# Patient Record
Sex: Male | Born: 1950
Health system: Southern US, Community
[De-identification: ages and names within clinical notes are randomized; demographics above are authoritative.]

## PROBLEM LIST (undated history)

## (undated) DIAGNOSIS — I255 Ischemic cardiomyopathy: Secondary | ICD-10-CM

## (undated) DIAGNOSIS — Z8673 Personal history of transient ischemic attack (TIA), and cerebral infarction without residual deficits: Secondary | ICD-10-CM

## (undated) DIAGNOSIS — I2109 ST elevation (STEMI) myocardial infarction involving other coronary artery of anterior wall: Secondary | ICD-10-CM

## (undated) DIAGNOSIS — I472 Ventricular tachycardia, unspecified: Secondary | ICD-10-CM

## (undated) DIAGNOSIS — I4891 Unspecified atrial fibrillation: Secondary | ICD-10-CM

## (undated) DIAGNOSIS — R570 Cardiogenic shock: Secondary | ICD-10-CM

## (undated) DIAGNOSIS — I509 Heart failure, unspecified: Secondary | ICD-10-CM

## (undated) DIAGNOSIS — E785 Hyperlipidemia, unspecified: Secondary | ICD-10-CM

## (undated) DIAGNOSIS — N200 Calculus of kidney: Secondary | ICD-10-CM

## (undated) DIAGNOSIS — I2510 Atherosclerotic heart disease of native coronary artery without angina pectoris: Secondary | ICD-10-CM

## (undated) HISTORY — DX: Ventricular tachycardia: I47.2

## (undated) HISTORY — DX: ST elevation (STEMI) myocardial infarction involving other coronary artery of anterior wall: I21.09

## (undated) HISTORY — DX: Hyperlipidemia, unspecified: E78.5

## (undated) HISTORY — DX: Calculus of kidney: N20.0

## (undated) HISTORY — DX: Ventricular tachycardia, unspecified: I47.20

## (undated) HISTORY — DX: Ischemic cardiomyopathy: I25.5

## (undated) HISTORY — DX: Heart failure, unspecified: I50.9

## (undated) HISTORY — DX: Cardiogenic shock: R57.0

## (undated) HISTORY — DX: Personal history of transient ischemic attack (TIA), and cerebral infarction without residual deficits: Z86.73

## (undated) HISTORY — DX: Unspecified atrial fibrillation: I48.91

---

## 2001-12-04 ENCOUNTER — Encounter: Admission: RE | Admit: 2001-12-04 | Discharge: 2001-12-04 | Payer: Self-pay | Admitting: Family Medicine

## 2001-12-04 ENCOUNTER — Encounter: Payer: Self-pay | Admitting: Family Medicine

## 2002-05-16 ENCOUNTER — Encounter: Payer: Self-pay | Admitting: Family Medicine

## 2002-05-16 ENCOUNTER — Encounter: Admission: RE | Admit: 2002-05-16 | Discharge: 2002-05-16 | Payer: Self-pay | Admitting: Family Medicine

## 2003-01-05 ENCOUNTER — Encounter: Admission: RE | Admit: 2003-01-05 | Discharge: 2003-01-05 | Payer: Self-pay | Admitting: Family Medicine

## 2003-01-05 ENCOUNTER — Encounter: Payer: Self-pay | Admitting: Family Medicine

## 2003-01-29 ENCOUNTER — Emergency Department (HOSPITAL_COMMUNITY): Admission: EM | Admit: 2003-01-29 | Discharge: 2003-01-29 | Payer: Self-pay | Admitting: Emergency Medicine

## 2008-08-19 ENCOUNTER — Ambulatory Visit: Payer: Self-pay | Admitting: Pulmonary Disease

## 2008-08-19 ENCOUNTER — Inpatient Hospital Stay (HOSPITAL_COMMUNITY): Admission: EM | Admit: 2008-08-19 | Discharge: 2008-08-28 | Payer: Self-pay | Admitting: Emergency Medicine

## 2008-08-19 ENCOUNTER — Ambulatory Visit: Payer: Self-pay | Admitting: Internal Medicine

## 2008-08-27 ENCOUNTER — Encounter (INDEPENDENT_AMBULATORY_CARE_PROVIDER_SITE_OTHER): Payer: Self-pay | Admitting: Internal Medicine

## 2008-09-25 ENCOUNTER — Ambulatory Visit: Payer: Self-pay | Admitting: Internal Medicine

## 2008-09-25 ENCOUNTER — Encounter (INDEPENDENT_AMBULATORY_CARE_PROVIDER_SITE_OTHER): Payer: Self-pay | Admitting: *Deleted

## 2008-09-25 ENCOUNTER — Encounter: Payer: Self-pay | Admitting: Internal Medicine

## 2008-09-25 DIAGNOSIS — R05 Cough: Secondary | ICD-10-CM

## 2008-09-25 DIAGNOSIS — E785 Hyperlipidemia, unspecified: Secondary | ICD-10-CM | POA: Insufficient documentation

## 2008-09-25 DIAGNOSIS — I2109 ST elevation (STEMI) myocardial infarction involving other coronary artery of anterior wall: Secondary | ICD-10-CM | POA: Insufficient documentation

## 2008-09-25 DIAGNOSIS — R059 Cough, unspecified: Secondary | ICD-10-CM | POA: Insufficient documentation

## 2008-09-25 DIAGNOSIS — I255 Ischemic cardiomyopathy: Secondary | ICD-10-CM | POA: Insufficient documentation

## 2008-10-16 ENCOUNTER — Telehealth (INDEPENDENT_AMBULATORY_CARE_PROVIDER_SITE_OTHER): Payer: Self-pay | Admitting: *Deleted

## 2008-10-20 ENCOUNTER — Ambulatory Visit: Payer: Self-pay | Admitting: Internal Medicine

## 2008-10-21 ENCOUNTER — Encounter: Payer: Self-pay | Admitting: Internal Medicine

## 2008-10-22 ENCOUNTER — Ambulatory Visit: Payer: Self-pay | Admitting: Internal Medicine

## 2008-10-22 DIAGNOSIS — R0602 Shortness of breath: Secondary | ICD-10-CM | POA: Insufficient documentation

## 2008-10-22 LAB — CONVERTED CEMR LAB
Basophils Relative: 0.8 % (ref 0.0–3.0)
CO2: 27 meq/L (ref 19–32)
Calcium: 9 mg/dL (ref 8.4–10.5)
Chloride: 103 meq/L (ref 96–112)
Eosinophils Absolute: 0.2 10*3/uL (ref 0.0–0.7)
Eosinophils Relative: 3.6 % (ref 0.0–5.0)
Hemoglobin: 13.2 g/dL (ref 13.0–17.0)
INR: 1 (ref 0.8–1.0)
Lymphocytes Relative: 43.6 % (ref 12.0–46.0)
MCHC: 34.2 g/dL (ref 30.0–36.0)
Neutro Abs: 2.3 10*3/uL (ref 1.4–7.7)
Neutrophils Relative %: 43.9 % (ref 43.0–77.0)
Prothrombin Time: 10.8 s — ABNORMAL LOW (ref 10.9–13.3)
RBC: 3.95 M/uL — ABNORMAL LOW (ref 4.22–5.81)
Sodium: 136 meq/L (ref 135–145)
WBC: 5.3 10*3/uL (ref 4.5–10.5)

## 2008-10-29 ENCOUNTER — Inpatient Hospital Stay (HOSPITAL_COMMUNITY): Admission: RE | Admit: 2008-10-29 | Discharge: 2008-10-30 | Payer: Self-pay | Admitting: Internal Medicine

## 2008-10-29 ENCOUNTER — Ambulatory Visit: Payer: Self-pay | Admitting: Internal Medicine

## 2008-10-30 ENCOUNTER — Encounter: Payer: Self-pay | Admitting: Internal Medicine

## 2008-10-30 HISTORY — PX: OTHER SURGICAL HISTORY: SHX169

## 2008-11-03 ENCOUNTER — Encounter: Payer: Self-pay | Admitting: Internal Medicine

## 2008-11-03 ENCOUNTER — Ambulatory Visit: Payer: Self-pay | Admitting: Internal Medicine

## 2008-11-03 ENCOUNTER — Telehealth: Payer: Self-pay | Admitting: Internal Medicine

## 2008-11-03 ENCOUNTER — Ambulatory Visit: Payer: Self-pay

## 2008-11-03 ENCOUNTER — Ambulatory Visit (HOSPITAL_COMMUNITY): Admission: RE | Admit: 2008-11-03 | Discharge: 2008-11-03 | Payer: Self-pay | Admitting: Internal Medicine

## 2008-11-05 ENCOUNTER — Telehealth: Payer: Self-pay | Admitting: Internal Medicine

## 2008-11-12 ENCOUNTER — Encounter: Payer: Self-pay | Admitting: Internal Medicine

## 2008-11-12 ENCOUNTER — Ambulatory Visit: Payer: Self-pay

## 2008-11-25 ENCOUNTER — Ambulatory Visit: Payer: Self-pay

## 2009-02-09 ENCOUNTER — Ambulatory Visit: Payer: Self-pay | Admitting: Internal Medicine

## 2009-04-21 ENCOUNTER — Encounter (INDEPENDENT_AMBULATORY_CARE_PROVIDER_SITE_OTHER): Payer: Self-pay | Admitting: *Deleted

## 2009-05-20 ENCOUNTER — Encounter: Payer: Self-pay | Admitting: Internal Medicine

## 2009-05-31 ENCOUNTER — Encounter: Payer: Self-pay | Admitting: Internal Medicine

## 2009-06-24 ENCOUNTER — Encounter: Payer: Self-pay | Admitting: Internal Medicine

## 2009-06-24 ENCOUNTER — Ambulatory Visit: Payer: Self-pay

## 2009-10-12 ENCOUNTER — Ambulatory Visit: Payer: Self-pay | Admitting: Internal Medicine

## 2009-12-30 ENCOUNTER — Telehealth (INDEPENDENT_AMBULATORY_CARE_PROVIDER_SITE_OTHER): Payer: Self-pay | Admitting: *Deleted

## 2010-01-13 ENCOUNTER — Ambulatory Visit: Payer: Self-pay | Admitting: Internal Medicine

## 2010-01-24 ENCOUNTER — Encounter: Payer: Self-pay | Admitting: Internal Medicine

## 2010-04-07 ENCOUNTER — Ambulatory Visit: Payer: Self-pay

## 2010-04-07 ENCOUNTER — Ambulatory Visit: Payer: Self-pay | Admitting: Internal Medicine

## 2010-07-07 ENCOUNTER — Encounter (INDEPENDENT_AMBULATORY_CARE_PROVIDER_SITE_OTHER): Payer: Self-pay

## 2010-07-07 ENCOUNTER — Ambulatory Visit: Admit: 2010-07-07 | Payer: Self-pay | Admitting: Internal Medicine

## 2010-07-07 ENCOUNTER — Encounter: Payer: Self-pay | Admitting: Internal Medicine

## 2010-07-07 DIAGNOSIS — I428 Other cardiomyopathies: Secondary | ICD-10-CM

## 2010-07-07 NOTE — Progress Notes (Signed)
  DDS request recieved sent to [facility]. Cala Bradford Mesiemore  December 30, 2009 12:23 PM

## 2010-07-07 NOTE — Procedures (Signed)
Summary: defib check.[email or web address]   Current Medications (verified): 1)  Zocor 80 Mg Tabs (Simvastatin) .[name].. 1 Tab Once Daily 2)  Carvedilol 12.5 Mg Tabs (Carvedilol) .... Two Times A Day 3)  Potassium Chloride Crys Cr 20 Meq Cr-Tabs (Potassium Chloride Crys Cr) .[name].. 1 Tab Once Daily 4)  Lasix 20 Mg Tabs (Furosemide) .[name].. 1 Tab Once Daily 5)  Aspirin 325 Mg Tabs (Aspirin) .... Once Daily 6)  Inspra 25 Mg Tabs (Eplerenone) .... One By Mouth Daily 7)  Benazepril Hcl 40 Mg Tabs (Benazepril Hcl) .... One By Mouth Daily  Allergies (verified): No Known Drug Allergies   ICD Specifications Following MD:  Sherryl Manges, MD     Referring MD:  [facility] ICD Vendor:  St Jude     ICD Model Number:  [id number]     ICD Serial Number:  [id number] ICD DOI:  10/29/2008     ICD Implanting MD:  Sherryl Manges, MD Research Study Name SJ4  Lead 1:    Location: RA     DOI: 10/29/2008     Model #: [id number]     Serial #: [id number]     Status: active Lead 2:    Location: RV     DOI: 10/29/2008     Model #: [id number]     Serial #: [id number]     Status: active  Indications::  VT/VF arrest   ICD Follow Up Remote Check?  No Battery Voltage:  3.20 V     Charge Time:  11.3 seconds     Battery Est. Longevity:  6.9 years Underlying rhythm:  SR ICD Dependent:  No       ICD Device Measurements Atrium:  Amplitude: 1.0 mV, Impedance: 430 ohms, Threshold: 0.5 V at 0.5 msec Right Ventricle:  Amplitude: 11.3 mV, Impedance: 460 ohms, Threshold: 0.75 V at 0.5 msec Shock Impedance: 75 ohms   Episodes MS Episodes:  0     Percent Mode Switch:  0     Coumadin:  No Shock:  0     ATP:  0     Nonsustained:  0     Atrial Pacing:  38%     Ventricular Pacing:  12%  Brady Parameters Mode DDI     Lower Rate Limit:  50     Upper Rate Limit 120 PAV 250      Tachy Zones VF:  240     VT:  200     Tech Comments:  No parameter changes.  Device function normal.  Checked by [employer] for research.  Merlin transmission in 3 months with ROV 6 months with  Dr. Graciela Husbands. Altha Harm, LPN  April 07, 2010 3:48 PM

## 2010-07-07 NOTE — Cardiovascular Report (Signed)
Summary: Office Visit   Office Visit   Imported By: Roderic Ovens 07/07/2009 13:57:52  _____________________________________________________________________  External Attachment:    Type:   Image     Comment:   External Document

## 2010-07-07 NOTE — Procedures (Signed)
Summary: rov/jml   Current Medications (verified): 1)  Zocor 80 Mg Tabs (Simvastatin) .[name].. 1 Tab Once Daily 2)  Carvedilol 6.25 Mg Tabs (Carvedilol) .... Two Times A Day 3)  Potassium Chloride Crys Cr 20 Meq Cr-Tabs (Potassium Chloride Crys Cr) .[name].. 1 Tab Once Daily 4)  Lasix 20 Mg Tabs (Furosemide) .[name].. 1 Tab Once Daily 5)  Diovan 40 Mg Tabs (Valsartan) .... Take 1/2  Tablet By Mouth Two Times A Day 6)  Aspirin 325 Mg Tabs (Aspirin) .... Once Daily  Allergies (verified): No Known Drug Allergies    ICD Specifications Following MD:  Sherryl Manges, MD     ICD Vendor:  St Jude     ICD Model Number:  [phone]     ICD Serial Number:  [id number] ICD DOI:  10/29/2008     ICD Implanting MD:  Sherryl Manges, MD  Lead 1:    Location: RA     DOI: 10/29/2008     Model #: [id number]     Serial #: [id number]     Status: active Lead 2:    Location: RV     DOI: 10/29/2008     Model #: [id number]     Serial #: [id number]     Status: active  ICD Follow Up Remote Check?  No Battery Voltage:  3.20 V     Charge Time:  11.0 seconds     Battery Est. Longevity:  7.0 years Underlying rhythm:  SR ICD Dependent:  No       ICD Device Measurements Atrium:  Amplitude: 1.8 mV, Impedance: 490 ohms, Threshold: 0.75 V at 0.4 msec Right Ventricle:  Amplitude: 11.3 mV, Impedance: 480 ohms, Threshold: 0.75 V at 0.4 msec  Episodes MS Episodes:  2     Percent Mode Switch:  <1%     Coumadin:  No Shock:  0     ATP:  0     Nonsustained:  0     Atrial Pacing:  25%     Ventricular Pacing:  2.8%  Brady Parameters Mode DDI     Lower Rate Limit:  50     Upper Rate Limit 120 PAV 250      Tachy Zones VF:  240     VT:  200     Next Remote Date:  09/20/2009     Next Cardiology Appt Due:  02/03/2010 Tech Comments:  No parameter changes.  Device function normal.  Merlin transmissions every 3 months. 2 AT/AF episodes both < 1 minute.  The patient is on Plavix.    ROV 9/11 with Dr. Graciela Husbands. Altha Harm, LPN  June 24, 2009 12:22 PM

## 2010-07-07 NOTE — Letter (Signed)
Summary: Remote Device Check  [employer], Main Office  [address]   [place], [place] [address]   Phone: [phone]  Fax: [phone]     January 24, 2010 MRN: [id number]   SAMIER JACO [address] [place], [place]  [address]   Dear Mr. Hamrick,   Your remote transmission was recieved and reviewed by your physician.  All diagnostics were within normal limits for you.   __X____Your next office visit is scheduled for: November 2011 for device check. Please call our office to schedule an appointment.    Sincerely,  Vella Kohler

## 2010-07-07 NOTE — Assessment & Plan Note (Signed)
Summary: defib check.sjm.amber   Visit Type:  Pacemaker check Referring Provider:  Eldridge Dace Primary Provider:  Dr. Arvilla Market  CC:  sob at times..no other complaints today.  History of Present Illness: [name] is seen in followup for ventricular tachycardia/fibrillation presented as an out of hospital cardiac arrest. He simply underwent intervention of an occluded LAD but had persistent left ventricular dysfunction prompting ICD implantation.  Post procedural dyspnea was relieved by reprogramming to DDI mode  The patient denieschest pain, edema or palpitations; he has had some problems with dyspneaDr. Eldridge Dace is addressing this     Current Medications (verified): 1)  Zocor 80 Mg Tabs (Simvastatin) .Mike Cole.. 1 Tab Once Daily 2)  Carvedilol 6.25 Mg Tabs (Carvedilol) .... Two Times A Day 3)  Potassium Chloride Crys Cr 20 Meq Cr-Tabs (Potassium Chloride Crys Cr) .Mike Cole.. 1 Tab Once Daily 4)  Lasix 20 Mg Tabs (Furosemide) .Mike Cole.. 1 Tab Once Daily 5)  Diovan 40 Mg Tabs (Valsartan) .... Take 1/2  Tablet By Mouth Two Times A Day 6)  Aspirin 325 Mg Tabs (Aspirin) .... Once Daily  Allergies (verified): No Known Drug Allergies  Past History:  Past Medical History: Last updated: 10/08/2009 Ventricular tachycardia arrest, resuscitated.  St. Jude CURRENT + DR Acute anterior myocardial infarction with bare-metal stent to the       left anterior descending.  Cardiogenic shock, resolved.  Ischemic cardiomyopathy, ejection fraction of 20-25%.  Dyslipidemia History of transient ischemic attack  Vital Signs:  Patient profile:   60 year old male Height:      73 inches Weight:      216 pounds BMI:     28.60 Pulse rate:   61 / minute Pulse rhythm:   regular BP sitting:   131 / 82  (left arm) Cuff size:   large  Vitals Entered By: Danielle Rankin, CMA (Oct 12, 2009 9:55 AM)  Physical Exam  General:  The patient was alert and oriented in no acute distress. HEENT Normal.  Neck veins were flat,  carotids were brisk.  Lungs were clear.  Heart sounds were regular without murmurs or gallops.  Abdomen was soft with active bowel sounds. There is no clubbing cyanosis or edema. Skin Warm and dry     ICD Specifications Following MD:  Sherryl Manges, MD     Referring MD:  [facility] ICD Vendor:  St Jude     ICD Model Number:  [id number]     ICD Serial Number:  [id number] ICD DOI:  10/29/2008     ICD Implanting MD:  Sherryl Manges, MD Research Study Name SJ4  Lead 1:    Location: RA     DOI: 10/29/2008     Model #: [id number]     Serial #: [id number]     Status: active Lead 2:    Location: RV     DOI: 10/29/2008     Model #: [id number]     Serial #: [id number]     Status: active  ICD Follow Up Remote Check?  No Battery Voltage:  3.20 V     Charge Time:  11.1 seconds     Underlying rhythm:  SR ICD Dependent:  No       ICD Device Measurements Atrium:  Amplitude: 2.0 mV, Impedance: 450 ohms, Threshold: 0.75 V at 0.5 msec Right Ventricle:  Amplitude: 11.3 mV, Impedance: 450 ohms, Threshold: 0.75 V at 0.5 msec Shock Impedance: 79 ohms   Episodes MS Episodes:  0     Coumadin:  No  Shock:  0     ATP:  0     Nonsustained:  0     Atrial Pacing:  36%     Ventricular Pacing:  11%  Brady Parameters Mode DDI     Lower Rate Limit:  50     Upper Rate Limit 120 PAV 250      Tachy Zones VF:  240     VT:  200     Next Remote Date:  01/03/2010     Next Cardiology Appt Due:  04/05/2010 Tech Comments:  checked by industry for SJ4 study. Gypsy Balsam RN BSN  Oct 12, 2009 10:29 AM   Impression & Recommendations:  Problem # 1:  IMPLANTATION OF DEFIBRILLATOR, STJ DDD (ICD-V45.02) Assessment New Device parameters and data were reviewed and no changes were made  Problem # 2:  CARDIOMYOPATHY, ISCHEMIC (ICD-414.8)  stable on current meds  His updated medication list for this problem includes:    Carvedilol 6.25 Mg Tabs (Carvedilol) .Mike Cole..Mike Cole Two times a day    Lasix 20 Mg Tabs (Furosemide) .Mike Cole... 1 tab once daily    Diovan  40 Mg Tabs (Valsartan) .Mike Cole... Take 1/2  tablet by mouth two times a day    Aspirin 325 Mg Tabs (Aspirin) ..... Once daily  His updated medication list for this problem includes:    Carvedilol 6.25 Mg Tabs (Carvedilol) .Mike Cole..Mike Cole Two times a day    Lasix 20 Mg Tabs (Furosemide) .Mike Cole... 1 tab once daily    Diovan 40 Mg Tabs (Valsartan) .Mike Cole... Take 1/2  tablet by mouth two times a day    Aspirin 325 Mg Tabs (Aspirin) ..... Once daily  Problem # 3:  VENTRICULAR TACHYCARDIA-ARREST (ICD-427.1)  no intercurrent vt

## 2010-07-08 NOTE — Cardiovascular Report (Signed)
Summary: Office Visit Remote   Office Visit Remote   Imported By: Roderic Ovens 01/25/2010 15:36:36  _____________________________________________________________________  External Attachment:    Type:   Image     Comment:   External Document

## 2010-07-25 ENCOUNTER — Encounter (INDEPENDENT_AMBULATORY_CARE_PROVIDER_SITE_OTHER): Payer: Self-pay | Admitting: *Deleted

## 2010-08-02 NOTE — Cardiovascular Report (Signed)
Summary: Office Visit Remote   Office Visit Remote   Imported By: Roderic Ovens 07/29/2010 11:17:45  _____________________________________________________________________  External Attachment:    Type:   Image     Comment:   External Document

## 2010-08-02 NOTE — Letter (Signed)
Summary: Remote Device Check  [employer], Main Office  [address]   [place], [place] [address]   Phone: [phone]  Fax: [phone]     July 25, 2010 MRN: [id number]   Mike Cole [address] [place], [place]  [address]   Dear [name],   Your remote transmission was recieved and reviewed by your physician.  All diagnostics were within normal limits for you.  ___X___Your next office visit is scheduled for:  May 2012 with Dr Graciela Husbands. Please call our office to schedule an appointment.    Sincerely,  Vella Kohler

## 2010-09-03 IMAGING — CR DG CHEST 1V PORT
1 series · 1 of 1 positions shown · non-contrast
Comparison: 08/21/2008

CLINICAL DATA: Cardiac arrest, ventilatory support

PORTABLE CHEST - 1 VIEW

[AP]
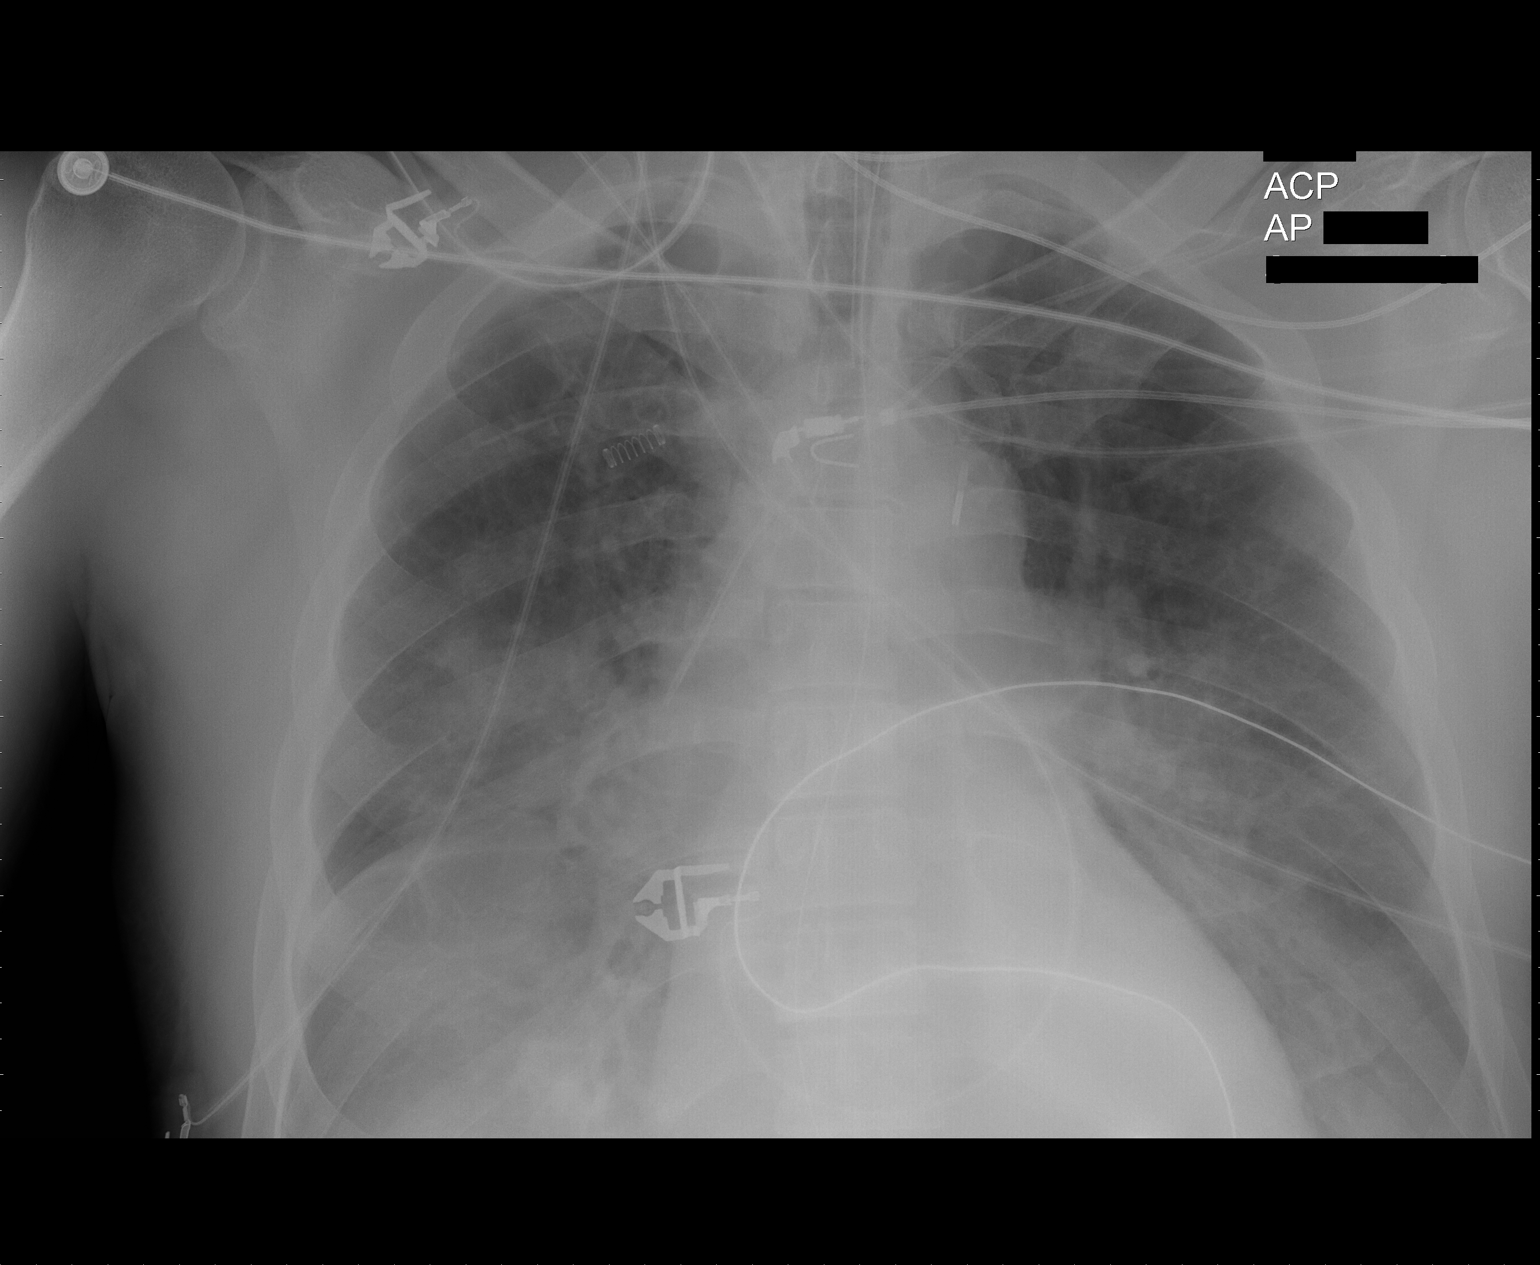

[1 of 1 positions shown; findings below may reference images not displayed]

FINDINGS: Stable support apparatus position.  No interval change.
Cardiomegaly persist with diffuse symmetric airspace disease versus
edema.  Pleural effusions are noted layering posteriorly.  No large
pneumothorax.  Chest exam is stable.
IMPRESSION: Stable airspace disease versus edema and pleural effusions.

## 2010-09-05 IMAGING — CR DG CHEST 1V PORT
1 series · 1 of 1 positions shown · non-contrast
Comparison: Portable exam 2252 hours compared to 08/23/2008

CLINICAL DATA: Cardiac arrest, Code stenting, status post
extubation, cough

PORTABLE CHEST - 1 VIEW

[AP]
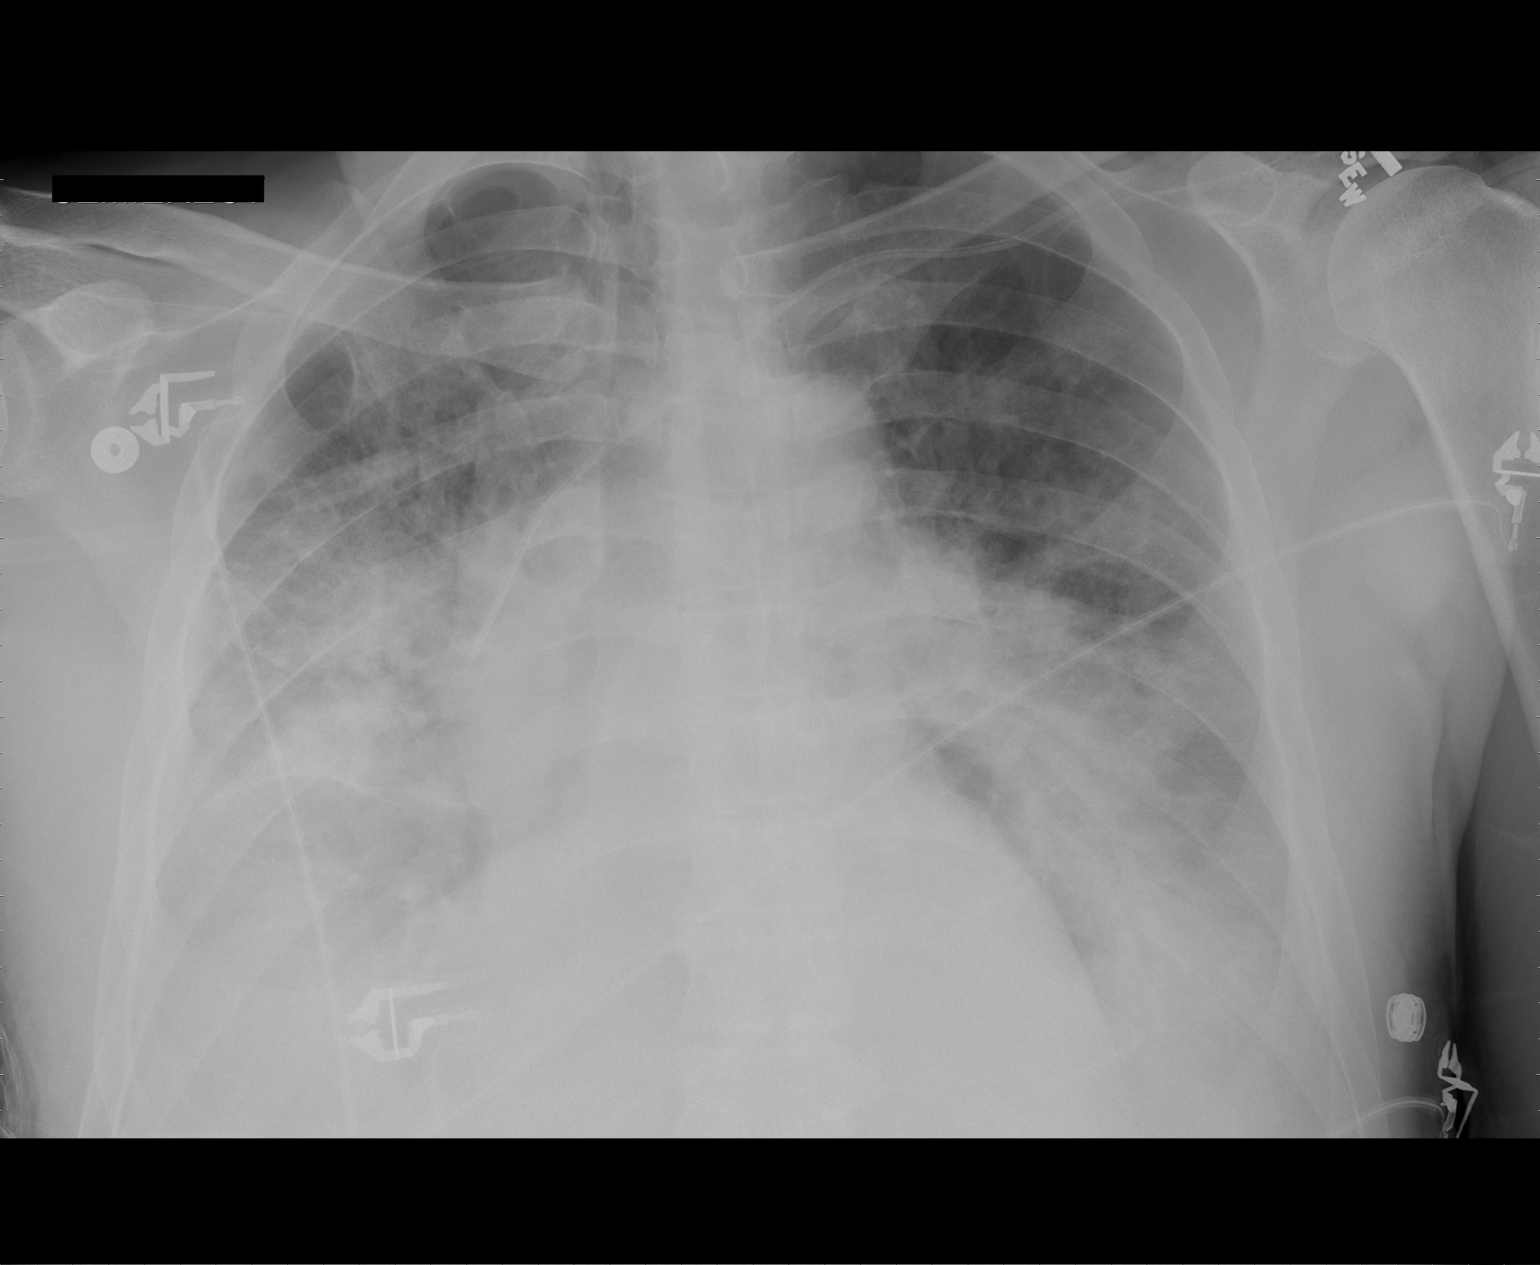

[1 of 1 positions shown; findings below may reference images not displayed]

FINDINGS: Endotracheal tube removed.
Nasogastric tube removed.
Left subclavian central venous catheter tip SVC.
Heart size stable.
Diffuse bilateral pulmonary infiltrates slightly greater on right,
question pulmonary edema versus infection.
Little interval change since prior exam.
No definite pneumothorax.
IMPRESSION: No interval change in diffuse bilateral pulmonary infiltrates.

## 2010-09-15 LAB — BASIC METABOLIC PANEL
BUN: 12 mg/dL (ref 6–23)
BUN: 12 mg/dL (ref 6–23)
BUN: 13 mg/dL (ref 6–23)
BUN: 14 mg/dL (ref 6–23)
BUN: 15 mg/dL (ref 6–23)
CO2: 17 mEq/L — ABNORMAL LOW (ref 19–32)
CO2: 19 mEq/L (ref 19–32)
CO2: 20 mEq/L (ref 19–32)
CO2: 20 mEq/L (ref 19–32)
CO2: 20 mEq/L (ref 19–32)
CO2: 20 mEq/L (ref 19–32)
CO2: 22 mEq/L (ref 19–32)
CO2: 22 mEq/L (ref 19–32)
CO2: 23 mEq/L (ref 19–32)
CO2: 24 mEq/L (ref 19–32)
CO2: 25 mEq/L (ref 19–32)
CO2: 26 mEq/L (ref 19–32)
Calcium: 7.3 mg/dL — ABNORMAL LOW (ref 8.4–10.5)
Calcium: 7.4 mg/dL — ABNORMAL LOW (ref 8.4–10.5)
Calcium: 7.5 mg/dL — ABNORMAL LOW (ref 8.4–10.5)
Calcium: 7.5 mg/dL — ABNORMAL LOW (ref 8.4–10.5)
Calcium: 7.5 mg/dL — ABNORMAL LOW (ref 8.4–10.5)
Calcium: 7.5 mg/dL — ABNORMAL LOW (ref 8.4–10.5)
Calcium: 7.6 mg/dL — ABNORMAL LOW (ref 8.4–10.5)
Calcium: 7.6 mg/dL — ABNORMAL LOW (ref 8.4–10.5)
Calcium: 8 mg/dL — ABNORMAL LOW (ref 8.4–10.5)
Chloride: 102 mEq/L (ref 96–112)
Chloride: 105 mEq/L (ref 96–112)
Chloride: 106 mEq/L (ref 96–112)
Chloride: 106 mEq/L (ref 96–112)
Chloride: 107 mEq/L (ref 96–112)
Chloride: 108 mEq/L (ref 96–112)
Chloride: 109 mEq/L (ref 96–112)
Chloride: 109 mEq/L (ref 96–112)
Chloride: 109 mEq/L (ref 96–112)
Chloride: 110 mEq/L (ref 96–112)
Chloride: 114 mEq/L — ABNORMAL HIGH (ref 96–112)
Creatinine, Ser: 0.92 mg/dL (ref 0.4–1.5)
Creatinine, Ser: 1 mg/dL (ref 0.4–1.5)
Creatinine, Ser: 1.03 mg/dL (ref 0.4–1.5)
Creatinine, Ser: 1.04 mg/dL (ref 0.4–1.5)
Creatinine, Ser: 1.1 mg/dL (ref 0.4–1.5)
Creatinine, Ser: 1.12 mg/dL (ref 0.4–1.5)
Creatinine, Ser: 1.12 mg/dL (ref 0.4–1.5)
Creatinine, Ser: 1.16 mg/dL (ref 0.4–1.5)
GFR calc Af Amer: >60 mL/min (ref >60)
GFR calc Af Amer: >60 mL/min (ref >60)
GFR calc Af Amer: >60 mL/min (ref >60)
GFR calc Af Amer: >60 mL/min (ref >60)
GFR calc Af Amer: >60 mL/min (ref >60)
GFR calc Af Amer: >60 mL/min (ref >60)
GFR calc Af Amer: >60 mL/min (ref >60)
GFR calc Af Amer: >60 mL/min (ref >60)
GFR calc Af Amer: >60 mL/min (ref >60)
GFR calc Af Amer: >60 mL/min (ref >60)
GFR calc Af Amer: >60 mL/min (ref >60)
GFR calc non Af Amer: >60 mL/min (ref >60)
GFR calc non Af Amer: >60 mL/min (ref >60)
GFR calc non Af Amer: >60 mL/min (ref >60)
GFR calc non Af Amer: >60 mL/min (ref >60)
GFR calc non Af Amer: >60 mL/min (ref >60)
Glucose, Bld: 105 mg/dL — ABNORMAL HIGH (ref 70–99)
Glucose, Bld: 107 mg/dL — ABNORMAL HIGH (ref 70–99)
Glucose, Bld: 189 mg/dL — ABNORMAL HIGH (ref 70–99)
Glucose, Bld: 191 mg/dL — ABNORMAL HIGH (ref 70–99)
Glucose, Bld: 208 mg/dL — ABNORMAL HIGH (ref 70–99)
Glucose, Bld: 229 mg/dL — ABNORMAL HIGH (ref 70–99)
Glucose, Bld: 230 mg/dL — ABNORMAL HIGH (ref 70–99)
Glucose, Bld: 243 mg/dL — ABNORMAL HIGH (ref 70–99)
Glucose, Bld: 90 mg/dL (ref 70–99)
Glucose, Bld: 95 mg/dL (ref 70–99)
Potassium: 3.2 mEq/L — ABNORMAL LOW (ref 3.5–5.1)
Potassium: 3.4 mEq/L — ABNORMAL LOW (ref 3.5–5.1)
Potassium: 3.4 mEq/L — ABNORMAL LOW (ref 3.5–5.1)
Potassium: 3.4 mEq/L — ABNORMAL LOW (ref 3.5–5.1)
Potassium: 3.5 mEq/L (ref 3.5–5.1)
Potassium: 3.8 mEq/L (ref 3.5–5.1)
Potassium: 4.4 mEq/L (ref 3.5–5.1)
Potassium: 5.2 mEq/L — ABNORMAL HIGH (ref 3.5–5.1)
Potassium: 5.2 mEq/L — ABNORMAL HIGH (ref 3.5–5.1)
Sodium: 128 mEq/L — ABNORMAL LOW (ref 135–145)
Sodium: 131 mEq/L — ABNORMAL LOW (ref 135–145)
Sodium: 132 mEq/L — ABNORMAL LOW (ref 135–145)
Sodium: 132 mEq/L — ABNORMAL LOW (ref 135–145)
Sodium: 135 mEq/L (ref 135–145)
Sodium: 135 mEq/L (ref 135–145)
Sodium: 136 mEq/L (ref 135–145)
Sodium: 136 mEq/L (ref 135–145)
Sodium: 138 mEq/L (ref 135–145)
Sodium: 138 mEq/L (ref 135–145)
Sodium: 142 mEq/L (ref 135–145)
Sodium: 142 mEq/L (ref 135–145)

## 2010-09-15 LAB — COMPREHENSIVE METABOLIC PANEL
ALT: 143 U/L — ABNORMAL HIGH (ref 0–53)
ALT: 230 U/L — ABNORMAL HIGH (ref 0–53)
AST: 163 U/L — ABNORMAL HIGH (ref 0–37)
AST: 290 U/L — ABNORMAL HIGH (ref 0–37)
Albumin: 2.3 g/dL — ABNORMAL LOW (ref 3.5–5.2)
Alkaline Phosphatase: 98 U/L (ref 39–117)
CO2: 18 mEq/L — ABNORMAL LOW (ref 19–32)
CO2: 24 mEq/L (ref 19–32)
Calcium: 7.3 mg/dL — ABNORMAL LOW (ref 8.4–10.5)
Calcium: 8.4 mg/dL (ref 8.4–10.5)
GFR calc Af Amer: >60 mL/min (ref >60)
GFR calc Af Amer: >60 mL/min (ref >60)
GFR calc non Af Amer: 51 mL/min — ABNORMAL LOW (ref >60)
GFR calc non Af Amer: >60 mL/min (ref >60)
Glucose, Bld: 308 mg/dL — ABNORMAL HIGH (ref 70–99)
Potassium: 4.2 mEq/L (ref 3.5–5.1)
Sodium: 129 mEq/L — ABNORMAL LOW (ref 135–145)
Sodium: 137 mEq/L (ref 135–145)
Total Protein: 4.2 g/dL — ABNORMAL LOW (ref 6.0–8.3)

## 2010-09-15 LAB — BLOOD GAS, ARTERIAL
Acid-base deficit: 3.2 mmol/L — ABNORMAL HIGH (ref 0.0–2.0)
Acid-base deficit: 3.8 mmol/L — ABNORMAL HIGH (ref 0.0–2.0)
Drawn by: 296031
FIO2: 0.4 %
FIO2: 0.4 %
MECHVT: 550 mL
MECHVT: 550 mL
MECHVT: 550 mL
O2 Saturation: 95.7 %
O2 Saturation: 99.3 %
PEEP: 5 cmH2O
PEEP: 5 cmH2O
PEEP: 8 cmH2O
Patient temperature: 91.4
RATE: 18 resp/min
RATE: 18 resp/min
TCO2: 19.9 mmol/L (ref 0–100)
pCO2 arterial: 29.3 mmHg — ABNORMAL LOW (ref 35.0–45.0)
pCO2 arterial: 32.1 mmHg — ABNORMAL LOW (ref 35.0–45.0)
pH, Arterial: 7.37 (ref 7.350–7.450)
pO2, Arterial: 74.4 mmHg — ABNORMAL LOW (ref 80.0–100.0)

## 2010-09-15 LAB — MAGNESIUM
Magnesium: 1.5 mg/dL (ref 1.5–2.5)
Magnesium: 1.6 mg/dL (ref 1.5–2.5)
Magnesium: 1.6 mg/dL (ref 1.5–2.5)
Magnesium: 1.7 mg/dL (ref 1.5–2.5)
Magnesium: 1.8 mg/dL (ref 1.5–2.5)
Magnesium: 2 mg/dL (ref 1.5–2.5)
Magnesium: 2 mg/dL (ref 1.5–2.5)
Magnesium: 2.1 mg/dL (ref 1.5–2.5)
Magnesium: 2.4 mg/dL (ref 1.5–2.5)

## 2010-09-15 LAB — POCT I-STAT 3, ART BLOOD GAS (G3+)
Acid-base deficit: 6 mmol/L — ABNORMAL HIGH (ref 0.0–2.0)
Bicarbonate: 17.7 mEq/L — ABNORMAL LOW (ref 20.0–24.0)
O2 Saturation: 99 %
Patient temperature: 97
TCO2: 19 mmol/L (ref 0–100)
pCO2 arterial: 36.3 mmHg (ref 35.0–45.0)
pH, Arterial: 7.26 — ABNORMAL LOW (ref 7.350–7.450)

## 2010-09-15 LAB — CBC
HCT: 28.2 % — ABNORMAL LOW (ref 39.0–52.0)
HCT: 28.3 % — ABNORMAL LOW (ref 39.0–52.0)
HCT: 30.2 % — ABNORMAL LOW (ref 39.0–52.0)
HCT: 31.9 % — ABNORMAL LOW (ref 39.0–52.0)
HCT: 33.7 % — ABNORMAL LOW (ref 39.0–52.0)
HCT: 36.1 % — ABNORMAL LOW (ref 39.0–52.0)
HCT: 43.5 % (ref 39.0–52.0)
HCT: 44 % (ref 39.0–52.0)
Hemoglobin: 10 g/dL — ABNORMAL LOW (ref 13.0–17.0)
Hemoglobin: 10.5 g/dL — ABNORMAL LOW (ref 13.0–17.0)
Hemoglobin: 11.2 g/dL — ABNORMAL LOW (ref 13.0–17.0)
Hemoglobin: 11.7 g/dL — ABNORMAL LOW (ref 13.0–17.0)
Hemoglobin: 15.1 g/dL (ref 13.0–17.0)
Hemoglobin: 15.3 g/dL (ref 13.0–17.0)
Hemoglobin: 9.9 g/dL — ABNORMAL LOW (ref 13.0–17.0)
MCHC: 34.3 g/dL (ref 30.0–36.0)
MCHC: 34.7 g/dL (ref 30.0–36.0)
MCHC: 34.7 g/dL (ref 30.0–36.0)
MCHC: 34.9 g/dL (ref 30.0–36.0)
MCHC: 34.9 g/dL (ref 30.0–36.0)
MCHC: 35.2 g/dL (ref 30.0–36.0)
MCHC: 35.4 g/dL (ref 30.0–36.0)
MCHC: 35.5 g/dL (ref 30.0–36.0)
MCV: 95.9 fL (ref 78.0–100.0)
MCV: 97 fL (ref 78.0–100.0)
MCV: 97.5 fL (ref 78.0–100.0)
MCV: 97.9 fL (ref 78.0–100.0)
MCV: 98.1 fL (ref 78.0–100.0)
Platelets: 101 10*3/uL — ABNORMAL LOW (ref 150–400)
Platelets: 143 10*3/uL — ABNORMAL LOW (ref 150–400)
Platelets: 207 10*3/uL (ref 150–400)
RBC: 3.11 MIL/uL — ABNORMAL LOW (ref 4.22–5.81)
RBC: 3.19 MIL/uL — ABNORMAL LOW (ref 4.22–5.81)
RBC: 3.33 MIL/uL — ABNORMAL LOW (ref 4.22–5.81)
RBC: 3.98 MIL/uL — ABNORMAL LOW (ref 4.22–5.81)
RBC: 4.49 MIL/uL (ref 4.22–5.81)
RBC: 4.49 MIL/uL (ref 4.22–5.81)
RDW: 13.4 % (ref 11.5–15.5)
RDW: 13.5 % (ref 11.5–15.5)
RDW: 13.6 % (ref 11.5–15.5)
RDW: 13.6 % (ref 11.5–15.5)
RDW: 13.6 % (ref 11.5–15.5)
RDW: 13.6 % (ref 11.5–15.5)
RDW: 13.6 % (ref 11.5–15.5)
RDW: 13.9 % (ref 11.5–15.5)
WBC: 10.1 10*3/uL (ref 4.0–10.5)
WBC: 10.4 10*3/uL (ref 4.0–10.5)
WBC: 10.7 10*3/uL — ABNORMAL HIGH (ref 4.0–10.5)
WBC: 12.4 10*3/uL — ABNORMAL HIGH (ref 4.0–10.5)
WBC: 9.4 10*3/uL (ref 4.0–10.5)

## 2010-09-15 LAB — URINE CULTURE: Colony Count: >=100000

## 2010-09-15 LAB — URINALYSIS, ROUTINE W REFLEX MICROSCOPIC
Glucose, UA: NEGATIVE mg/dL
Ketones, ur: NEGATIVE mg/dL
Leukocytes, UA: NEGATIVE
Protein, ur: NEGATIVE mg/dL

## 2010-09-15 LAB — CARDIAC PANEL(CRET KIN+CKTOT+MB+TROPI)
CK, MB: 438.3 ng/mL — ABNORMAL HIGH (ref 0.3–4.0)
Total CK: 3030 U/L — ABNORMAL HIGH (ref 7–232)

## 2010-09-15 LAB — HEPARIN LEVEL (UNFRACTIONATED)
Heparin Unfractionated: 0.11 IU/mL — ABNORMAL LOW (ref 0.30–0.70)
Heparin Unfractionated: 0.29 IU/mL — ABNORMAL LOW (ref 0.30–0.70)
Heparin Unfractionated: 0.32 IU/mL (ref 0.30–0.70)

## 2010-09-15 LAB — CULTURE, BLOOD (ROUTINE X 2)

## 2010-09-15 LAB — DIFFERENTIAL
Basophils Relative: 1 % (ref 0–1)
Eosinophils Absolute: 0.2 10*3/uL (ref 0.0–0.7)
Eosinophils Relative: 2 % (ref 0–5)
Lymphs Abs: 4.4 10*3/uL — ABNORMAL HIGH (ref 0.7–4.0)
Monocytes Relative: 4 % (ref 3–12)
Neutrophils Relative %: 47 % (ref 43–77)

## 2010-09-15 LAB — GLUCOSE, CAPILLARY
Glucose-Capillary: 100 mg/dL — ABNORMAL HIGH (ref 70–99)
Glucose-Capillary: 102 mg/dL — ABNORMAL HIGH (ref 70–99)
Glucose-Capillary: 108 mg/dL — ABNORMAL HIGH (ref 70–99)
Glucose-Capillary: 148 mg/dL — ABNORMAL HIGH (ref 70–99)
Glucose-Capillary: 183 mg/dL — ABNORMAL HIGH (ref 70–99)
Glucose-Capillary: 185 mg/dL — ABNORMAL HIGH (ref 70–99)
Glucose-Capillary: 190 mg/dL — ABNORMAL HIGH (ref 70–99)
Glucose-Capillary: 190 mg/dL — ABNORMAL HIGH (ref 70–99)
Glucose-Capillary: 198 mg/dL — ABNORMAL HIGH (ref 70–99)
Glucose-Capillary: 210 mg/dL — ABNORMAL HIGH (ref 70–99)
Glucose-Capillary: 225 mg/dL — ABNORMAL HIGH (ref 70–99)
Glucose-Capillary: 227 mg/dL — ABNORMAL HIGH (ref 70–99)
Glucose-Capillary: 58 mg/dL — ABNORMAL LOW (ref 70–99)
Glucose-Capillary: 63 mg/dL — ABNORMAL LOW (ref 70–99)
Glucose-Capillary: 63 mg/dL — ABNORMAL LOW (ref 70–99)
Glucose-Capillary: 66 mg/dL — ABNORMAL LOW (ref 70–99)
Glucose-Capillary: 66 mg/dL — ABNORMAL LOW (ref 70–99)
Glucose-Capillary: 68 mg/dL — ABNORMAL LOW (ref 70–99)
Glucose-Capillary: 72 mg/dL (ref 70–99)
Glucose-Capillary: 72 mg/dL (ref 70–99)
Glucose-Capillary: 75 mg/dL (ref 70–99)
Glucose-Capillary: 79 mg/dL (ref 70–99)
Glucose-Capillary: 79 mg/dL (ref 70–99)
Glucose-Capillary: 88 mg/dL (ref 70–99)
Glucose-Capillary: 93 mg/dL (ref 70–99)
Glucose-Capillary: 94 mg/dL (ref 70–99)
Glucose-Capillary: 97 mg/dL (ref 70–99)

## 2010-09-15 LAB — PHOSPHORUS
Phosphorus: 1.7 mg/dL — ABNORMAL LOW (ref 2.3–4.6)
Phosphorus: 1.8 mg/dL — ABNORMAL LOW (ref 2.3–4.6)
Phosphorus: 2.2 mg/dL — ABNORMAL LOW (ref 2.3–4.6)
Phosphorus: 4 mg/dL (ref 2.3–4.6)
Phosphorus: 4.7 mg/dL — ABNORMAL HIGH (ref 2.3–4.6)

## 2010-09-15 LAB — APTT
aPTT: 47 seconds — ABNORMAL HIGH (ref 24–37)
aPTT: 73 seconds — ABNORMAL HIGH (ref 24–37)

## 2010-09-15 LAB — CULTURE, BAL-QUANTITATIVE W GRAM STAIN: Colony Count: >=100000

## 2010-09-15 LAB — PROTIME-INR
INR: 1 (ref 0.00–1.49)
INR: 1.2 (ref 0.00–1.49)
Prothrombin Time: 13.7 seconds (ref 11.6–15.2)
Prothrombin Time: 15.1 seconds (ref 11.6–15.2)

## 2010-09-15 LAB — URINE MICROSCOPIC-ADD ON

## 2010-09-15 LAB — POCT CARDIAC MARKERS
Myoglobin, poc: >500 ng/mL (ref 12–200)
Troponin i, poc: <0.05 ng/mL (ref 0.00–0.09)

## 2010-10-14 ENCOUNTER — Encounter: Payer: Self-pay | Admitting: Internal Medicine

## 2010-10-17 ENCOUNTER — Encounter (INDEPENDENT_AMBULATORY_CARE_PROVIDER_SITE_OTHER): Payer: Self-pay

## 2010-10-17 ENCOUNTER — Encounter: Payer: Self-pay | Admitting: Internal Medicine

## 2010-10-17 ENCOUNTER — Ambulatory Visit (INDEPENDENT_AMBULATORY_CARE_PROVIDER_SITE_OTHER): Payer: Self-pay | Admitting: Internal Medicine

## 2010-10-17 DIAGNOSIS — Z95810 Presence of automatic (implantable) cardiac defibrillator: Secondary | ICD-10-CM

## 2010-10-17 DIAGNOSIS — I472 Ventricular tachycardia, unspecified: Secondary | ICD-10-CM

## 2010-10-17 DIAGNOSIS — I4729 Other ventricular tachycardia: Secondary | ICD-10-CM

## 2010-10-17 DIAGNOSIS — R0989 Other specified symptoms and signs involving the circulatory and respiratory systems: Secondary | ICD-10-CM

## 2010-10-17 DIAGNOSIS — I2589 Other forms of chronic ischemic heart disease: Secondary | ICD-10-CM

## 2010-10-17 DIAGNOSIS — I428 Other cardiomyopathies: Secondary | ICD-10-CM

## 2010-10-17 NOTE — Assessment & Plan Note (Signed)
No recurrent ventricular tachycardia 

## 2010-10-17 NOTE — Patient Instructions (Signed)
Your physician wants you to follow-up in: 6 months with Paula/ Belenda Cruise for a device check. You will receive a reminder letter in the mail two months in advance. If you don't receive a letter, please call our office to schedule the follow-up appointment.  Your physician recommends that you continue on your current medications as directed. Please refer to the Current Medication list given to you today.

## 2010-10-17 NOTE — Assessment & Plan Note (Signed)
The patient's device was interrogated.  The information was reviewed. No changes were made in the programming.    

## 2010-10-17 NOTE — Progress Notes (Signed)
  HPI  Mike Cole is a 60 y.o. male seen in followup for ventricular tachycardia/fibrillation presented as an out of hospital cardiac arrest. He simply underwent intervention of an occluded LAD but had persistent left ventricular dysfunction prompting ICD implantation.  Post procedural dyspnea was relieved by reprogramming to DDI mode  The patient denieschest pain, edema or palpitations; he has had some problems with dyspneaDr. Eldridge Dace is addressing this  Soreness the device pocket has resolved  Past Medical History  Diagnosis Date  . Ventricular tachycardia     arrest, resuscitated  . Acute anterior myocardial infarction     BMS to the LAD  . Cardiogenic shock     resolved  . Ischemic cardiomyopathy     EF of 20-25%  . Dyslipidemia   . History of transient ischemic attack     Past Surgical History  Procedure Date  . St jude current + dr 10/30/2008    Current Outpatient Prescriptions  Medication Sig Dispense Refill  . aspirin 325 MG tablet Take 325 mg by mouth daily.        . benazepril (LOTENSIN) 40 MG tablet Take 40 mg by mouth daily.        . carvedilol (COREG) 12.5 MG tablet Take 12.5 mg by mouth 2 (two) times daily with a meal.        . furosemide (LASIX) 20 MG tablet Take 20 mg by mouth daily.        . potassium chloride SA (K-DUR,KLOR-CON) 20 MEQ tablet Take 20 mEq by mouth daily.        . simvastatin (ZOCOR) 80 MG tablet Take 80 mg by mouth daily.        [name] eplerenone (INSPRA) 25 MG tablet Take 25 mg by mouth daily.          No Known Allergies  Review of Systems negative except from HPI and PMH  Physical Exam Well developed and well nourished in no acute distress HENT normal E scleral and icterus clear Neck Supple Device pocket was well healed JVP flat; carotids brisk and full Clear to ausculation Regular rate and rhythm, no murmurs gallops or rub Soft with active bowel sounds No clubbing cyanosis and edema Alert and oriented, grossly normal motor  and sensory function Skin Warm and Dry   Assessment and  Plan

## 2010-10-17 NOTE — Assessment & Plan Note (Signed)
stable °

## 2010-10-18 NOTE — Op Note (Signed)
NAME:  MONTREY, BUIST NO.:  [id number]   MEDICAL RECORD NO.:  [id number]          PATIENT TYPE:  INP   LOCATION:  4703                         FACILITY:  [facility]   PHYSICIAN:  Duke Salvia, MD, FACCDATE OF BIRTH:  12-23-1950   DATE OF PROCEDURE:  10/29/2008  DATE OF DISCHARGE:                               OPERATIVE REPORT   PREOPERATIVE DIAGNOSIS:  Ischemic cardiomyopathy with prior myocardial  infarction complicated by ventricular fibrillation with persistent left  ventricular dysfunction.   POSTOPERATIVE DIAGNOSIS:  Ischemic cardiomyopathy with prior myocardial  infarction complicated by ventricular fibrillation with persistent left  ventricular dysfunction; bradycardia.   PROCEDURE:  Dual-chamber defibrillator implantation with intraoperative  defibrillation threshold testing.   Following obtaining informed consent, the patient was brought to the  electrophysiology laboratory and placed on the fluoroscopic table in  supine position.  After routine prep and drape of the left upper chest,  lidocaine was infiltrated in the prepectoral and subclavicular region.  An incision was made and carried down to layer of the prepectoral fascia  using electrocautery and sharp dissection.  A pocket was performed  similarly and it was created somewhat medially as he wanted to be able  to do do push-ups.  Hemostasis was obtained over time.  Subsequently, 2  venipunctures were accomplished.  Guidewires were placed and retained  and an 8-French and 7-French sheath were placed through which were  passed a St. Jude Durata [phone] single coil defibrillator lead, serial  number [id number] and a 2080T 52-cm active-fixation atrial lead, serial  number [id number].  Under fluoroscopic guidance, these were manipulated  to the right ventricular apex and the right atrial appendage  respectively where the bipolar R-wave was 9.1 with a pace impedance of  634, threshold 0.9 volts at 0.5  milliseconds.  Current threshold 1.4 mA.  There was no diaphragmatic pacing at 10 volts.  The current of injury  was brisk.   The bipolar P-wave was 3.2 with a pace impedance of 631 ohms, and  threshold of 1 volt at 0.5 milliseconds.  Current threshold 1.7 mA.  Again, there was no diaphragmatic pacing at 10 volts and the current of  injury was brisk.  These leads were secured to the prepectoral fascia  and hemostatic suture was also applied.  The leads were then attached to  a Current Plus DR [id number] defibrillator, serial number [id number] (this is  an SJ4 system). Through the device, the bipolar P-wave was 10.8 with  pace impedance of 540 ohms, and threshold 0.5 at 0.5.  The P-wave was  1.2 with a pace impedance of 450 and threshold 1.2 at 0.5.   The high-voltage impedance was 62 ohms.   At this point, defibrillation threshold testing was undertaken.  Ventricular fibrillation was induced via T-wave shock.  After a total  duration of 6 seconds, a 15-joule shock was delivered through a measured  resistance of 55 ohms terminating ventricular fibrillation and restoring  sinus rhythm.  The device was implanted.  The pocket was copiously  irrigated with antibiotic-containing saline solution.  Hemostasis was  assured.  The leads and  pulse generator were placed in the pocket  somewhat medially and secured.  Surgicel was placed in the lateral  aspect of the cephalad aspect of the pocket.  The wound was closed in 3  layers in normal fashion.  The wound was washed and  dried and a benzoin and Steri-Strip dressing with an Elastoplast  compression dressing were applied.  Needle counts, sponge counts, and  instrument counts were correct at the end of procedure according to  staff.  The patient tolerated the procedure without apparent  complication.      Duke Salvia, MD, [facility]  Electronically Signed     SCK/MEDQ  D:  10/29/2008  T:  10/30/2008  Job:  [id number]   cc:   Corky Crafts,  MD

## 2010-10-18 NOTE — Discharge Summary (Signed)
NAME:  Mike Cole, Mike Cole NO.:  [id number]   MEDICAL RECORD NO.:  [id number]          PATIENT TYPE:  INP   LOCATION:  4703                         FACILITY:  [facility]   PHYSICIAN:  Duke Salvia, MD, FACCDATE OF BIRTH:  1950-06-24   DATE OF ADMISSION:  10/29/2008  DATE OF DISCHARGE:  10/30/2008                               DISCHARGE SUMMARY   The patient has no known drug allergies.   Time for this dictation, examination and explanation with the patient  greater than 40 minutes.   FINAL DIAGNOSIS:  Discharging day 1 status post implant of a St. Jude  CURRENT + DR dual-chamber cardioverter defibrillator (defibrillator  threshold study less than or equal to 20 joules).   SECONDARY DIAGNOSES:  1. History of ventricular tachycardia/ventricular fibrillation out of      hospital arrest.  2. STEMI - anterolateral myocardial infarction.  3. Emergent catheterization August 19, 2008.      a.     Left main without significant disease.      b.     Right coronary artery 25% proximal stenosis.      c.     Left circumflex 25% proximal stenosis, obtuse marginal 1 25%       proximal stenosis.      d.     Left anterior descending 100% occluded proximally status       post percutaneous transluminal coronary angioplasty/thrombectomy/       Zeta stent (bare metal stent).      e.     Ejection fraction 20-25%.      f.     Intra-aortic balloon pump for cardiogenic shock.      g.     Intubated in the emergency room.      h.     Hypothermia protocol.      i.     A full neurologic recovery.  4. Dyslipidemia.  5. History of transient ischemic attack.  6. Life vest worn at discharge from [facility] September 09, 2008.  7. Followup echocardiogram after the heart attack and      revascularization shows that his ejection fraction is not      rebounding past the 30-35% range.   PROCEDURE:  Oct 29, 2008 implant of a St. Jude dual-chamber cardioverter-  defibrillator as dictated above by  Dr. Sherryl Manges with defibrillator  threshold study less than or equal to 20 joules.  The patient has had no  postprocedural complications.  No hematoma.  The leads are in  appropriate position on a chest x-ray without pneumothorax.  The device  has been interrogated and all values are within normal limits.   St. Jude representative has talked to the patient about EMI at the work  place.  The patient when Mike Cole is scheduled to go back to work will call  the office of [facility].  [employer] will send representatives  out to monitor sources of EMI for the patient.   BRIEF HISTORY:  Mike Cole is a 60 year old male who presented with a ST  elevation anterior myocardial infarction August 19, 2008.  Mike Cole had an out  of hospital VT VF arrest.  Mike Cole was found to have a 100% occluded LAD in  the Cath Lab.  Mike Cole had been intubated and started on hypothymia protocol  prior to the emergent catheterization.  Mike Cole has made a full neurologic  recovery.  The LAD required thrombectomy, PTCA and Zeta bare-metal  stent.  The patient is on aspirin and Plavix.  Mike Cole went home from the  hospital on September 09, 2008.  Mike Cole was wearing a life vest and has worn one  up until this date which is Oct 20, 2008.  Mike Cole has had a repeat  echocardiogram.  His ejection fraction has not rebounded above 35%.  Mike Cole  then will be scheduled for cardioverter-defibrillator.   HOSPITAL COURSE:  The patient presents electively on Oct 29, 2008.  Mike Cole  underwent implant of the St. Jude dual-chamber device by Dr. Graciela Husbands.  Mike Cole  was ready for discharge postprocedure day #1.  Instruction and the  mobility of the left arm and incision care has been given to the  patient.  Mike Cole was asked to keep his incision dry for the next 7 days, to  sponge bathe until Thursday November 05, 2008.  Mike Cole goes home with a  prescription for Percocet 5/325 1-2 every 4-6 hours as needed for pain.  His other medications include:  1. Plavix 75 mg daily.  2. Enteric-coated aspirin 325  mg daily.  3. Lasix 20 mg daily.  4. K-Dur 20 mEq daily.  5. Diovan 40 mg tablets one half tablet daily.  6. Coreg 3.125 mg twice daily.  7. Zocor 40 mg daily at bedtime.  8. Doxycycline 100 mg twice daily.   Mike Cole follows up with [facility] [address]  [place] at [facility], Thursday, November 12, 2008 at 11:30.  Mike Cole is  to call Dr. Hoyle Barr office for 69-month followup for re-interrogation  of the device and once again Mike Cole knows to call Amber at [phone] when Mike Cole  gets back to work for a [employer]. 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   Laboratory studies pertinent to this admission were drawn on Oct 22, 2008.  Protime 10.8, INR is 1, PTT is 31.8, white cells 5.3, hemoglobin  13.2, hematocrit 38.5, platelets 159.  Serum electrolytes sodium 136,  potassium 4.2, chloride 103, carbonate 27, glucose 87, BUN is 21, and  creatinine 1.1.      Maple Mirza, [place]      Duke Salvia, MD, [facility]  Electronically Signed    GM/MEDQ  D:  10/30/2008  T:  10/31/2008  Job:  [id number]   cc:   Corky Crafts, MD

## 2010-10-18 NOTE — H&P (Signed)
NAME:  DYSHON, PHILBIN NO.:  [id number]   MEDICAL RECORD NO.:  [id number]          PATIENT TYPE:  INP   LOCATION:  1826                         FACILITY:  [facility]   PHYSICIAN:  Felipa Evener, MD  DATE OF BIRTH:  27-Dec-1950   DATE OF ADMISSION:  08/19/2008  DATE OF DISCHARGE:                              HISTORY & PHYSICAL   The patient is a 60 year old male with no known past medical history,  however, he does have a cardiologist by the name of Dr. Amil Amen of  [facility] who was found down by the family earlier today.  Chest compressions were initiated by the family and EMS was called.  The  King tube was introduced in the patient as EMS was having difficulty  intubating  him and brought to emergency department.  The patient was  shocked 5 times as well as amiodarone drip was started for ventricular  tachycardia and ventricular fibrillation.  Then spontaneous circulation  was obtained and blood pressure was somewhat hypertensive and pressor  dependant.  EKG showed ST segment elevation in lateral leads and  Cardiology was called stat for code STEMI, taken the patient to the cath  lab.   PAST MEDICAL HISTORY:  Unknown.   MEDICATIONS:  Unknown.   ALLERGIES:  Unknown.   FAMILY HISTORY:  Unknown.   SOCIAL HISTORY:  Unknown.   REVIEW OF SYSTEMS:  A 12-point review of systems is unobtainable as the  patient is intubated.   PHYSICAL EXAMINATION:  GENERAL:  Well-appearing, well-developed male  sedated and intubated.  VITAL SIGNS:  He has a heart rate of 100 that appears sinus at that  point, blood pressure was 79/48.  Initiation of pressure saturation 100%  on PRBC of 18, tidal volume of 550,  PEEP of 5, and FiO2 of 100%.  Respiratory rate 18 as mentioned above.  The patient is not breathing  over the ventilator.  HEENT:  Normocephalic and atraumatic.  Pupils were equal, round, and  reactive to light.  Extraocular movements are intact.  The oral  and  nasal mucosa are within normal limits.  NECK:  No thyromegaly.  No lymphadenopathy.  Normal jugular reflex  appreciated.  On patient's left side, there is a  hematoma consistent  with a carotid stick and that reportedly is done by EMS in their attempt  to achieve IV axis.  CHEST:  Clear to auscultation bilaterally.  HEART:  Regular rate and rhythm.  S1 and S2.  No murmurs, rubs, or  gallops appreciated.  LUNGS:  Clear to auscultation bilaterally.  ABDOMEN:  Soft, nontender, and nondistended.  Positive bowel sounds.  EXTREMITIES:  No edema.  No tenderness appreciated.  NEUROLOGIC:  Grossly intact.   LABORATORY DATA:  Laboratory studies were all reviewed and were  significant for EKG that revealed lateral segment elevation PTT of 26,  PT of 14.4, INR is 1.1, cardiac enzyme initial set with myoglobin more  than 500.  Troponin is less than 0.05.  CBC with a white count of 9.4,  hemoglobin 15.3, hematocrit 44.2, and platelet count of 203.  CMP was  not done at that point.  Chest x-ray is pending.   ASSESSMENT AND PLAN:  The patient is a 60 year old male who was found  down in ventricular fibrillation and ventricular tachycardia required  multiple shocks, and now with ST segment elevation that his family was  updated as to the situation.  The patient is being taken to the cath lab  at this point.  Will initiate the hypothermia protocol, will sedate the  patient and then change his ET tube from [facility] to regular tube.  Central  line will be placed.  Arterial line will be placed, and the hypothermia  protocol will be initiated for the patient to be admitted to 2900 and  PCCM as the admitting service.      Felipa Evener, MD  Electronically Signed     WJY/MEDQ  D:  08/19/2008  T:  08/20/2008  Job:  [id number]

## 2010-10-18 NOTE — Cardiovascular Report (Signed)
NAME:  Mike Cole, Mike Cole NO.:  [id number]   MEDICAL RECORD NO.:  [id number]          PATIENT TYPE:  INP   LOCATION:  2913                         FACILITY:  [facility]   PHYSICIAN:  Corky Crafts, MDDATE OF BIRTH:  09/23/1950   DATE OF PROCEDURE:  08/19/2008  DATE OF DISCHARGE:                            CARDIAC CATHETERIZATION   PRIMARY PHYSICIAN:  Donia Guiles, MD   PROCEDURES PERFORMED:  Left heart catheterization, left ventriculogram,  coronary angiogram, abdominal aortogram, percutaneous coronary  intervention of the left anterior descending, percutaneous thrombectomy  of the left anterior descending, and intra-aortic balloon pump  placement.   OPERATOR:  Corky Crafts, MD   INDICATIONS:  Cardiac arrest anterior ST elevation MI.   PROCEDURE NARRATIVE:  The patient was brought to the ER after arresting  at home.  He did receive CPR.  He had several episodes of V-tach and on  an EKG that showed sinus rhythm.  He also had anterior ST elevation.  Cardiology was called and he was brought to the Cath Lab.  There was  implied consent because it was an emergency.  His groin was prepped and  draped in the usual sterile fashion.  There was 1% lidocaine infiltrated  to the site.  A 6-French sheath was placed into the right femoral artery  using modified Seldinger technique.  Right coronary artery angiography  was performed using a JR-4.0 catheter.  The catheter was advanced to the  vessel ostium under fluoroscopic guidance.  Digital angiography was  performed in multiple projections using hand injection of contrast.  Left coronary artery angiography was performed using a CLS 3.5 guiding  catheter.  This was a little bit short to perform the intervention.  The  catheter was then switched out for CLS 4.0 catheter and the intervention  was performed.  Please see below for details.  After the intervention, a  pigtail catheter was advanced to the ascending aorta  and across the  aortic valve under fluoroscopic guidance.  Power injection of contrast  was performed in the RAO projection to image the left ventricle.  The  catheter was pulled back under continuous hemodynamic pressure  monitoring.  The catheter was then withdrawn to the abdominal aorta and  a power injection of contrast was performed to image the infrarenal  aorta.  The balloon pump was subsequently inserted under fluoroscopic  guidance.   FINDINGS:  The left main was widely patent.  The right coronary artery  was a large vessel with mild luminal irregularities.  There was a 25%  proximal lesion.  The posterior descending artery was a medium-sized  vessel with mild irregularities.   The left circumflex was a large vessel which had a 25% proximal lesion.  There is an OM1 which originated early from the circumflex.  There was a  25% proximal stenosis.  There was some moderate diffuse disease  throughout the vessel.   The left anterior descending was a large vessel.  It was occluded in the  proximal portion and into the mid vessel after the angioplasty could be  seen that it wrapped around  the apex.  There was a small D1 and a very  small D2 both of which were widely patent.  In the distal vessel, there  is moderate diffuse disease.   The left ventriculogram showed severe hypokinesis of the entire anterior  and apical segment.  The estimated ejection fraction was 20-25%.  The  hypokinesis extended into the distal inferior wall as well.   HEMODYNAMICS:  137/21, LVEDP of 30 mmHg.  Aortic pressure 131/84 with a  mean aortic pressure of 105 mmHg.   The abdominal aortogram showed moderate diffuse atherosclerosis in the  infrarenal segment.  There are single renal arteries bilaterally.  The  left renal artery had a 25% stenosis.  The right renal artery was widely  patent.   PCI NARRATIVE:  A CLS 4.0 guiding catheter was used to engage the left  main.  A Prowater wire was placed  across the LAD.  A Fetch catheter was  used for aspiration and was successful in retrieving some thrombus.  A  2.5 x 12 Apex was then placed across the lesion and inflated to 8  atmospheres for 25 seconds and then again to 8 atmospheres for 20  seconds.  There was a long area of disease.  A 3.0 x 33 Zeta stent was  then deployed across the entire area which did cover the ostium of the  first diagonal at 12 atmospheres for 40 seconds.  The stent was  postdilated with a 3.25 x 20-mm Voyager balloon inflated to 18  atmospheres for 30 seconds and then again more proximally for 18  atmospheres for 30 seconds.  There was TIMI III flow.  The 6-French  sheath was removed and a 7.5 French IABP sheath was placed.  The tip of  the balloon pump was placed above the left mainstem bronchus and he was  placed on one-to-one.  There is no residual stenosis in the LAD at the  end of the procedure.  Of note, the patient was delayed in terms of  getting to the Cath Lab because the first two ECGs showed only  ventricular tachycardia and the ST-segment could not be evaluated.  In  addition, he needs to be intubated and he was placed on a hypothermia  protocol along with getting central line and arterial line placed in the  emergency room.  All of this contributed to keeping the patient in the  emergency room for a longer period of time.   IMPRESSION:  1. Cardiac arrest secondary to occluded left anterior descending.  2. Successful bare-metal stent placed to the left anterior descending,      moderate atherosclerosis in the other vessels but no other      hemodynamically significant lesion.  3. Severe left ventricular dysfunction with an estimated ejection      fraction of 20-25%.  4. No abdominal aortic aneurysm.  5. No significant renal artery stenosis.   RECOMMENDATIONS:  Continue IABP at one-to-one.  He will be admitted to  the CCU.  Integrilin will be continued for 18 hours.  Continue aspirin  and  Plavix for at least 30 days but likely longer.  He will also be on  the hypothermia protocol.  We will continue amiodarone for his  arrhythmia issues and try to initiate beta blocker if his heart rate  will allow.      Corky Crafts, MD  Electronically Signed     JSV/MEDQ  D:  08/19/2008  T:  08/20/2008  Job:  [id number]

## 2010-10-18 NOTE — Consult Note (Signed)
NAME:  Mike Cole, Mike Cole NO.:  [id number]   MEDICAL RECORD NO.:  [id number]           PATIENT TYPE:  INP   LOCATION:                               FACILITY:  [facility]   PHYSICIAN:  Corky Crafts, MDDATE OF BIRTH:  1951/04/14   DATE OF CONSULTATION:  08/19/2008  DATE OF DISCHARGE:  08/28/2008                                 CONSULTATION   DATE OF CONSULTATION:  August 19, 2008   REFERRING PHYSICIAN:  Dr. Felipa Evener   REASON FOR CONSULTATION:  1. Cardiac arrest.  2. Acute anterior ST elevation MI.  3. Ventricular tachycardia/ventricular fibrillation.  4. Respiratory failure.   HISTORY OF PRESENT ILLNESS:  The patient was found unconscious by his  wife and brought immediately to the emergency room via EMS.  He was in  his usual state of health.  The wife just recalls hearing a sound of  someone falling.  The patient did receive CPR from the wife and his son.  He was shocked multiple times and an amiodarone drip was started.  He  was started on the [employer] cooling protocol as well.  The patient is  unable to give any history as he is intubated.  This history is coming  from the medical record.  Past medical history, past surgical history,  allergies, medications, social history, and family history along with  review of systems cannot be obtained from the patient because he is  intubated.   PHYSICAL EXAMINATION:  VITAL SIGNS:  Heart rate is 100, blood pressure  79/48, and oxygen saturation 100% on 100% FiO2  HEAD:  Normocephalic and atraumatic.  Pupils are reactive to light.  NECK:  No JVD.  CARDIOVASCULAR:  Tachycardic, S1 and S2.  LUNGS:  Coarse breath sounds bilaterally.  ABDOMEN:  Soft, nontender, and nondistended.  EXTREMITIES:  No edema.  NEUROLOGIC:  Difficult to assess because he is sedated.   Point-of-care myoglobin is greater than 500.  Point-of-care troponin is  negative.  Other labs are pending.  EKG shows sinus tachycardia with  anterolateral ST segment elevation.   ASSESSMENT/PLAN:  A 60 year old who likely had a ventricular  fibrillation arrest because of an acute anterior myocardial infarction.   PLAN:  He will be taken emergently to the Cardiac Cath Lab.  I would  suspect he has an LAD occlusion and will require revascularization for  this.  He will also likely need intraaortic balloon pump.  Continue with  cooling protocol.  Further plans will be determined based on the outcome  of this catheterization.      Corky Crafts, MD  Electronically Signed     JSV/MEDQ  D:  06/16/2009  T:  06/16/2009  Job:  [id number]

## 2010-10-21 NOTE — Discharge Summary (Signed)
NAME:  Mike Cole, Mike Cole NO.:  [id number]   MEDICAL RECORD NO.:  [id number]          PATIENT TYPE:  INP   LOCATION:  2007                         FACILITY:  [facility]   PHYSICIAN:  Corky Crafts, MDDATE OF BIRTH:  Jan 29, 1951   DATE OF ADMISSION:  08/19/2008  DATE OF DISCHARGE:  08/28/2008                               DISCHARGE SUMMARY   DISCHARGE DIAGNOSES:  1. Ventricular tachycardia arrest, resuscitated.  2. Acute anterior myocardial infarction with bare-metal stent to the      left anterior descending.  3. Cardiogenic shock, resolved.  4. Ischemic cardiomyopathy, ejection fraction of 20-25%.  5. Dyslipidemia.  6. History of transient ischemic attack.   HOSPITAL COURSE:  Mike Cole is a 60 year old male patient who had a  witnessed arrest at home.  CPR was started within 2 minutes.  EMS was  called, and he was found to be in ventricular fibrillation.  He was  given 300 mg of amiodarone in the field.  In the ER, he went back into  ventricular tachycardia and received another 150 mg of amiodarone.  He  was started on the hypothermia, [employer] protocol, and once a followup  EKG was taken, it showed 3-4 mm of ST-segment elevation in the anterior  leads.  He was brought to the Cath Lab emergently.  He was already  sedated and intubated.   He was found to have an occluded proximal LAD lesion.  We placed a bare-  metal stent without difficulty.  We also placed him on Integrilin for  approximately 18 hours.   Later the same day, he had another episode of ventricular tachycardia  that was shocked x1 with resolution to normal sinus rhythm.  He went  into V-tach again and shocked again with restoration of sinus rhythm.  Due to the protocols of the [facility], specific blood pressure had to  be maintained and the patient had to be on pressors for a short period  of time.  His intra-aortic balloon pump was discontinued on August 30, 2008, and the following day,  he was extubated.  For ventilator issues,  the Critical Care Medicine Team was involved and they did provide [facility] care for the patient.   Once the patient's sedation had worn off, he was alert and oriented x3.  There seemed be no neurologic deficits.  There was a question of whether  or not he had pneumonia on chest x-ray.  He did have UTI and these were  treated.   The Electrophysiology Service was consulted because of the patient's  propensity for ventricular tachycardia.  It was decided that he would go  home with a LifeVest and then his EF would be reassessed in 10-12 weeks  and Dr. Graciela Husbands would see him in about 3-4 weeks and would decide during  this time period if his EF was returning to normal as not he would need  a defibrillator.   A 2-D echo during the patient's hospitalization showed the left  ventricle was mildly dilated, EF was about 25% with severe hypokinesis  of the mid distal  anterior septal wall.  There was severe hypokinesis of  the entire periapical wall.  There was also trivial pericardial effusion  posterior to the heart.   We felt the patient was stable enough to go home on August 28, 2008.  He  was definitely improved.   LABORATORY STUDIES:  During this hospitalization include a sodium of  142, potassium 3.5, BUN 14, creatinine 0.97.  Hemoglobin 11.7,  hematocrit 33.7, white count 8.7, platelets 153, magnesium 2.0.  CK 2588  with MB fraction of 365.1, troponin is 74.83.  Last chest x-ray on the  date of discharge showed slightly variation, minimal opacities.  Remainder of the lung bases with probable tiny effusions.   DISCHARGE MEDICATIONS:  1. Enteric-coated aspirin 325 mg a day.  2. Plavix 75 mg a day.  3. Zocor 40 mg a day.  4. Coreg 3.125 mg twice a day.  5. Lisinopril 5 mg a day.  6. K-Dur 40 mEq a day.  7. Lasix 40 mg a day.  8. Levaquin 750 mg daily for 3 more days past discharge.  9. Tessalon Perles 100 mg every q.8 h. p.r.n. cough.    The patient is to remain on a low-sodium, heart-healthy diet, increase  activity slowly.  Activity as per cardiac rehabilitation.  Follow up  with Dr. Eldridge Dace on September 07, 2008, at 12:15 p.m., follow up with Dr.  Graciela Husbands on September 25, 2008, at 8:45 a.m.      Guy Franco, P.A.      Corky Crafts, MD  Electronically Signed    LB/MEDQ  D:  09/09/2008  T:  09/09/2008  Job:  [id number]   cc:   Duke Salvia, MD, [facility]  Donia Guiles, M.D.

## 2010-10-27 ENCOUNTER — Other Ambulatory Visit: Payer: Self-pay | Admitting: *Deleted

## 2010-11-15 IMAGING — CR DG CHEST 2V
2 series · 2 of 2 positions shown · non-contrast
Comparison: 10/30/2008.

CLINICAL DATA: Short of breath.  Pacemaker placement.

CHEST - 2 VIEW

[w chest pa]
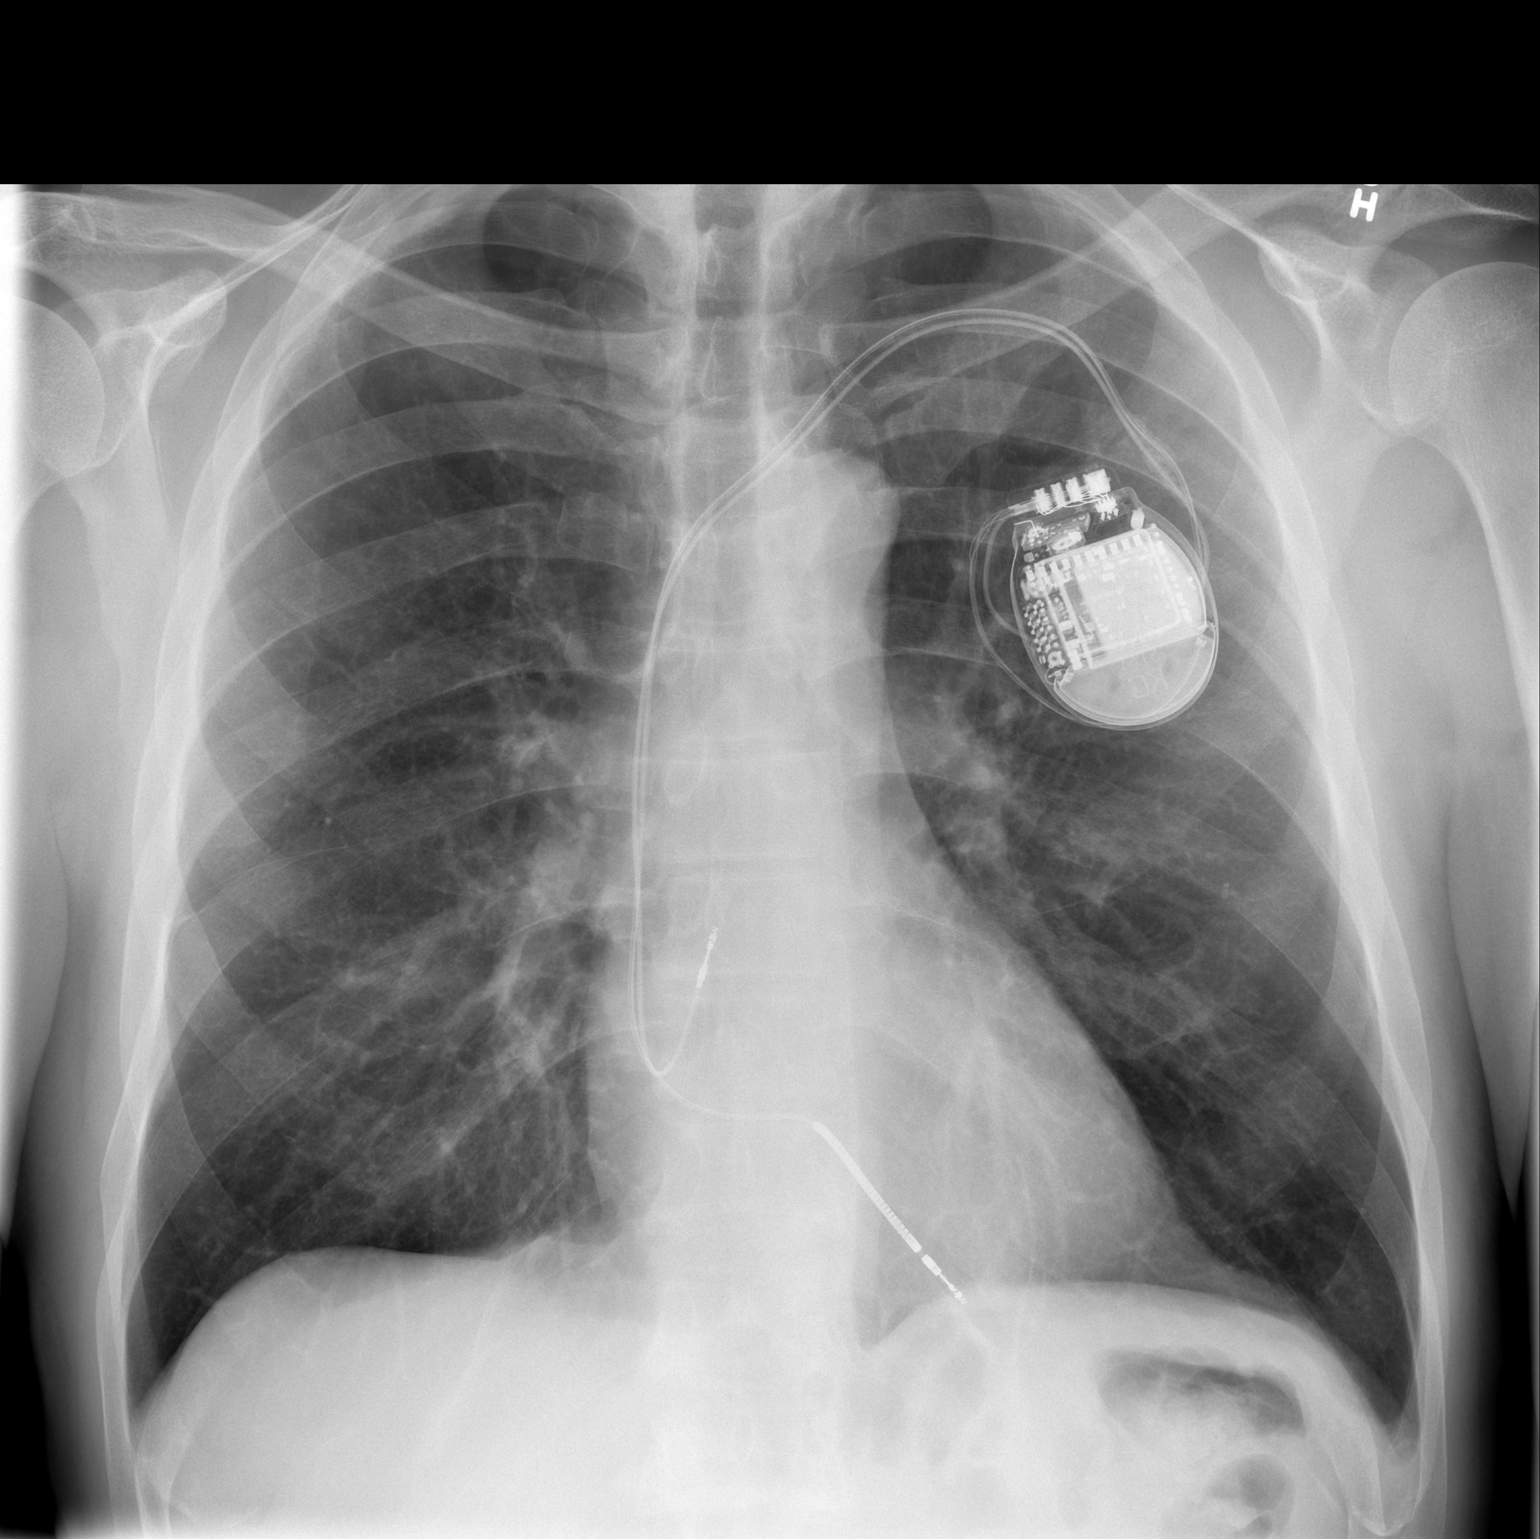

[w chest lat]
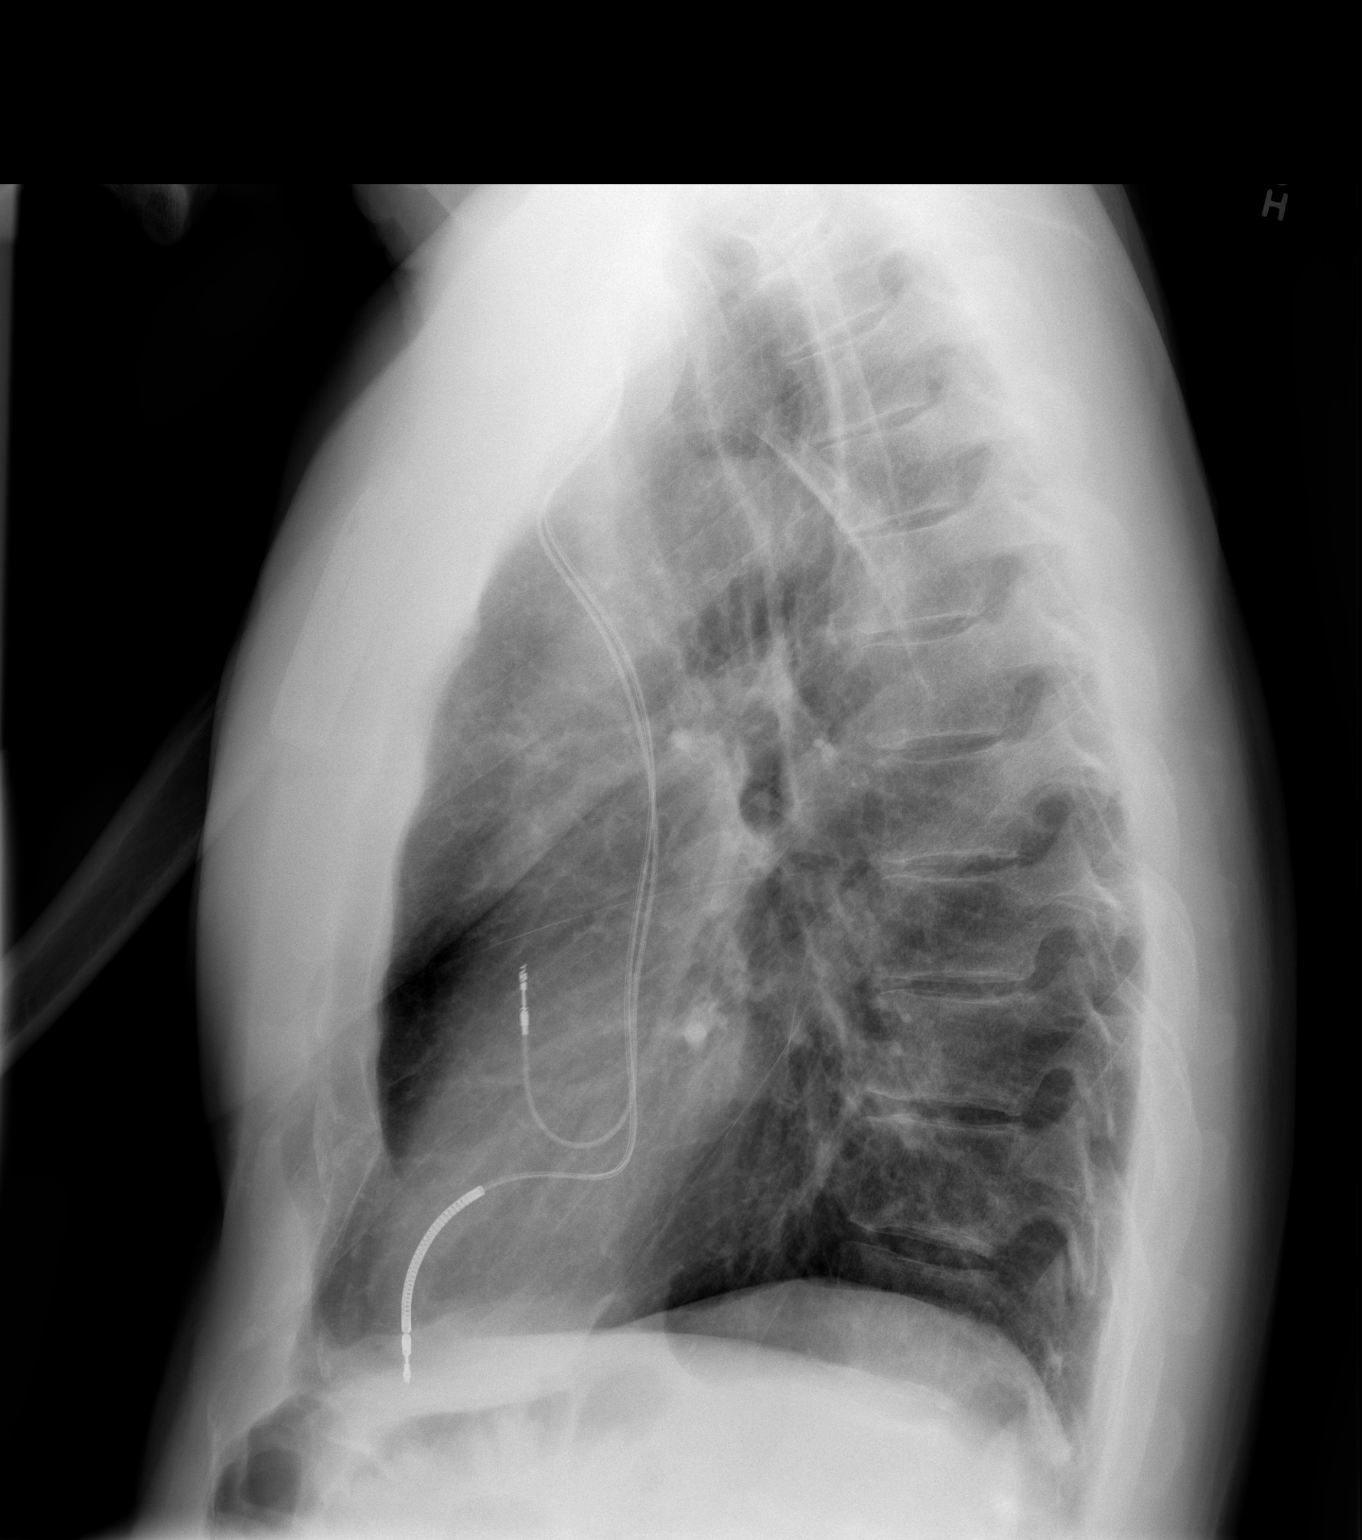

[2 of 2 positions shown; findings below may reference images not displayed]

FINDINGS: Left dual lead pacemaker is unchanged with leads in the
right atrium and right ventricle.  There is no pneumothorax.

The lungs are clear and  there is no infiltrate or edema.  There is
a small left pleural effusion which was not identified previously.
IMPRESSION: Pacemaker placement is unchanged.

Small left pleural effusion has developed.

## 2011-01-19 ENCOUNTER — Encounter: Payer: Self-pay | Admitting: *Deleted

## 2011-01-23 ENCOUNTER — Encounter: Payer: Self-pay | Admitting: *Deleted

## 2011-01-27 ENCOUNTER — Ambulatory Visit (INDEPENDENT_AMBULATORY_CARE_PROVIDER_SITE_OTHER): Payer: Self-pay | Admitting: *Deleted

## 2011-01-27 DIAGNOSIS — I428 Other cardiomyopathies: Secondary | ICD-10-CM

## 2011-02-01 ENCOUNTER — Other Ambulatory Visit: Payer: Self-pay | Admitting: Internal Medicine

## 2011-02-01 LAB — REMOTE ICD DEVICE
BAMS-0001: 180 {beats}/min
BATTERY VOLTAGE: 3.19 V
CHARGE TIME: 10.9 s
DEV-0020ICD: NEGATIVE
HV IMPEDENCE: 74 Ohm
RV LEAD AMPLITUDE: 11.3 mv
RV LEAD IMPEDENCE ICD: 400 Ohm
TZAT-0001SLOWVT: 1
TZAT-0012SLOWVT: 200 ms
TZAT-0019SLOWVT: 7.5 V
TZAT-0020SLOWVT: 1 ms
TZON-0005SLOWVT: 6
TZON-0010SLOWVT: 80 ms
TZST-0001SLOWVT: 4
TZST-0003SLOWVT: 25 J
TZST-0003SLOWVT: 36 J

## 2011-02-14 NOTE — Progress Notes (Signed)
ICD checked by remote. 

## 2011-03-02 ENCOUNTER — Encounter: Payer: Self-pay | Admitting: *Deleted

## 2011-04-17 ENCOUNTER — Ambulatory Visit (INDEPENDENT_AMBULATORY_CARE_PROVIDER_SITE_OTHER): Payer: Self-pay | Admitting: *Deleted

## 2011-04-17 ENCOUNTER — Other Ambulatory Visit: Payer: Self-pay | Admitting: Internal Medicine

## 2011-04-17 ENCOUNTER — Encounter: Payer: Self-pay | Admitting: Internal Medicine

## 2011-04-17 DIAGNOSIS — I4729 Other ventricular tachycardia: Secondary | ICD-10-CM

## 2011-04-17 DIAGNOSIS — I472 Ventricular tachycardia, unspecified: Secondary | ICD-10-CM

## 2011-04-17 DIAGNOSIS — Z95810 Presence of automatic (implantable) cardiac defibrillator: Secondary | ICD-10-CM

## 2011-04-19 LAB — ICD DEVICE OBSERVATION
AL THRESHOLD: 0.5 V
ATRIAL PACING ICD: 31 pct
CHARGE TIME: 11.1 s
DEV-0020ICD: NEGATIVE
DEVICE MODEL ICD: 692026
FVT: 0
HV IMPEDENCE: 80 Ohm
MODE SWITCH EPISODES: 0
PACEART VT: 0
RV LEAD AMPLITUDE: 11.3 mv
RV LEAD THRESHOLD: 0.75 V
TOT-0006: 20100527000000
TOT-0009: 1
TZAT-0004SLOWVT: 8
TZAT-0013SLOWVT: 1
TZAT-0018SLOWVT: NEGATIVE
TZON-0003SLOWVT: 300 ms
TZST-0001SLOWVT: 2
TZST-0001SLOWVT: 3
TZST-0001SLOWVT: 5
TZST-0003SLOWVT: 36 J
VENTRICULAR PACING ICD: 7.9 pct

## 2011-04-19 NOTE — Progress Notes (Signed)
Research patient

## 2011-05-29 ENCOUNTER — Encounter: Payer: Self-pay | Admitting: Internal Medicine

## 2011-07-20 ENCOUNTER — Encounter: Payer: Self-pay | Admitting: Internal Medicine

## 2011-07-20 ENCOUNTER — Ambulatory Visit (INDEPENDENT_AMBULATORY_CARE_PROVIDER_SITE_OTHER): Payer: Self-pay | Admitting: *Deleted

## 2011-07-20 DIAGNOSIS — I2589 Other forms of chronic ischemic heart disease: Secondary | ICD-10-CM

## 2011-07-20 LAB — REMOTE ICD DEVICE
AL IMPEDENCE ICD: 440 Ohm
BAMS-0001: 180 {beats}/min
DEV-0020ICD: NEGATIVE
RV LEAD AMPLITUDE: 11.3 mv
RV LEAD IMPEDENCE ICD: 400 Ohm
TZAT-0001SLOWVT: 1
TZAT-0004SLOWVT: 8
TZAT-0012SLOWVT: 200 ms
TZAT-0013SLOWVT: 1
TZAT-0018SLOWVT: NEGATIVE
TZAT-0019SLOWVT: 7.5 V
TZAT-0020SLOWVT: 1 ms
TZON-0003SLOWVT: 300 ms
TZON-0004SLOWVT: 45
TZON-0005SLOWVT: 6
TZST-0001SLOWVT: 3
TZST-0003SLOWVT: 25 J
TZST-0003SLOWVT: 30 J

## 2011-07-28 NOTE — Progress Notes (Signed)
ICD remote 

## 2011-08-02 ENCOUNTER — Encounter: Payer: Self-pay | Admitting: *Deleted

## 2011-11-06 ENCOUNTER — Ambulatory Visit (INDEPENDENT_AMBULATORY_CARE_PROVIDER_SITE_OTHER): Payer: 59 | Admitting: *Deleted

## 2011-11-06 ENCOUNTER — Encounter: Payer: Self-pay | Admitting: Internal Medicine

## 2011-11-06 DIAGNOSIS — Z95810 Presence of automatic (implantable) cardiac defibrillator: Secondary | ICD-10-CM

## 2011-11-06 DIAGNOSIS — I428 Other cardiomyopathies: Secondary | ICD-10-CM

## 2011-11-06 NOTE — Progress Notes (Signed)
ICD check in clinic- done by research.

## 2011-11-07 LAB — ICD DEVICE OBSERVATION
AL AMPLITUDE: 1 mv
AL IMPEDENCE ICD: 400 Ohm
BAMS-0001: 180 {beats}/min
BATTERY VOLTAGE: 3.02 V
DEVICE MODEL ICD: 692026
HV IMPEDENCE: 73 Ohm
TZAT-0012SLOWVT: 200 ms
TZAT-0013SLOWVT: 1
TZAT-0019SLOWVT: 7.5 V
TZAT-0020SLOWVT: 1 ms
TZON-0004SLOWVT: 45
TZON-0005SLOWVT: 6
TZST-0001SLOWVT: 4
TZST-0003SLOWVT: 30 J
VENTRICULAR PACING ICD: 6.6 pct

## 2012-02-06 ENCOUNTER — Encounter: Payer: 59 | Admitting: Internal Medicine

## 2012-03-20 ENCOUNTER — Ambulatory Visit (INDEPENDENT_AMBULATORY_CARE_PROVIDER_SITE_OTHER): Payer: 59 | Admitting: Internal Medicine

## 2012-03-20 ENCOUNTER — Encounter: Payer: Self-pay | Admitting: Internal Medicine

## 2012-03-20 VITALS — BP 121/77 | HR 58 | Ht 74.0 in | Wt 215.4 lb

## 2012-03-20 DIAGNOSIS — I472 Ventricular tachycardia, unspecified: Secondary | ICD-10-CM

## 2012-03-20 DIAGNOSIS — I4729 Other ventricular tachycardia: Secondary | ICD-10-CM

## 2012-03-20 DIAGNOSIS — I2589 Other forms of chronic ischemic heart disease: Secondary | ICD-10-CM

## 2012-03-20 DIAGNOSIS — Z95810 Presence of automatic (implantable) cardiac defibrillator: Secondary | ICD-10-CM

## 2012-03-20 DIAGNOSIS — R0602 Shortness of breath: Secondary | ICD-10-CM

## 2012-03-20 NOTE — Assessment & Plan Note (Signed)
The patient's device was interrogated.  The information was reviewed. No changes were made in the programming.    

## 2012-03-20 NOTE — Assessment & Plan Note (Signed)
Stable on current meds 

## 2012-03-20 NOTE — Assessment & Plan Note (Signed)
This continues to be an intermittent problem. I suggested that he consider how much of her problem is a consider referral to pulmonary

## 2012-03-20 NOTE — Progress Notes (Signed)
Patient Care Team: Burns Spain, MD as PCP - General (Internal Medicine)   HPI  Mike Cole is a 61 y.o. male seen in followup for ventricular tachycardia/fibrillation presented as an out of hospital cardiac arrest. He simply underwent intervention of an occluded LAD but had persistent left ventricular dysfunction prompting ICD implantation.  Post procedural dyspnea was relieved by reprogramming to DDI mode  thiis is quite abrupt and episodic  unassoc with chest pain   he l lost his job and is now [employer]  Past Medical History  Diagnosis Date  . Ventricular tachycardia     arrest, resuscitated  . Acute anterior myocardial infarction     BMS to the LAD  . Cardiogenic shock     resolved  . Ischemic cardiomyopathy     EF of 20-25%  . Dyslipidemia   . History of transient ischemic attack     Past Surgical History  Procedure Date  . [facility] jude current + dr 10/30/2008    Current Outpatient Prescriptions  Medication Sig Dispense Refill  . aspirin 325 MG tablet Take 325 mg by mouth daily.        . benazepril (LOTENSIN) 40 MG tablet Take 40 mg by mouth daily.        . carvedilol (COREG) 12.5 MG tablet Take 12.5 mg by mouth 2 (two) times daily with a meal.        . furosemide (LASIX) 20 MG tablet Take 20 mg by mouth daily.        . potassium chloride SA (K-DUR,KLOR-CON) 20 MEQ tablet Take 20 mEq by mouth daily.        . simvastatin (ZOCOR) 80 MG tablet Take 80 mg by mouth daily.          No Known Allergies  Review of Systems negative except from HPI and PMH  Physical Exam BP 121/77  Pulse 58  Ht 6\' 2"  (1.88 m)  Wt 215 lb 6.4 oz (97.705 kg)  BMI 27.66 kg/m2 Well developed and well nourished in no acute distress HENT normal E scleral and icterus clear Neck Supple JVP flat; carotids brisk and full Clear to ausculation  Regular rate and rhythm, no murmurs gallops or rub Soft with active bowel sounds No clubbing cyanosis none Edema Alert and oriented, grossly  normal motor and sensory function Skin Warm and Dry    Assessment and  Plan

## 2012-03-20 NOTE — Patient Instructions (Addendum)
Remote monitoring is used to monitor your Pacemaker of ICD from home. This monitoring reduces the number of office visits required to check your device to one time per year. It allows [place] to keep an eye on the functioning of your device to ensure it is working properly. You are scheduled for a device check from home on June 24, 2012. You may send your transmission at any time that day. If you have a wireless device, the transmission will be sent automatically. After your physician reviews your transmission, you will receive a postcard with your next transmission date.  Your physician wants you to follow-up in: 1 year with Dr Graciela Husbands.  You will receive a reminder letter in the mail two months in advance. If you don't receive a letter, please call our office to schedule the follow-up appointment.

## 2012-03-20 NOTE — Assessment & Plan Note (Signed)
No intercurrent Ventricular tachycardia vt

## 2012-03-21 LAB — ICD DEVICE OBSERVATION
AL IMPEDENCE ICD: 440 Ohm
BATTERY VOLTAGE: 2.99 V
DEVICE MODEL ICD: 692026
RV LEAD AMPLITUDE: 11.3 mv
TZAT-0013SLOWVT: 1
TZAT-0018SLOWVT: NEGATIVE
TZAT-0020SLOWVT: 1 ms
TZON-0005SLOWVT: 6
TZST-0001SLOWVT: 2
TZST-0001SLOWVT: 4
TZST-0003SLOWVT: 30 J
VENTRICULAR PACING ICD: 33 pct

## 2012-06-24 ENCOUNTER — Encounter: Payer: Medicare Other | Admitting: *Deleted

## 2012-06-25 ENCOUNTER — Encounter: Payer: Self-pay | Admitting: *Deleted

## 2012-07-10 ENCOUNTER — Encounter: Payer: Self-pay | Admitting: Internal Medicine

## 2012-07-10 ENCOUNTER — Other Ambulatory Visit: Payer: Self-pay

## 2012-07-10 ENCOUNTER — Ambulatory Visit (INDEPENDENT_AMBULATORY_CARE_PROVIDER_SITE_OTHER): Payer: Medicare Other | Admitting: *Deleted

## 2012-07-10 DIAGNOSIS — Z95810 Presence of automatic (implantable) cardiac defibrillator: Secondary | ICD-10-CM

## 2012-07-10 DIAGNOSIS — I472 Ventricular tachycardia, unspecified: Secondary | ICD-10-CM

## 2012-07-10 DIAGNOSIS — I4729 Other ventricular tachycardia: Secondary | ICD-10-CM

## 2012-07-19 LAB — REMOTE ICD DEVICE
AL AMPLITUDE: 1.4 mv
BATTERY VOLTAGE: 2.93 V
DEV-0020ICD: NEGATIVE
DEVICE MODEL ICD: 692026
RV LEAD AMPLITUDE: 11.3 mv
TZAT-0001SLOWVT: 1
TZAT-0004SLOWVT: 8
TZAT-0019SLOWVT: 7.5 V
TZON-0004SLOWVT: 45
TZON-0010SLOWVT: 80 ms
TZST-0001SLOWVT: 2
TZST-0001SLOWVT: 3
TZST-0003SLOWVT: 25 J
TZST-0003SLOWVT: 36 J
VENTRICULAR PACING ICD: 5.6 pct

## 2012-07-30 ENCOUNTER — Encounter: Payer: Self-pay | Admitting: *Deleted

## 2012-10-21 ENCOUNTER — Ambulatory Visit (INDEPENDENT_AMBULATORY_CARE_PROVIDER_SITE_OTHER): Payer: Medicare Other | Admitting: *Deleted

## 2012-10-21 DIAGNOSIS — I472 Ventricular tachycardia, unspecified: Secondary | ICD-10-CM

## 2012-10-21 DIAGNOSIS — I4729 Other ventricular tachycardia: Secondary | ICD-10-CM

## 2012-10-21 LAB — ICD DEVICE OBSERVATION
AL AMPLITUDE: 1.5 mv
AL THRESHOLD: 0.5 V
ATRIAL PACING ICD: 25 pct
BAMS-0001: 180 {beats}/min
DEV-0020ICD: NEGATIVE
DEVICE MODEL ICD: 692026
FVT: 0
MODE SWITCH EPISODES: 0
PACEART VT: 0
RV LEAD AMPLITUDE: 11.3 mv
RV LEAD IMPEDENCE ICD: 362.5 Ohm
RV LEAD THRESHOLD: 1 V
TZAT-0001SLOWVT: 1
TZAT-0012SLOWVT: 200 ms
TZAT-0013SLOWVT: 1
TZAT-0020SLOWVT: 1 ms
TZON-0005SLOWVT: 6
TZST-0001SLOWVT: 4
TZST-0001SLOWVT: 5
TZST-0003SLOWVT: 25 J
TZST-0003SLOWVT: 30 J
VENTRICULAR PACING ICD: 6.2 pct
VF: 0

## 2012-10-21 NOTE — Progress Notes (Signed)
icd check in clinic. Normal device function. ROV in 6 mths w/SK. Merlin 01-27-13.

## 2012-11-19 ENCOUNTER — Encounter: Payer: Self-pay | Admitting: Internal Medicine

## 2013-01-27 ENCOUNTER — Encounter: Payer: Self-pay | Admitting: Internal Medicine

## 2013-01-27 ENCOUNTER — Ambulatory Visit (INDEPENDENT_AMBULATORY_CARE_PROVIDER_SITE_OTHER): Payer: Medicare Other | Admitting: *Deleted

## 2013-01-27 DIAGNOSIS — I4729 Other ventricular tachycardia: Secondary | ICD-10-CM

## 2013-01-27 DIAGNOSIS — Z95810 Presence of automatic (implantable) cardiac defibrillator: Secondary | ICD-10-CM

## 2013-01-27 DIAGNOSIS — I472 Ventricular tachycardia, unspecified: Secondary | ICD-10-CM

## 2013-01-28 LAB — REMOTE ICD DEVICE
AL AMPLITUDE: 1.7 mv
ATRIAL PACING ICD: 23 pct
BATTERY VOLTAGE: 2.68 V
DEVICE MODEL ICD: 692026
HV IMPEDENCE: 73 Ohm
TZON-0010SLOWVT: 80 ms
TZST-0001SLOWVT: 2
TZST-0001SLOWVT: 5
TZST-0003SLOWVT: 36 J
VENTRICULAR PACING ICD: 6.5 pct

## 2013-02-04 ENCOUNTER — Encounter: Payer: Self-pay | Admitting: *Deleted

## 2013-03-07 ENCOUNTER — Encounter: Payer: Self-pay | Admitting: Internal Medicine

## 2013-03-07 ENCOUNTER — Ambulatory Visit (INDEPENDENT_AMBULATORY_CARE_PROVIDER_SITE_OTHER): Payer: Medicare Other | Admitting: Internal Medicine

## 2013-03-07 VITALS — BP 122/81 | HR 56 | Ht 74.0 in | Wt 215.0 lb

## 2013-03-07 DIAGNOSIS — R001 Bradycardia, unspecified: Secondary | ICD-10-CM

## 2013-03-07 DIAGNOSIS — I2589 Other forms of chronic ischemic heart disease: Secondary | ICD-10-CM

## 2013-03-07 DIAGNOSIS — I498 Other specified cardiac arrhythmias: Secondary | ICD-10-CM

## 2013-03-07 DIAGNOSIS — I4729 Other ventricular tachycardia: Secondary | ICD-10-CM

## 2013-03-07 DIAGNOSIS — I472 Ventricular tachycardia, unspecified: Secondary | ICD-10-CM

## 2013-03-07 DIAGNOSIS — Z95810 Presence of automatic (implantable) cardiac defibrillator: Secondary | ICD-10-CM

## 2013-03-07 NOTE — Assessment & Plan Note (Signed)
No intercurrent Ventricular tachycardia  

## 2013-03-07 NOTE — Assessment & Plan Note (Signed)
The patient's device was interrogated.  The information was reviewed. No changes were made in the programming.    

## 2013-03-07 NOTE — Assessment & Plan Note (Signed)
Stable with appropriate chronotropic response and no symptoms

## 2013-03-07 NOTE — Progress Notes (Signed)
Patient has no care team.   HPI  Mike Cole is a 62 y.o. male seen in followup for ventricular tachycardia/fibrillation presented as an out of hospital cardiac arrest. He simply underwent intervention of an occluded LAD but had persistent left ventricular dysfunction prompting ICD implantation.   The patient denies SOB, chest pain edema or palpitations.  There has been no syncope or presyncope.   Past Medical History  Diagnosis Date  . Ventricular tachycardia     arrest, resuscitated  . Acute anterior myocardial infarction     BMS to the LAD  . Cardiogenic shock     resolved  . Ischemic cardiomyopathy     EF of 20-25%  . Dyslipidemia   . History of transient ischemic attack     Past Surgical History  Procedure Laterality Date  . St jude current + dr  10/30/2008    Current Outpatient Prescriptions  Medication Sig Dispense Refill  . aspirin 81 MG tablet Take 81 mg by mouth daily.      . benazepril (LOTENSIN) 40 MG tablet Take 40 mg by mouth daily.        . carvedilol (COREG) 12.5 MG tablet Take 12.5 mg by mouth 2 (two) times daily with a meal.        . furosemide (LASIX) 20 MG tablet Take 20 mg by mouth daily.        . potassium chloride SA (K-DUR,KLOR-CON) 20 MEQ tablet Take 20 mEq by mouth daily.        . simvastatin (ZOCOR) 80 MG tablet Take 80 mg by mouth daily.         No current facility-administered medications for this visit.    No Known Allergies  Review of Systems negative except from HPI and PMH  Physical Exam BP 122/81  Pulse 56  Ht 6\' 2"  (1.88 m)  Wt 215 lb (97.523 kg)  BMI 27.59 kg/m2 Well developed and nourished in no acute distress HENT normal Neck supple with JVP-flat Clear Regular rate and rhythm, 2/6 systolic murmur status post  Abd-soft with active BS No Clubbing cyanosis edema Skin-warm and dry A & Oriented  Grossly normal sensory and motor function   ECG demonstrates atrial pacing at 50 Intervals 20/10/43 Try her current wall  MI  Assessment and  Plan

## 2013-03-07 NOTE — Assessment & Plan Note (Signed)
Continue current medications. 

## 2013-03-07 NOTE — Patient Instructions (Signed)
Your physician wants you to follow-up in: one year with Dr. Graciela Husbands.  You will receive a reminder letter in the mail two months in advance. If you don't receive a letter, please call our office to schedule the follow-up appointment.

## 2013-03-10 LAB — ICD DEVICE OBSERVATION
AL THRESHOLD: 0.5 V
ATRIAL PACING ICD: 26 pct
BAMS-0001: 180 {beats}/min
DEV-0020ICD: NEGATIVE
FVT: 0
MODE SWITCH EPISODES: 0
PACEART VT: 0
RV LEAD AMPLITUDE: 11.3 mv
RV LEAD IMPEDENCE ICD: 512.5 Ohm
RV LEAD THRESHOLD: 0.75 V
TOT-0008: 0
TZAT-0001SLOWVT: 1
TZAT-0004SLOWVT: 8
TZAT-0012SLOWVT: 200 ms
TZAT-0013SLOWVT: 1
TZST-0001SLOWVT: 5
TZST-0003SLOWVT: 25 J
TZST-0003SLOWVT: 30 J
VENTRICULAR PACING ICD: 7.6 pct

## 2013-06-09 ENCOUNTER — Ambulatory Visit (INDEPENDENT_AMBULATORY_CARE_PROVIDER_SITE_OTHER): Payer: Medicare Other | Admitting: *Deleted

## 2013-06-09 DIAGNOSIS — I4729 Other ventricular tachycardia: Secondary | ICD-10-CM

## 2013-06-09 DIAGNOSIS — I472 Ventricular tachycardia, unspecified: Secondary | ICD-10-CM

## 2013-06-09 DIAGNOSIS — I2589 Other forms of chronic ischemic heart disease: Secondary | ICD-10-CM

## 2013-06-09 DIAGNOSIS — R001 Bradycardia, unspecified: Secondary | ICD-10-CM

## 2013-06-09 DIAGNOSIS — I498 Other specified cardiac arrhythmias: Secondary | ICD-10-CM

## 2013-06-15 LAB — MDC_IDC_ENUM_SESS_TYPE_REMOTE
Battery Remaining Longevity: 42 mo
Battery Voltage: 2.65 V
Brady Statistic RA Percent Paced: 23 %
Brady Statistic RV Percent Paced: 5.6 %
HIGH POWER IMPEDANCE MEASURED VALUE: 83 Ohm
HighPow Impedance: 83 Ohm
Lead Channel Impedance Value: 450 Ohm
Lead Channel Sensing Intrinsic Amplitude: 1.4 mV
Lead Channel Setting Pacing Amplitude: 2 V
Lead Channel Setting Pacing Amplitude: 2.5 V
Lead Channel Setting Sensing Sensitivity: 0.3 mV
MDC IDC MSMT LEADCHNL RV IMPEDANCE VALUE: 430 Ohm
MDC IDC MSMT LEADCHNL RV SENSING INTR AMPL: 11.3 mV
MDC IDC PG SERIAL: 692026
MDC IDC SESS DTM: 20150105150235
MDC IDC SET LEADCHNL RV PACING PULSEWIDTH: 0.5 ms
MDC IDC SET ZONE DETECTION INTERVAL: 250 ms
MDC IDC STAT BRADY AP VP PERCENT: 5.6 %
MDC IDC STAT BRADY AP VS PERCENT: 17 %
MDC IDC STAT BRADY AS VP PERCENT: 1 %
MDC IDC STAT BRADY AS VS PERCENT: 77 %
Zone Setting Detection Interval: 300 ms

## 2013-07-09 ENCOUNTER — Encounter: Payer: Self-pay | Admitting: *Deleted

## 2013-07-17 ENCOUNTER — Encounter: Payer: Self-pay | Admitting: Internal Medicine

## 2013-08-04 ENCOUNTER — Ambulatory Visit: Payer: Medicare Other | Admitting: Interventional Cardiology

## 2013-08-16 ENCOUNTER — Encounter (HOSPITAL_COMMUNITY): Payer: Self-pay | Admitting: Emergency Medicine

## 2013-08-16 ENCOUNTER — Emergency Department (HOSPITAL_COMMUNITY)
Admission: EM | Admit: 2013-08-16 | Discharge: 2013-08-16 | Disposition: A | Discharge status: Home / Self Care | Payer: Medicare Other | Source: Home / Self Care | Attending: Emergency Medicine | Admitting: Emergency Medicine

## 2013-08-16 DIAGNOSIS — L739 Follicular disorder, unspecified: Secondary | ICD-10-CM

## 2013-08-16 DIAGNOSIS — L678 Other hair color and hair shaft abnormalities: Secondary | ICD-10-CM

## 2013-08-16 DIAGNOSIS — L738 Other specified follicular disorders: Secondary | ICD-10-CM

## 2013-08-16 MED ORDER — CHLORHEXIDINE GLUCONATE 4 % EX LIQD: Freq: Every day | CUTANEOUS | Status: DC | PRN

## 2013-08-16 MED ORDER — DOXYCYCLINE HYCLATE 100 MG PO CAPS: 100.0000 mg | ORAL_CAPSULE | Freq: Two times a day (BID) | ORAL | Status: DC

## 2013-08-16 NOTE — ED Provider Notes (Signed)
  Chief Complaint    Chief Complaint  Patient presents with  . Rash    History of Present Illness      Mike Cole is a 63 year old male who has had an 11 day history of a rash that began on his torso. At onset he went to his family physician in [place]. They did not know what was and gave him a prednisone Dosepak. He's finishing this up today and has not helped at all. The rash seems to be spreading to his face, and down his arms and legs. He denies any itching. The lesions are somewhat sore briefly. He's felt mildly chilled but no fever. He's also had a dry cough. The rash came on about the same time he started krill oil. He has subsequently stopped this. There no new foods or medications. He's also working on his car and been exposed to solvents. He has no exposure to hot tubs or to luffa sponges.  Review of Systems   Other than as noted above, the patient denies any of the following symptoms: Systemic:  No fever, chills, or myalgias. ENT:  No nasal congestion, rhinorrhea, sore throat, swelling of lips, tongue or throat. Resp:  No cough, wheezing, or shortness of breath.  [place]    Past medical history, family history, social history, meds, and allergies were reviewed.   Physical Exam     Vital signs:  BP 136/81  Pulse 58  Temp(Src) 98.6 F (37 C) (Oral)  Resp 16  SpO2 100% Gen:  Alert, oriented, in no distress. ENT:  Pharynx clear, no intraoral lesions, moist mucous membranes. Lungs:  Clear to auscultation. Skin:  He has multiple erythematous bumps on his trunk, some on his face, and some on his legs. Some of these appear pustular.  Course in [facility] [place]     One of the pustules on his back was ruptured with a #11 blade and cultured.  Assessment    The encounter diagnosis was Folliculitis.  Plan     1.  Meds:  The following meds were prescribed:   Discharge Medication List as of 08/16/2013  5:36 PM    START taking these medications   Details   chlorhexidine (HIBICLENS) 4 % external liquid Apply topically daily as needed., Starting 08/16/2013, Until Discontinued, Normal    doxycycline (VIBRAMYCIN) 100 MG capsule Take 1 capsule (100 mg total) by mouth 2 (two) times daily., Starting 08/16/2013, Until Discontinued, Normal        2.  Patient Education/Counseling:  The patient was given appropriate handouts, self care instructions, and instructed in symptomatic relief.    3.  Follow up:  The patient was told to follow up here if no better in 3 to 4 days, or sooner if becoming worse in any way, and given some red flag symptoms such as worsening rash, fever, or difficulty breathing which would prompt immediate return.  Follow up here if necessary.      Harden Mo, MD 08/16/13 [phone]

## 2013-08-16 NOTE — Discharge Instructions (Signed)
Folliculitis  Folliculitis is redness, soreness, and swelling (inflammation) of the hair follicles. This condition can occur anywhere on the body. People with weakened immune systems, diabetes, or obesity have a greater risk of getting folliculitis. CAUSES  Bacterial infection. This is the most common cause.  Fungal infection.  Viral infection.  Contact with certain chemicals, especially oils and tars. Long-term folliculitis can result from bacteria that live in the nostrils. The bacteria may trigger multiple outbreaks of folliculitis over time. SYMPTOMS Folliculitis most commonly occurs on the scalp, thighs, legs, back, buttocks, and areas where hair is shaved frequently. An early sign of folliculitis is a small, white or yellow, pus-filled, itchy lesion (pustule). These lesions appear on a red, inflamed follicle. They are usually less than 0.2 inches (5 mm) wide. When there is an infection of the follicle that goes deeper, it becomes a boil or furuncle. A group of closely packed boils creates a larger lesion (carbuncle). Carbuncles tend to occur in hairy, sweaty areas of the body. DIAGNOSIS  Your caregiver can usually tell what is wrong by doing a physical exam. A sample may be taken from one of the lesions and tested in a lab. This can help determine what is causing your folliculitis. TREATMENT  Treatment may include:  Applying warm compresses to the affected areas.  Taking antibiotic medicines orally or applying them to the skin.  Draining the lesions if they contain a large amount of pus or fluid.  Laser hair removal for cases of long-lasting folliculitis. This helps to prevent regrowth of the hair. HOME CARE INSTRUCTIONS  Apply warm compresses to the affected areas as directed by your caregiver.  If antibiotics are prescribed, take them as directed. Finish them even if you start to feel better.  You may take over-the-counter medicines to relieve itching.  Do not shave  irritated skin.  Follow up with your caregiver as directed. SEEK IMMEDIATE MEDICAL CARE IF:   You have increasing redness, swelling, or pain in the affected area.  You have a fever. MAKE SURE YOU:  Understand these instructions.  Will watch your condition.  Will get help right away if you are not doing well or get worse. Document Released: 07/31/2001 Document Revised: 11/21/2011 Document Reviewed: 08/22/2011 ExitCare Patient Information 2014 [employer].  

## 2013-08-16 NOTE — ED Notes (Signed)
Patient complains of rash that started on abdomen and was treated with prednisone at [facility]; but after finishing Rx it has gotten progressively worse and has spread to arms and legs.

## 2013-08-20 LAB — CULTURE, ROUTINE-ABSCESS
CULTURE: NO GROWTH
Gram Stain: NONE SEEN
SPECIAL REQUESTS: NORMAL

## 2013-08-20 NOTE — Progress Notes (Signed)
Quick Note:  Test result was normal. No further action is needed at this time. ______ 

## 2013-08-29 ENCOUNTER — Other Ambulatory Visit: Payer: Self-pay | Admitting: Cardiology

## 2013-08-29 MED ORDER — ATORVASTATIN CALCIUM 40 MG PO TABS: 40.0000 mg | ORAL_TABLET | Freq: Every day | ORAL | Status: DC

## 2013-09-03 ENCOUNTER — Ambulatory Visit (INDEPENDENT_AMBULATORY_CARE_PROVIDER_SITE_OTHER): Payer: Medicare Other | Admitting: *Deleted

## 2013-09-03 DIAGNOSIS — R001 Bradycardia, unspecified: Secondary | ICD-10-CM

## 2013-09-03 DIAGNOSIS — I2589 Other forms of chronic ischemic heart disease: Secondary | ICD-10-CM

## 2013-09-03 DIAGNOSIS — I498 Other specified cardiac arrhythmias: Secondary | ICD-10-CM

## 2013-09-03 LAB — MDC_IDC_ENUM_SESS_TYPE_INCLINIC
Battery Remaining Longevity: 42 mo
Brady Statistic RV Percent Paced: 4.9 %
Date Time Interrogation Session: 20150401112328
HIGH POWER IMPEDANCE MEASURED VALUE: 84 Ohm
HighPow Impedance: 84.375
Implantable Pulse Generator Serial Number: 692026
Lead Channel Impedance Value: 425 Ohm
Lead Channel Pacing Threshold Amplitude: 0.5 V
Lead Channel Pacing Threshold Amplitude: 0.5 V
Lead Channel Pacing Threshold Amplitude: 0.75 V
Lead Channel Pacing Threshold Amplitude: 0.75 V
Lead Channel Pacing Threshold Pulse Width: 0.5 ms
Lead Channel Pacing Threshold Pulse Width: 0.5 ms
Lead Channel Pacing Threshold Pulse Width: 0.5 ms
Lead Channel Sensing Intrinsic Amplitude: 1.5 mV
Lead Channel Sensing Intrinsic Amplitude: 11.3 mV
Lead Channel Setting Pacing Amplitude: 2 V
Lead Channel Setting Pacing Amplitude: 2.5 V
Lead Channel Setting Sensing Sensitivity: 0.3 mV
MDC IDC MSMT BATTERY VOLTAGE: 2.6 V
MDC IDC MSMT LEADCHNL RA PACING THRESHOLD PULSEWIDTH: 0.5 ms
MDC IDC MSMT LEADCHNL RV IMPEDANCE VALUE: 425 Ohm
MDC IDC SET LEADCHNL RV PACING PULSEWIDTH: 0.5 ms
MDC IDC STAT BRADY RA PERCENT PACED: 22 %
Zone Setting Detection Interval: 250 ms
Zone Setting Detection Interval: 300 ms

## 2013-09-03 NOTE — Progress Notes (Signed)
ICD check in clinic. Normal device function. Thresholds and sensing consistent with previous device measurements. Impedance trends stable over time. No evidence of any ventricular arrhythmias. 1 mode switches. Histogram distribution appropriate for patient and level of activity. No changes made this session. Device programmed at appropriate safety margins. Device programmed to optimize intrinsic conduction. Estimated longevity 3.1 years. Pt enrolled in remote follow-up. Plan to check device every 3 months remotely and in office annually. Patient education completed including shock plan. Alert tones/vibration demonstrated for patient.  [place] 12/08/13.  Checked by Armed forces training and education officer for research.

## 2013-09-18 ENCOUNTER — Other Ambulatory Visit: Payer: Self-pay

## 2013-09-18 MED ORDER — POTASSIUM CHLORIDE CRYS ER 20 MEQ PO TBCR: 20.0000 meq | EXTENDED_RELEASE_TABLET | Freq: Every day | ORAL | Status: DC

## 2013-09-18 MED ORDER — FUROSEMIDE 20 MG PO TABS: 20.0000 mg | ORAL_TABLET | Freq: Every day | ORAL | Status: DC

## 2013-09-18 MED ORDER — CARVEDILOL 12.5 MG PO TABS: 12.5000 mg | ORAL_TABLET | Freq: Two times a day (BID) | ORAL | Status: DC

## 2013-09-19 ENCOUNTER — Encounter: Payer: Self-pay | Admitting: Interventional Cardiology

## 2013-10-08 ENCOUNTER — Ambulatory Visit (INDEPENDENT_AMBULATORY_CARE_PROVIDER_SITE_OTHER): Payer: Medicare Other | Admitting: Interventional Cardiology

## 2013-10-08 ENCOUNTER — Ambulatory Visit: Payer: Medicare Other | Admitting: Interventional Cardiology

## 2013-10-08 ENCOUNTER — Encounter: Payer: Self-pay | Admitting: Interventional Cardiology

## 2013-10-08 VITALS — BP 122/73 | HR 60 | Ht 74.0 in | Wt 217.1 lb

## 2013-10-08 DIAGNOSIS — I2510 Atherosclerotic heart disease of native coronary artery without angina pectoris: Secondary | ICD-10-CM

## 2013-10-08 DIAGNOSIS — I2589 Other forms of chronic ischemic heart disease: Secondary | ICD-10-CM

## 2013-10-08 DIAGNOSIS — E785 Hyperlipidemia, unspecified: Secondary | ICD-10-CM

## 2013-10-08 NOTE — Patient Instructions (Addendum)
Your physician recommends that you continue on your current medications as directed. Please refer to the Current Medication list given to you today.  Your physician recommends that you return for a FASTING lipid profile and cmet   Your physician wants you to follow-up in: 1 year with Dr. Irish Lack. You will receive a reminder letter in the mail two months in advance. If you don't receive a letter, please call our office to schedule the follow-up appointment.

## 2013-10-08 NOTE — Progress Notes (Signed)
Patient ID: Mike Cole, male   DOB: 1950/11/12, 64 y.o.   MRN: [id number]    [place], [place], [place]  [address] Phone: [phone] Fax:  [phone]  Date:  10/08/2013   ID:  Mike Cole, Mike Cole 20-Aug-1950, MRN [id number]  PCP:  Default, Provider, MD      History of Present Illness: Mike Cole is a 63 y.o. male who had an anterior MI and ischemic cardiomyopathy. He had been walking up to 6 miles a day last summer. He stopped doing this recently. He will get back to this when the weather is better. He has not had any chest pain. He has been more SHOB recently. CAD/ASCVD:  Denies : Chest pain.  Dyspnea on exertion.  Leg edema.  Nitroglycerin.  Orthopnea.  Palpitations.  Paroxysmal nocturnal dyspnea.  Syncope.     Wt Readings from Last 3 Encounters:  10/08/13 217 lb 1.9 oz (98.485 kg)  03/07/13 215 lb (97.523 kg)  03/20/12 215 lb 6.4 oz (97.705 kg)     Past Medical History  Diagnosis Date  . Ventricular tachycardia     arrest, resuscitated  . Acute anterior myocardial infarction     BMS to the LAD  . Cardiogenic shock     resolved  . Ischemic cardiomyopathy     EF of 20-25%  . Dyslipidemia   . History of transient ischemic attack     Current Outpatient Prescriptions  Medication Sig Dispense Refill  . aspirin 81 MG tablet Take 81 mg by mouth daily.      [name] atorvastatin (LIPITOR) 40 MG tablet Take 1 tablet (40 mg total) by mouth daily at 6 PM.  30 tablet  2  . benazepril (LOTENSIN) 40 MG tablet Take 40 mg by mouth daily.        . carvedilol (COREG) 12.5 MG tablet Take 1 tablet (12.5 mg total) by mouth 2 (two) times daily with a meal.  60 tablet  3  . furosemide (LASIX) 20 MG tablet Take 1 tablet (20 mg total) by mouth daily.  30 tablet  3  . potassium chloride SA (K-DUR,KLOR-CON) 20 MEQ tablet Take 1 tablet (20 mEq total) by mouth daily.  30 tablet  2   No current facility-administered medications for this visit.    Allergies:   No  Known Allergies  Social History:  The patient  reports that he quit smoking about 5 years ago. His smoking use included Cigarettes. He smoked 0.00 packs per day. He does not have any smokeless tobacco history on file. He reports that he drinks alcohol. He reports that he does not use illicit drugs.   Family History:  The patient's family history is not on file.   ROS:  Please see the history of present illness.  No nausea, vomiting.  No fevers, chills.  No focal weakness.  No dysuria.   All other systems reviewed and negative.   PHYSICAL EXAM: VS:  BP 122/73  Pulse 60  Ht 6\' 2"  (1.88 m)  Wt 217 lb 1.9 oz (98.485 kg)  BMI 27.86 kg/m2 Well nourished, well developed, in no acute distress HEENT: normal Neck: no JVD, no carotid bruits Cardiac:  normal S1, S2; RRR;  Lungs:  clear to auscultation bilaterally, no wheezing, rhonchi or rales Abd: soft, nontender, no hepatomegaly Ext: no edema Skin: warm and dry Neuro:   no focal abnormalities noted  EKG:  ? Atrial pacing, PRWP, anterior MI    ASSESSMENT  AND PLAN:  Coronary atherosclerosis of native coronary artery  Continue Aspirin EC Tablet Delayed Release, 325 MG, 1 tablet as needed, Orally, Once a day Notes: No angina. No CHF.  2. Cardiomyopathy  Continue Benazepril HCl Tablet, 40 MG, TAKE 1 TABLET BY MOUTH EVERY DAY Continue Lasix Tablet, 20 MG, 1 tablet, Orally, Once a day Continue Coreg Tablet, 12.5 MG, 1 tablet, Orally, Twice a day Notes: no active CHF symptoms.  3. Mixed hyperlipidemia  Stopped Zocor Tablet, 80 MG, 1 tablet every evening, Orally, Once a day Started Atorvastatin Calcium Tablet, 40 MG, 1 tablet, Orally, Once a day, 30 day(s), 30, Refills 11 LAB: Statin Panel (Ordered for 10/16/2012)  ALP 97  38-126 - U/L   ALT 17  0-52 - U/L   AST 20  0-39 - U/L   CHOLESTEROL 123  <200 - mg/dL   TRIG 76  0-199 - mg/dL   DLDL 69  0-99 - mg/dL   NON-HDL 78  0-129 - mg/dL   DIRECT HDL 45  30-70 - mg/dL   CHOL/HDL 2.8   2.0-4.0 - Ratio    Yaniv Lage,JAY 08/15/2012 10:51:06 AM > excellent. COntinue current meds. Chaos Carlile,JAY 08/15/2012 10:54:49 AM > i think this lab was supposed to be done a few months after the change to lipitor. Harward,Amy 08/15/2012 12:05:42 PM > Pt noitified. Lab scheduled for 10/16/12.   Notes: LDL target 70. Lipids controlled at last check. Has some mild fatigue so could try atorvastatin 40 mg daily. WOuld check lipids since it has been a year; to see if LDL still at target, and also check LFTs.    Signed, Mina Marble, MD, [facility] 10/08/2013 11:43 AM

## 2013-10-14 ENCOUNTER — Other Ambulatory Visit (INDEPENDENT_AMBULATORY_CARE_PROVIDER_SITE_OTHER): Payer: Medicare Other

## 2013-10-14 DIAGNOSIS — E785 Hyperlipidemia, unspecified: Secondary | ICD-10-CM

## 2013-10-14 LAB — COMPREHENSIVE METABOLIC PANEL
ALBUMIN: 3.7 g/dL (ref 3.5–5.2)
ALT: 16 U/L (ref 0–53)
AST: 16 U/L (ref 0–37)
Alkaline Phosphatase: 85 U/L (ref 39–117)
BILIRUBIN TOTAL: 1 mg/dL (ref 0.2–1.2)
BUN: 12 mg/dL (ref 6–23)
CO2: 27 mEq/L (ref 19–32)
Calcium: 8.9 mg/dL (ref 8.4–10.5)
Chloride: 108 mEq/L (ref 96–112)
Creatinine, Ser: 1.2 mg/dL (ref 0.4–1.5)
GFR: 64.54 mL/min (ref >60.00)
Glucose, Bld: 95 mg/dL (ref 70–99)
POTASSIUM: 4.4 meq/L (ref 3.5–5.1)
Sodium: 140 mEq/L (ref 135–145)
Total Protein: 6.4 g/dL (ref 6.0–8.3)

## 2013-10-14 LAB — LIPID PANEL
CHOLESTEROL: 101 mg/dL (ref 0–200)
HDL: 40.2 mg/dL (ref >39.00)
LDL CALC: 50 mg/dL (ref 0–99)
Total CHOL/HDL Ratio: 3
Triglycerides: 56 mg/dL (ref 0.0–149.0)
VLDL: 11.2 mg/dL (ref 0.0–40.0)

## 2013-10-17 ENCOUNTER — Other Ambulatory Visit: Payer: Self-pay

## 2013-10-17 MED ORDER — CARVEDILOL 12.5 MG PO TABS: 12.5000 mg | ORAL_TABLET | Freq: Two times a day (BID) | ORAL | Status: DC

## 2013-10-17 MED ORDER — ATORVASTATIN CALCIUM 40 MG PO TABS: 40.0000 mg | ORAL_TABLET | Freq: Every day | ORAL | Status: DC

## 2013-10-17 MED ORDER — FUROSEMIDE 20 MG PO TABS: 20.0000 mg | ORAL_TABLET | Freq: Every day | ORAL | Status: DC

## 2013-10-17 MED ORDER — POTASSIUM CHLORIDE CRYS ER 20 MEQ PO TBCR: 20.0000 meq | EXTENDED_RELEASE_TABLET | Freq: Every day | ORAL | Status: DC

## 2013-10-20 ENCOUNTER — Other Ambulatory Visit: Payer: Self-pay | Admitting: *Deleted

## 2013-10-20 MED ORDER — BENAZEPRIL HCL 40 MG PO TABS: 40.0000 mg | ORAL_TABLET | Freq: Every day | ORAL | Status: DC

## 2013-10-24 ENCOUNTER — Other Ambulatory Visit: Payer: Self-pay | Admitting: Cardiology

## 2013-10-24 DIAGNOSIS — E785 Hyperlipidemia, unspecified: Secondary | ICD-10-CM

## 2013-12-08 ENCOUNTER — Encounter: Payer: Medicare Other | Admitting: *Deleted

## 2013-12-08 ENCOUNTER — Telehealth: Payer: Self-pay | Admitting: Cardiology

## 2013-12-08 NOTE — Telephone Encounter (Signed)
LMOVM reminding pt to send remote transmission.   

## 2013-12-09 ENCOUNTER — Encounter: Payer: Self-pay | Admitting: Cardiology

## 2013-12-12 ENCOUNTER — Telehealth: Payer: Self-pay | Admitting: *Deleted

## 2013-12-12 ENCOUNTER — Ambulatory Visit (INDEPENDENT_AMBULATORY_CARE_PROVIDER_SITE_OTHER): Payer: Medicare Other | Admitting: *Deleted

## 2013-12-12 DIAGNOSIS — I498 Other specified cardiac arrhythmias: Secondary | ICD-10-CM

## 2013-12-12 DIAGNOSIS — R001 Bradycardia, unspecified: Secondary | ICD-10-CM

## 2013-12-12 DIAGNOSIS — I2589 Other forms of chronic ischemic heart disease: Secondary | ICD-10-CM

## 2013-12-12 NOTE — Telephone Encounter (Signed)
Gave pt assistance sending manual transmission. Transmission was received.

## 2013-12-16 NOTE — Progress Notes (Signed)
Remote ICD transmission.   

## 2013-12-26 LAB — MDC_IDC_ENUM_SESS_TYPE_REMOTE
Battery Voltage: 2.59 V
Brady Statistic RV Percent Paced: 5.2 %
HighPow Impedance: 81 Ohm
Implantable Pulse Generator Serial Number: 692026
Lead Channel Impedance Value: 430 Ohm
Lead Channel Setting Pacing Amplitude: 2.5 V
MDC IDC MSMT LEADCHNL RA SENSING INTR AMPL: 1.2 mV
MDC IDC MSMT LEADCHNL RV IMPEDANCE VALUE: 400 Ohm
MDC IDC MSMT LEADCHNL RV SENSING INTR AMPL: 11.3 mV
MDC IDC SET LEADCHNL RA PACING AMPLITUDE: 2 V
MDC IDC SET LEADCHNL RV PACING PULSEWIDTH: 0.5 ms
MDC IDC SET LEADCHNL RV SENSING SENSITIVITY: 0.3 mV
MDC IDC SET ZONE DETECTION INTERVAL: 250 ms
MDC IDC SET ZONE DETECTION INTERVAL: 300 ms
MDC IDC STAT BRADY RA PERCENT PACED: 22 %

## 2014-01-27 ENCOUNTER — Encounter: Payer: Self-pay | Admitting: Cardiology

## 2014-02-02 ENCOUNTER — Encounter: Payer: Self-pay | Admitting: Internal Medicine

## 2014-03-19 ENCOUNTER — Encounter: Payer: Self-pay | Admitting: Internal Medicine

## 2014-03-19 ENCOUNTER — Ambulatory Visit (INDEPENDENT_AMBULATORY_CARE_PROVIDER_SITE_OTHER): Payer: Medicare Other | Admitting: Internal Medicine

## 2014-03-19 VITALS — BP 130/68 | HR 56 | Ht 74.0 in | Wt 218.6 lb

## 2014-03-19 DIAGNOSIS — Z95810 Presence of automatic (implantable) cardiac defibrillator: Secondary | ICD-10-CM

## 2014-03-19 DIAGNOSIS — I472 Ventricular tachycardia, unspecified: Secondary | ICD-10-CM

## 2014-03-19 DIAGNOSIS — I255 Ischemic cardiomyopathy: Secondary | ICD-10-CM

## 2014-03-19 LAB — MDC_IDC_ENUM_SESS_TYPE_INCLINIC
Brady Statistic RV Percent Paced: 5.3 %
HighPow Impedance: 81 Ohm
Lead Channel Impedance Value: 390 Ohm
Lead Channel Impedance Value: 430 Ohm
Lead Channel Pacing Threshold Amplitude: 0.5 V
Lead Channel Pacing Threshold Pulse Width: 0.5 ms
Lead Channel Pacing Threshold Pulse Width: 0.5 ms
Lead Channel Sensing Intrinsic Amplitude: 11.3 mV
Lead Channel Sensing Intrinsic Amplitude: 2 mV
Lead Channel Setting Pacing Amplitude: 2.5 V
Lead Channel Setting Pacing Pulse Width: 0.5 ms
Lead Channel Setting Sensing Sensitivity: 0.3 mV
MDC IDC MSMT BATTERY VOLTAGE: 2.6 V
MDC IDC MSMT LEADCHNL RV PACING THRESHOLD AMPLITUDE: 0.75 V
MDC IDC PG SERIAL: 692026
MDC IDC SET LEADCHNL RA PACING AMPLITUDE: 2 V
MDC IDC SET ZONE DETECTION INTERVAL: 300 ms
MDC IDC STAT BRADY RA PERCENT PACED: 22 %
Zone Setting Detection Interval: 250 ms

## 2014-03-19 NOTE — Patient Instructions (Signed)

## 2014-03-19 NOTE — Progress Notes (Signed)
Patient Care Team: Mayra Neer, MD as PCP - General (Family Medicine)   HPI  Mike Cole is a 63 y.o. male seen in followup for ventricular tachycardia/fibrillation presented as an out of hospital cardiac arrest. He  underwent intervention of an occluded LAD but had persistent left ventricular dysfunction prompting ICD implantation.   The patient denies SOB, chest pain edema or palpitations.  There has been no syncope or presyncope.   Past Medical History  Diagnosis Date  . Ventricular tachycardia     arrest, resuscitated  . Acute anterior myocardial infarction     BMS to the LAD  . Cardiogenic shock     resolved  . Ischemic cardiomyopathy     EF of 20-25%  . Dyslipidemia   . History of transient ischemic attack     Past Surgical History  Procedure Laterality Date  . St jude current + dr  10/30/2008    Current Outpatient Prescriptions  Medication Sig Dispense Refill  . aspirin 81 MG tablet Take 81 mg by mouth daily.      [name] atorvastatin (LIPITOR) 40 MG tablet Take 1 tablet (40 mg total) by mouth daily at 6 PM.  90 tablet  3  . benazepril (LOTENSIN) 40 MG tablet Take 1 tablet (40 mg total) by mouth daily.  90 tablet  3  . carvedilol (COREG) 12.5 MG tablet Take 1 tablet (12.5 mg total) by mouth 2 (two) times daily with a meal.  180 tablet  3  . furosemide (LASIX) 20 MG tablet Take 1 tablet (20 mg total) by mouth daily.  90 tablet  3  . potassium chloride SA (K-DUR,KLOR-CON) 20 MEQ tablet Take 1 tablet (20 mEq total) by mouth daily.  90 tablet  3   No current facility-administered medications for this visit.    No Known Allergies  Review of Systems negative except from HPI and PMH  Physical Exam BP 130/68  Pulse 56  Ht 6\' 2"  (1.88 m)  Wt 218 lb 9.6 oz (99.156 kg)  BMI 28.05 kg/m2 Well developed and nourished in no acute distress HENT normal Neck supple with JVP-flat Clear Regular rate and rhythm, 2/6 systolic murmur status post  Abd-soft with active BS No  Clubbing cyanosis edema Skin-warm and dry A & Oriented  Grossly normal sensory and motor function   ECG demonstrates sinus rhythm at 56  Intervals 17/08/42 Prior septal wall MI  Assessment and  Plan  Ischemic cardiomyopathy  Implantable defibrillator-St. Jude The patient's device was interrogated.  The information was reviewed. No changes were made in the programming.     Without symptoms of ischemia

## 2014-06-18 ENCOUNTER — Encounter: Payer: Medicare Other | Admitting: *Deleted

## 2014-06-18 ENCOUNTER — Ambulatory Visit (INDEPENDENT_AMBULATORY_CARE_PROVIDER_SITE_OTHER): Payer: Self-pay | Admitting: *Deleted

## 2014-06-18 ENCOUNTER — Telehealth: Payer: Self-pay | Admitting: Cardiology

## 2014-06-18 DIAGNOSIS — I255 Ischemic cardiomyopathy: Secondary | ICD-10-CM

## 2014-06-18 DIAGNOSIS — I472 Ventricular tachycardia, unspecified: Secondary | ICD-10-CM

## 2014-06-18 NOTE — Telephone Encounter (Signed)
LMOVM reminding pt to send remote transmission.   

## 2014-06-19 ENCOUNTER — Encounter: Payer: Self-pay | Admitting: Cardiology

## 2014-06-22 LAB — MDC_IDC_ENUM_SESS_TYPE_REMOTE
Battery Remaining Longevity: 49 mo
Battery Voltage: 2.59 V
Brady Statistic AP VS Percent: 17 %
Brady Statistic AS VP Percent: 1 %
Date Time Interrogation Session: 20160114070906
HighPow Impedance: 84 Ohm
HighPow Impedance: 84 Ohm
Implantable Pulse Generator Serial Number: 692026
Lead Channel Impedance Value: 380 Ohm
Lead Channel Impedance Value: 400 Ohm
Lead Channel Pacing Threshold Amplitude: 0.5 V
Lead Channel Pacing Threshold Amplitude: 0.75 V
Lead Channel Pacing Threshold Pulse Width: 0.5 ms
Lead Channel Sensing Intrinsic Amplitude: 1.1 mV
Lead Channel Setting Pacing Amplitude: 2 V
Lead Channel Setting Pacing Amplitude: 2.5 V
MDC IDC MSMT LEADCHNL RV PACING THRESHOLD PULSEWIDTH: 0.5 ms
MDC IDC MSMT LEADCHNL RV SENSING INTR AMPL: 11.3 mV
MDC IDC SET LEADCHNL RV PACING PULSEWIDTH: 0.5 ms
MDC IDC SET LEADCHNL RV SENSING SENSITIVITY: 0.3 mV
MDC IDC SET ZONE DETECTION INTERVAL: 250 ms
MDC IDC STAT BRADY AP VP PERCENT: 5.1 %
MDC IDC STAT BRADY AS VS PERCENT: 78 %
MDC IDC STAT BRADY RA PERCENT PACED: 22 %
MDC IDC STAT BRADY RV PERCENT PACED: 5.1 %
Zone Setting Detection Interval: 300 ms

## 2014-06-22 NOTE — Progress Notes (Signed)
Remote ICD transmission.   

## 2014-08-20 ENCOUNTER — Encounter: Payer: Self-pay | Admitting: Cardiology

## 2014-09-02 ENCOUNTER — Other Ambulatory Visit: Payer: Self-pay | Admitting: Internal Medicine

## 2014-09-02 ENCOUNTER — Encounter: Payer: Self-pay | Admitting: Internal Medicine

## 2014-09-17 ENCOUNTER — Ambulatory Visit (INDEPENDENT_AMBULATORY_CARE_PROVIDER_SITE_OTHER): Payer: Medicare Other | Admitting: *Deleted

## 2014-09-17 ENCOUNTER — Encounter: Payer: Self-pay | Admitting: Internal Medicine

## 2014-09-17 DIAGNOSIS — I472 Ventricular tachycardia, unspecified: Secondary | ICD-10-CM

## 2014-09-17 DIAGNOSIS — I255 Ischemic cardiomyopathy: Secondary | ICD-10-CM

## 2014-09-17 LAB — MDC_IDC_ENUM_SESS_TYPE_REMOTE
Battery Remaining Longevity: 49 mo
Battery Voltage: 2.59 V
Brady Statistic AP VP Percent: 4.9 %
Brady Statistic AS VP Percent: 1 %
Brady Statistic RA Percent Paced: 21 %
Date Time Interrogation Session: 20160414114247
HighPow Impedance: 90 Ohm
HighPow Impedance: 90 Ohm
Lead Channel Impedance Value: 460 Ohm
Lead Channel Impedance Value: 540 Ohm
Lead Channel Sensing Intrinsic Amplitude: 11.3 mV
Lead Channel Sensing Intrinsic Amplitude: 3 mV
Lead Channel Setting Pacing Amplitude: 2 V
Lead Channel Setting Pacing Amplitude: 2.5 V
Lead Channel Setting Sensing Sensitivity: 0.3 mV
MDC IDC PG SERIAL: 692026
MDC IDC SET LEADCHNL RV PACING PULSEWIDTH: 0.5 ms
MDC IDC SET ZONE DETECTION INTERVAL: 250 ms
MDC IDC STAT BRADY AP VS PERCENT: 16 %
MDC IDC STAT BRADY AS VS PERCENT: 79 %
MDC IDC STAT BRADY RV PERCENT PACED: 4.9 %
Zone Setting Detection Interval: 300 ms

## 2014-09-17 NOTE — Progress Notes (Signed)
Remote ICD transmission.   

## 2014-10-11 ENCOUNTER — Other Ambulatory Visit: Payer: Self-pay | Admitting: Interventional Cardiology

## 2014-10-13 ENCOUNTER — Encounter: Payer: Self-pay | Admitting: Cardiology

## 2014-10-15 ENCOUNTER — Other Ambulatory Visit (INDEPENDENT_AMBULATORY_CARE_PROVIDER_SITE_OTHER): Payer: Medicare Other

## 2014-10-15 DIAGNOSIS — E785 Hyperlipidemia, unspecified: Secondary | ICD-10-CM | POA: Diagnosis not present

## 2014-10-15 LAB — LIPID PANEL
CHOL/HDL RATIO: 3
Cholesterol: 105 mg/dL (ref 0–200)
HDL: 38.3 mg/dL — ABNORMAL LOW (ref >39.00)
LDL Cholesterol: 58 mg/dL (ref 0–99)
NonHDL: 66.7
Triglycerides: 42 mg/dL (ref 0.0–149.0)
VLDL: 8.4 mg/dL (ref 0.0–40.0)

## 2014-10-15 LAB — COMPREHENSIVE METABOLIC PANEL
ALT: 13 U/L (ref 0–53)
AST: 16 U/L (ref 0–37)
Albumin: 3.6 g/dL (ref 3.5–5.2)
Alkaline Phosphatase: 105 U/L (ref 39–117)
BUN: 15 mg/dL (ref 6–23)
CO2: 28 mEq/L (ref 19–32)
Calcium: 8.8 mg/dL (ref 8.4–10.5)
Chloride: 109 mEq/L (ref 96–112)
Creatinine, Ser: 1.08 mg/dL (ref 0.40–1.50)
GFR: 73.35 mL/min (ref >60.00)
Glucose, Bld: 98 mg/dL (ref 70–99)
Potassium: 4.6 mEq/L (ref 3.5–5.1)
Sodium: 140 mEq/L (ref 135–145)
Total Bilirubin: 0.9 mg/dL (ref 0.2–1.2)
Total Protein: 6.2 g/dL (ref 6.0–8.3)

## 2014-10-22 ENCOUNTER — Encounter: Payer: Self-pay | Admitting: Interventional Cardiology

## 2014-10-22 ENCOUNTER — Ambulatory Visit (INDEPENDENT_AMBULATORY_CARE_PROVIDER_SITE_OTHER): Payer: Medicare Other | Admitting: Interventional Cardiology

## 2014-10-22 VITALS — BP 120/82 | HR 58 | Ht 74.0 in | Wt 220.0 lb

## 2014-10-22 DIAGNOSIS — Z95810 Presence of automatic (implantable) cardiac defibrillator: Secondary | ICD-10-CM | POA: Diagnosis not present

## 2014-10-22 DIAGNOSIS — I2510 Atherosclerotic heart disease of native coronary artery without angina pectoris: Secondary | ICD-10-CM

## 2014-10-22 DIAGNOSIS — I255 Ischemic cardiomyopathy: Secondary | ICD-10-CM | POA: Diagnosis not present

## 2014-10-22 NOTE — Progress Notes (Signed)
Patient ID: Mike Cole, male   DOB: 05/06/1951, 64 y.o.   MRN: [id number]     Cardiology Office Note   Date:  10/22/2014   ID:  Mike Cole, DOB Nov 08, 1950, MRN [id number]  PCP:  Default, Provider, MD    No chief complaint on file.    Wt Readings from Last 3 Encounters:  10/22/14 220 lb (99.791 kg)  03/19/14 218 lb 9.6 oz (99.156 kg)  10/08/13 217 lb 1.9 oz (98.485 kg)       History of Present Illness: Mike Cole is a 64 y.o. male  who had an anterior MI and ischemic cardiomyopathy. He has not been walking during the winter.  He was up to 6 miles a day last summer. He stopped doing this. He will get back to this when the weather is better. He has not had any chest pain. He has some discomfort around the AICD site.  He has no been [facility] recently. CAD/ASCVD:  Denies : Chest pain.  Dyspnea on exertion.  Leg edema.  Nitroglycerin.  Orthopnea.  Palpitations.  Paroxysmal nocturnal dyspnea.  Syncope.  No sx with exertion.  He will try to lose weight.   Past Medical History  Diagnosis Date  . Ventricular tachycardia     arrest, resuscitated  . Acute anterior myocardial infarction     BMS to the LAD  . Cardiogenic shock     resolved  . Ischemic cardiomyopathy     EF of 20-25%  . Dyslipidemia   . History of transient ischemic attack     Past Surgical History  Procedure Laterality Date  . St jude current + dr  10/30/2008     Current Outpatient Prescriptions  Medication Sig Dispense Refill  . aspirin 81 MG tablet Take 81 mg by mouth daily.    [name] atorvastatin (LIPITOR) 40 MG tablet Take 1 tablet (40 mg total) by mouth daily at 6 PM. 90 tablet 3  . benazepril (LOTENSIN) 40 MG tablet TAKE 1 TABLET (40 MG TOTAL) BY MOUTH DAILY. 90 tablet 0  . carvedilol (COREG) 12.5 MG tablet TAKE 1 TABLET BY MOUTH TWICE A DAY 180 tablet 0  . furosemide (LASIX) 20 MG tablet TAKE 1 TABLET BY MOUTH EVERY DAY 90 tablet 0  . KLOR-CON M20 20 MEQ tablet TAKE 1 TABLET BY MOUTH EVERY  DAY 90 tablet 0   No current facility-administered medications for this visit.    Allergies:   Review of patient's allergies indicates no known allergies.    Social History:  The patient  reports that he quit smoking about 6 years ago. His smoking use included Cigarettes. He does not have any smokeless tobacco history on file. He reports that he drinks alcohol. He reports that he does not use illicit drugs.   Family History:  The patient's *family history includes CAD in his brother, brother, father, mother, and sister; Diabetes in his father; Heart disease in his brother.    ROS:  Please see the history of present illness.   Otherwise, review of systems are positive for weight gain.   All other systems are reviewed and negative.    PHYSICAL EXAM: VS:  BP 120/82 mmHg  Pulse 58  Ht 6\' 2"  (1.88 m)  Wt 220 lb (99.791 kg)  BMI 28.23 kg/m2  SpO2 96% , BMI Body mass index is 28.23 kg/(m^2). GEN: Well nourished, well developed, in no acute distress HEENT: normal Neck: no JVD, carotid bruits, or masses Cardiac: *RRR; no murmurs,  rubs, or gallops,no edema  Respiratory:  clear to auscultation bilaterally, normal work of breathing GI: soft, nontender, nondistended, + BS MS: no deformity or atrophy Skin: warm and dry, no rash Neuro:  Strength and sensation are intact Psych: euthymic mood, full affect    Recent Labs: 10/15/2014: ALT 13; BUN 15; Creatinine 1.08; Potassium 4.6; Sodium 140   Lipid Panel    Component Value Date/Time   CHOL 105 10/15/2014 0944   TRIG 42.0 10/15/2014 0944   HDL 38.30* 10/15/2014 0944   CHOLHDL 3 10/15/2014 0944   VLDL 8.4 10/15/2014 0944   LDLCALC 58 10/15/2014 0944     Other studies Reviewed: Additional studies/ records that were reviewed today with results demonstrating: LVEF 40-45% Echo 2010.   ASSESSMENT AND PLAN:  Coronary atherosclerosis of native coronary artery  Continue Aspirin EC Tablet Delayed Release, 325 MG, 1 tablet as needed,  Orally, Once a day Notes: No angina. No CHF.  2. Cardiomyopathy  Continue Benazepril HCl Tablet, 40 MG, TAKE 1 TABLET BY MOUTH EVERY DAY Continue Lasix Tablet, 20 MG, 1 tablet, Orally, Once a day Continue Coreg Tablet, 12.5 MG, 1 tablet, Orally, Twice a day Notes: no active CHF symptoms.  BP controlled. 3. Mixed hyperlipidemia   Started Atorvastatin Calcium Tablet, 40 MG, 1 tablet, Orally, Once a day, 30 day(s), 30, Refills 11         Notes: LDL target 70. Lipids controlled at last check a week ago.        4. AICD checks with Dr, Caryl Comes.    Current medicines are reviewed at length with the patient today.  The patient concerns regarding his medicines were addressed.  The following changes have been made:  No change  Labs/ tests ordered today include:  No orders of the defined types were placed in this encounter.    Recommend 150 minutes/week of aerobic exercise Low fat, low carb, high fiber diet recommended Try to reduce waist size.  Disposition:   FU in 1 year   Teresita Madura., MD  10/22/2014 10:34 AM    [facility], [place], [place]  [address] Phone: [phone]; Fax: [phone]

## 2014-10-22 NOTE — Patient Instructions (Signed)
Medication Instructions:  Your physician recommends that you continue on your current medications as directed. Please refer to the Current Medication list given to you today.   Labwork: None   Testing/Procedures: None   Follow-Up: Your physician wants you to follow-up in: 1 year with Dr.Varanasi You will receive a reminder letter in the mail two months in advance. If you don't receive a letter, please call our office to schedule the follow-up appointment.   Any Other Special Instructions Will Be Listed Below (If Applicable).

## 2014-11-18 DIAGNOSIS — H2513 Age-related nuclear cataract, bilateral: Secondary | ICD-10-CM | POA: Diagnosis not present

## 2014-11-20 ENCOUNTER — Other Ambulatory Visit: Payer: Self-pay | Admitting: Interventional Cardiology

## 2014-12-21 ENCOUNTER — Telehealth: Payer: Self-pay | Admitting: Cardiology

## 2014-12-21 ENCOUNTER — Ambulatory Visit (INDEPENDENT_AMBULATORY_CARE_PROVIDER_SITE_OTHER): Payer: Medicare Other | Admitting: *Deleted

## 2014-12-21 DIAGNOSIS — I255 Ischemic cardiomyopathy: Secondary | ICD-10-CM

## 2014-12-21 NOTE — Telephone Encounter (Signed)
Confirmed remote transmission w/ pt wife.   

## 2014-12-22 ENCOUNTER — Encounter: Payer: Self-pay | Admitting: Cardiology

## 2014-12-23 DIAGNOSIS — I255 Ischemic cardiomyopathy: Secondary | ICD-10-CM

## 2014-12-23 NOTE — Progress Notes (Signed)
Remote ICD transmission.   

## 2015-01-01 LAB — CUP PACEART REMOTE DEVICE CHECK
Battery Remaining Longevity: 35 mo
Battery Voltage: 2.57 V
Brady Statistic AP VP Percent: 5.3 %
Brady Statistic AP VS Percent: 17 %
Brady Statistic RA Percent Paced: 22 %
Date Time Interrogation Session: 20160720101647
HighPow Impedance: 73 Ohm
HighPow Impedance: 73 Ohm
Lead Channel Pacing Threshold Amplitude: 0.5 V
Lead Channel Pacing Threshold Amplitude: 0.75 V
Lead Channel Pacing Threshold Pulse Width: 0.5 ms
Lead Channel Sensing Intrinsic Amplitude: 11.3 mV
Lead Channel Setting Pacing Amplitude: 2 V
Lead Channel Setting Pacing Amplitude: 2.5 V
Lead Channel Setting Sensing Sensitivity: 0.3 mV
MDC IDC MSMT LEADCHNL RA IMPEDANCE VALUE: 400 Ohm
MDC IDC MSMT LEADCHNL RA SENSING INTR AMPL: 1.3 mV
MDC IDC MSMT LEADCHNL RV IMPEDANCE VALUE: 490 Ohm
MDC IDC MSMT LEADCHNL RV PACING THRESHOLD PULSEWIDTH: 0.5 ms
MDC IDC SET LEADCHNL RV PACING PULSEWIDTH: 0.5 ms
MDC IDC SET ZONE DETECTION INTERVAL: 250 ms
MDC IDC STAT BRADY AS VP PERCENT: 1 %
MDC IDC STAT BRADY AS VS PERCENT: 78 %
MDC IDC STAT BRADY RV PERCENT PACED: 5.3 %
Pulse Gen Serial Number: 692026
Zone Setting Detection Interval: 300 ms

## 2015-01-10 ENCOUNTER — Other Ambulatory Visit: Payer: Self-pay | Admitting: Interventional Cardiology

## 2015-01-14 ENCOUNTER — Other Ambulatory Visit: Payer: Self-pay | Admitting: Interventional Cardiology

## 2015-01-19 ENCOUNTER — Encounter: Payer: Self-pay | Admitting: Cardiology

## 2015-01-28 ENCOUNTER — Encounter: Payer: Self-pay | Admitting: Internal Medicine

## 2015-03-30 ENCOUNTER — Encounter: Payer: Self-pay | Admitting: Internal Medicine

## 2015-03-30 ENCOUNTER — Ambulatory Visit (INDEPENDENT_AMBULATORY_CARE_PROVIDER_SITE_OTHER): Payer: Medicare Other | Admitting: Internal Medicine

## 2015-03-30 VITALS — BP 122/64 | HR 50 | Ht 74.0 in | Wt 207.6 lb

## 2015-03-30 DIAGNOSIS — I255 Ischemic cardiomyopathy: Secondary | ICD-10-CM

## 2015-03-30 DIAGNOSIS — R001 Bradycardia, unspecified: Secondary | ICD-10-CM

## 2015-03-30 DIAGNOSIS — Z95810 Presence of automatic (implantable) cardiac defibrillator: Secondary | ICD-10-CM

## 2015-03-30 LAB — CUP PACEART INCLINIC DEVICE CHECK
Battery Remaining Longevity: 37.2
Battery Voltage: 2.57 V
Brady Statistic RA Percent Paced: 23 %
Brady Statistic RV Percent Paced: 6.1 %
Date Time Interrogation Session: 20161025152804
HighPow Impedance: 87.75 Ohm
Implantable Lead Implant Date: 20100527
Implantable Lead Location: 753859
Lead Channel Impedance Value: 412.5 Ohm
Lead Channel Impedance Value: 425 Ohm
Lead Channel Pacing Threshold Amplitude: 0.5 V
Lead Channel Pacing Threshold Amplitude: 1 V
Lead Channel Pacing Threshold Amplitude: 1 V
Lead Channel Pacing Threshold Pulse Width: 0.5 ms
Lead Channel Sensing Intrinsic Amplitude: 1.5 mV
Lead Channel Sensing Intrinsic Amplitude: 11.3 mV
Lead Channel Setting Pacing Amplitude: 2 V
Lead Channel Setting Pacing Pulse Width: 0.5 ms
Lead Channel Setting Sensing Sensitivity: 0.3 mV
MDC IDC LEAD IMPLANT DT: 20100527
MDC IDC LEAD LOCATION: 753860
MDC IDC MSMT LEADCHNL RA PACING THRESHOLD AMPLITUDE: 0.5 V
MDC IDC MSMT LEADCHNL RA PACING THRESHOLD PULSEWIDTH: 0.5 ms
MDC IDC MSMT LEADCHNL RV PACING THRESHOLD PULSEWIDTH: 0.5 ms
MDC IDC MSMT LEADCHNL RV PACING THRESHOLD PULSEWIDTH: 0.5 ms
MDC IDC PG SERIAL: 692026
MDC IDC SET LEADCHNL RV PACING AMPLITUDE: 2.5 V

## 2015-03-30 LAB — BASIC METABOLIC PANEL
BUN: 14 mg/dL (ref 7–25)
CO2: 24 mmol/L (ref 20–31)
CREATININE: 1.17 mg/dL (ref 0.70–1.25)
Calcium: 9.3 mg/dL (ref 8.6–10.3)
Chloride: 105 mmol/L (ref 98–110)
GLUCOSE: 87 mg/dL (ref 65–99)
Potassium: 4.4 mmol/L (ref 3.5–5.3)
Sodium: 141 mmol/L (ref 135–146)

## 2015-03-30 NOTE — Progress Notes (Signed)
Patient Care Team: No Pcp Per Patient as PCP - General (General Practice)   HPI  Mike Cole is a 64 y.o. male seen in followup for ventricular tachycardia/fibrillation presented as an out of hospital cardiac arrest. He  underwent intervention of an occluded LAD but had persistent left ventricular dysfunction prompting ICD implantation.   The patient denies SOB, chest pain edema or palpitations.  There has been no syncope or presyncope. No orthopnea.   Past Medical History  Diagnosis Date  . Ventricular tachycardia (HCC)     arrest, resuscitated  . Acute anterior myocardial infarction (HCC)     BMS to the LAD  . Cardiogenic shock ([place])     resolved  . Ischemic cardiomyopathy     EF of 20-25%  . Dyslipidemia   . History of transient ischemic attack     Past Surgical History  Procedure Laterality Date  . St jude current + dr  10/30/2008    Current Outpatient Prescriptions  Medication Sig Dispense Refill  . aspirin 81 MG tablet Take 81 mg by mouth daily.    [name] atorvastatin (LIPITOR) 40 MG tablet TAKE 1 TABLET BY MOUTH EVERY DAY AT 6PM 90 tablet 3  . benazepril (LOTENSIN) 40 MG tablet TAKE 1 TABLET (40 MG TOTAL) BY MOUTH DAILY. 90 tablet 2  . carvedilol (COREG) 12.5 MG tablet TAKE 1 TABLET BY MOUTH TWICE A DAY 180 tablet 2  . furosemide (LASIX) 20 MG tablet TAKE 1 TABLET BY MOUTH EVERY DAY 90 tablet 2  . KLOR-CON M20 20 MEQ tablet TAKE 1 TABLET BY MOUTH EVERY DAY 90 tablet 2   No current facility-administered medications for this visit.    No Known Allergies  Review of Systems negative except from HPI and PMH  Physical Exam BP 122/64 mmHg  Pulse 50  Ht 6\' 2"  (1.88 m)  Wt 207 lb 9.6 oz (94.167 kg)  BMI 26.64 kg/m2 Well developed and nourished in no acute distress HENT normal Neck supple with JVP-flat Clear Regular rate and rhythm, 2/6 systolic murmur status post  Abd-soft with active BS No Clubbing cyanosis edema Skin-warm and dry A & Oriented  Grossly  normal sensory and motor function   ECG demonstrates sinus at 50 Intervals 15/08/44  Prior septal wall MI  Assessment and  Plan  Ischemic cardiomyopathy  Implantable defibrillator-St. Jude The patient's device was interrogated.  The information was reviewed. No changes were made in the programming.     Without symptoms of ischemia  conitune current meds

## 2015-03-30 NOTE — Patient Instructions (Signed)
Medication Instructions: - no changes  Labwork: - Your physician recommends that you have lab work today: BMP  Procedures/Testing: - none  Follow-Up: - Remote monitoring is used to monitor your Pacemaker of ICD from home. This monitoring reduces the number of office visits required to check your device to one time per year. It allows [place] to keep an eye on the functioning of your device to ensure it is working properly. You are scheduled for a device check from home on 06/29/15. You may send your transmission at any time that day. If you have a wireless device, the transmission will be sent automatically. After your physician reviews your transmission, you will receive a postcard with your next transmission date.  - Your physician wants you to follow-up in: 1 year with Dr. Caryl Comes. You will receive a reminder letter in the mail two months in advance. If you don't receive a letter, please call our office to schedule the follow-up appointment.  Any Additional Special Instructions Will Be Listed Below (If Applicable).

## 2015-04-08 DIAGNOSIS — S99922A Unspecified injury of left foot, initial encounter: Secondary | ICD-10-CM | POA: Diagnosis not present

## 2015-04-08 DIAGNOSIS — S99912A Unspecified injury of left ankle, initial encounter: Secondary | ICD-10-CM | POA: Diagnosis not present

## 2015-06-29 ENCOUNTER — Encounter: Payer: PPO | Admitting: *Deleted

## 2015-06-29 ENCOUNTER — Telehealth: Payer: Self-pay | Admitting: Cardiology

## 2015-06-29 NOTE — Telephone Encounter (Signed)
LMOVM reminding pt to send remote transmission.   

## 2015-06-30 ENCOUNTER — Encounter: Payer: Self-pay | Admitting: Cardiology

## 2015-07-08 ENCOUNTER — Telehealth: Payer: Self-pay | Admitting: Internal Medicine

## 2015-07-08 ENCOUNTER — Ambulatory Visit (INDEPENDENT_AMBULATORY_CARE_PROVIDER_SITE_OTHER): Payer: PPO | Admitting: *Deleted

## 2015-07-08 DIAGNOSIS — I472 Ventricular tachycardia, unspecified: Secondary | ICD-10-CM

## 2015-07-08 DIAGNOSIS — I255 Ischemic cardiomyopathy: Secondary | ICD-10-CM

## 2015-07-08 NOTE — Telephone Encounter (Signed)
Pt rtn call to [facility], doesn't know why transmission didn't go through 06-29-15, pt trying again today pls call [phone]

## 2015-07-08 NOTE — Telephone Encounter (Signed)
LMOVM for pt informing him that we did receive his remote transmission today.

## 2015-07-08 NOTE — Progress Notes (Signed)
Remote ICD transmission.   

## 2015-07-27 LAB — CUP PACEART REMOTE DEVICE CHECK
Battery Remaining Longevity: 35 mo
Battery Voltage: 2.57 V
Brady Statistic AP VP Percent: 6.4 %
Brady Statistic AS VS Percent: 74 %
Brady Statistic RA Percent Paced: 26 %
Date Time Interrogation Session: 20170202135136
HIGH POWER IMPEDANCE MEASURED VALUE: 84 Ohm
HIGH POWER IMPEDANCE MEASURED VALUE: 84 Ohm
Implantable Lead Implant Date: 20100527
Implantable Lead Location: 753859
Implantable Lead Location: 753860
Lead Channel Impedance Value: 450 Ohm
Lead Channel Pacing Threshold Amplitude: 0.5 V
Lead Channel Pacing Threshold Pulse Width: 0.5 ms
Lead Channel Sensing Intrinsic Amplitude: 11.3 mV
Lead Channel Setting Pacing Amplitude: 2 V
Lead Channel Setting Pacing Amplitude: 2.5 V
Lead Channel Setting Sensing Sensitivity: 0.3 mV
MDC IDC LEAD IMPLANT DT: 20100527
MDC IDC MSMT LEADCHNL RA IMPEDANCE VALUE: 450 Ohm
MDC IDC MSMT LEADCHNL RA PACING THRESHOLD PULSEWIDTH: 0.5 ms
MDC IDC MSMT LEADCHNL RA SENSING INTR AMPL: 2.1 mV
MDC IDC MSMT LEADCHNL RV PACING THRESHOLD AMPLITUDE: 1 V
MDC IDC PG SERIAL: 692026
MDC IDC SET LEADCHNL RV PACING PULSEWIDTH: 0.5 ms
MDC IDC STAT BRADY AP VS PERCENT: 20 %
MDC IDC STAT BRADY AS VP PERCENT: 1 %
MDC IDC STAT BRADY RV PERCENT PACED: 6.4 %

## 2015-07-30 ENCOUNTER — Encounter: Payer: Self-pay | Admitting: Cardiology

## 2015-08-06 ENCOUNTER — Telehealth: Payer: Self-pay | Admitting: Interventional Cardiology

## 2015-08-06 NOTE — Telephone Encounter (Signed)
Pt says his insurance is no longer going to cover his [place] M. He needs to talk to you about this.

## 2015-08-06 NOTE — Telephone Encounter (Signed)
Which formulation of potassium will they cover?

## 2015-08-06 NOTE — Telephone Encounter (Signed)
**Note De-Identified [place] [employer] Obfuscation** The pt states that he has switched insurance companies and that his new insurance company, [employer], will not cover [place]. He states that the insurance company advised him to contact his cardiologist and ask if there is another medication that he can take instead. Please advise.

## 2015-08-09 MED ORDER — POTASSIUM CHLORIDE ER 10 MEQ PO CPCR: 20.0000 meq | ORAL_CAPSULE | Freq: Every day | ORAL | Status: DC

## 2015-08-09 NOTE — Telephone Encounter (Addendum)
The pt states that the letter from his insurance company does not mention which form of Potassium they will cover. I called the pts pharmacy, [facility], and was advised to send RX in as Klor Con 10 meq capsules with instructions to take 2 capsules daily and they will put through to see if insurance will cover. RX for Klor Con 10 meq capsules #180 with 1 refill sent in.  The pt is aware.

## 2015-10-07 ENCOUNTER — Telehealth: Payer: Self-pay | Admitting: Cardiology

## 2015-10-07 ENCOUNTER — Ambulatory Visit (INDEPENDENT_AMBULATORY_CARE_PROVIDER_SITE_OTHER): Payer: PPO | Admitting: *Deleted

## 2015-10-07 DIAGNOSIS — I255 Ischemic cardiomyopathy: Secondary | ICD-10-CM | POA: Diagnosis not present

## 2015-10-07 NOTE — Telephone Encounter (Signed)
Spoke with pt and reminded pt of remote transmission that is due today. Pt verbalized understanding.   

## 2015-10-08 NOTE — Progress Notes (Signed)
Remote ICD transmission.   

## 2015-10-09 ENCOUNTER — Other Ambulatory Visit: Payer: Self-pay | Admitting: Interventional Cardiology

## 2015-11-10 ENCOUNTER — Encounter: Payer: Self-pay | Admitting: Cardiology

## 2015-11-16 LAB — CUP PACEART REMOTE DEVICE CHECK
Battery Remaining Longevity: 27 mo
Brady Statistic AP VS Percent: 19 %
Brady Statistic AS VP Percent: 1 %
Brady Statistic AS VS Percent: 74 %
HighPow Impedance: 78 Ohm
HighPow Impedance: 78 Ohm
Implantable Lead Implant Date: 20100527
Implantable Lead Implant Date: 20100527
Implantable Lead Location: 753859
Implantable Lead Location: 753860
Lead Channel Impedance Value: 400 Ohm
Lead Channel Pacing Threshold Amplitude: 0.5 V
Lead Channel Pacing Threshold Pulse Width: 0.5 ms
Lead Channel Sensing Intrinsic Amplitude: 1.4 mV
Lead Channel Setting Pacing Amplitude: 2.5 V
Lead Channel Setting Pacing Pulse Width: 0.5 ms
Lead Channel Setting Sensing Sensitivity: 0.3 mV
MDC IDC MSMT BATTERY VOLTAGE: 2.56 V
MDC IDC MSMT LEADCHNL RV IMPEDANCE VALUE: 360 Ohm
MDC IDC MSMT LEADCHNL RV PACING THRESHOLD AMPLITUDE: 1 V
MDC IDC MSMT LEADCHNL RV PACING THRESHOLD PULSEWIDTH: 0.5 ms
MDC IDC MSMT LEADCHNL RV SENSING INTR AMPL: 11.3 mV
MDC IDC PG SERIAL: 692026
MDC IDC SESS DTM: 20170504190544
MDC IDC SET LEADCHNL RA PACING AMPLITUDE: 2 V
MDC IDC STAT BRADY AP VP PERCENT: 7.3 %
MDC IDC STAT BRADY RA PERCENT PACED: 26 %
MDC IDC STAT BRADY RV PERCENT PACED: 7.3 %

## 2015-11-20 ENCOUNTER — Other Ambulatory Visit: Payer: Self-pay | Admitting: Interventional Cardiology

## 2015-12-23 ENCOUNTER — Other Ambulatory Visit: Payer: Self-pay | Admitting: Interventional Cardiology

## 2016-01-06 ENCOUNTER — Encounter: Payer: PPO | Admitting: *Deleted

## 2016-01-07 ENCOUNTER — Encounter: Payer: Self-pay | Admitting: Cardiology

## 2016-01-07 ENCOUNTER — Telehealth: Payer: Self-pay | Admitting: Cardiology

## 2016-01-07 NOTE — Telephone Encounter (Signed)
LMOVM reminding pt to send remote transmission.   

## 2016-01-14 ENCOUNTER — Telehealth: Payer: Self-pay | Admitting: Interventional Cardiology

## 2016-01-14 NOTE — Telephone Encounter (Signed)
Returned patient call and informed him that we received his transmission this morning. Pt reported the home monitor beeping and working erratically. I gave patient [employer] tech services number, educated him about the ability to request a cellular adapter, and encouraged him to do so. Patient verbalized understanding and will call with any additional concerns.

## 2016-01-14 NOTE — Telephone Encounter (Signed)
New message   Pt wants to let you know that the machine just keeps beeping and won't send a transmission. Please call.

## 2016-02-13 NOTE — Progress Notes (Signed)
Patient ID: Mike Cole, male   DOB: [birth year], 65 y.o.   MRN: [id number]     Cardiology Office Note   Date:  02/14/2016   ID:  Mike Cole, DOB 11/22/1950, MRN [id number]  PCP:  No PCP Per Patient    No chief complaint on file. CAD   Wt Readings from Last 3 Encounters:  02/14/16 213 lb 6.4 oz (96.8 kg)  03/30/15 207 lb 9.6 oz (94.2 kg)  10/22/14 220 lb (99.8 kg)       History of Present Illness: Mike Cole is a 65 y.o. male  who had an anterior MI  In March [id number] complicated by cardiac arrest and subsequent ischemic cardiomyopathy. Last EF was 40-45% in June 2010.  He has not been walking since last year. He stopped doing this. He will get back to this when the weather is better. He has not had any chest pain. No further discomfort around the AICD site.  He has not been [facility] recently. CAD/ASCVD:  Denies : Chest pain. Dyspnea on exertion. Leg edema. Nitroglycerin. Orthopnea. Palpitations. Paroxysmal nocturnal dyspnea. Syncope.  No sx with exertion.  He will try to lose weight.   Past Medical History:  Diagnosis Date  . Acute anterior myocardial infarction (HCC)    BMS to the LAD  . Cardiogenic shock ([place])    resolved  . Dyslipidemia   . History of transient ischemic attack   . Ischemic cardiomyopathy    EF of 20-25%  . Ventricular tachycardia ([place])    arrest, resuscitated    Past Surgical History:  Procedure Laterality Date  . St Jude CURRENT + DR  10/30/2008     Current Outpatient Prescriptions  Medication Sig Dispense Refill  . aspirin 81 MG tablet Take 81 mg by mouth daily.    [name] atorvastatin (LIPITOR) 40 MG tablet TAKE 1 TABLET BY MOUTH EVERY DAY AT 6 PM. OFFICE VISIT REQUIRED FOR FURTHER REFILLS. 30 tablet 2  . benazepril (LOTENSIN) 40 MG tablet TAKE 1 TABLET EVERY DAY 90 tablet 2  . carvedilol (COREG) 12.5 MG tablet TAKE 1 TABLET BY MOUTH TWICE A DAY 180 tablet 2  . furosemide (LASIX) 20 MG tablet TAKE 1 TABLET EVERY DAY 90 tablet 2  . KLOR-CON  M20 20 MEQ tablet TAKE 1 TABLET BY MOUTH EVERY DAY 90 tablet 2  . potassium chloride (KLOR-CON SPRINKLE) 10 MEQ CR capsule Take 2 capsules (20 mEq total) by mouth daily. 180 capsule 1   No current facility-administered medications for this visit.     Allergies:   Review of patient's allergies indicates no known allergies.    Social History:  The patient  reports that he quit smoking about 7 years ago. His smoking use included Cigarettes. He does not have any smokeless tobacco history on file. He reports that he drinks alcohol. He reports that he does not use drugs.   Family History:  The patient's *family history includes CAD in his brother, brother, father, mother, and sister; Diabetes in his father; Heart disease in his brother.    ROS:  Please see the history of present illness.   Otherwise, review of systems are positive for weight gain.   All other systems are reviewed and negative.    PHYSICAL EXAM: VS:  BP 120/76 (BP Location: Right Arm, Patient Position: Sitting, Cuff Size: Normal)   Pulse (!) 50   Ht 6\' 2"  (1.88 m)   Wt 213 lb 6.4 oz (96.8 kg)   BMI 27.40  kg/m  , BMI Body mass index is 27.4 kg/m. GEN: Well nourished, well developed, in no acute distress  HEENT: normal  Neck: no JVD, carotid bruits, or masses Cardiac: RRR; no murmurs, rubs, or gallops,no edema  Respiratory:  clear to auscultation bilaterally, normal work of breathing GI: soft, nontender, nondistended, + BS MS: no deformity or atrophy  Skin: warm and dry, no rash Neuro:  Strength and sensation are intact Psych: euthymic mood, full affect    Recent Labs: 03/30/2015: BUN 14; Creat 1.17; Potassium 4.4; Sodium 141   Lipid Panel    Component Value Date/Time   CHOL 105 10/15/2014 0944   TRIG 42.0 10/15/2014 0944   HDL 38.30 (L) 10/15/2014 0944   CHOLHDL 3 10/15/2014 0944   VLDL 8.4 10/15/2014 0944   LDLCALC 58 10/15/2014 0944     Other studies Reviewed: Additional studies/ records that were  reviewed today with results demonstrating: LVEF 40-45% Echo 2010.   ASSESSMENT AND PLAN:  Coronary atherosclerosis of native coronary artery  Continue Aspirin EC Tablet Delayed Release, 325 MG, 1 tablet as needed, Orally, Once a day Notes: No angina. No CHF.  Has not been cathed since his MI.  EF in 6/10 was 40-45%. 2. Cardiomyopathy - ischemic Continue Benazepril HCl Tablet, 40 MG, TAKE 1 TABLET BY MOUTH EVERY DAY Continue Lasix Tablet, 20 MG, 1 tablet, Orally, Once a day Continue Coreg Tablet, 12.5 MG, 1 tablet, Orally, Twice a day Notes: no active CHF symptoms.  BP controlled.  EF imporoved a few months after the MI.   3. Mixed hyperlipidemia   Continue Atorvastatin Calcium Tablet, 40 MG, 1 tablet, Orally, Once a day, 30 day(s), 30, Refills 11 Recheck lipids today.          Notes: LDL target 70. Lipids controlled at last check a week ago.        4. AICD checks with Dr, Caryl Comes, most recently in 10/16.  He will see him in a few weeks.    Current medicines are reviewed at length with the patient today.  The patient concerns regarding his medicines were addressed.  The following changes have been made:  No change  Labs/ tests ordered today include:  No orders of the defined types were placed in this encounter.   Recommend 150 minutes/week of aerobic exercise Low fat, low carb, high fiber diet recommended Try to reduce waist size.  Disposition:   FU in 1 year   Signed, Larae Grooms, MD  02/14/2016 9:07 AM    [address], [place], [place]  [address] Phone: [phone]; Fax: [phone]

## 2016-02-14 ENCOUNTER — Encounter: Payer: Self-pay | Admitting: Interventional Cardiology

## 2016-02-14 ENCOUNTER — Encounter (INDEPENDENT_AMBULATORY_CARE_PROVIDER_SITE_OTHER): Payer: Self-pay

## 2016-02-14 ENCOUNTER — Ambulatory Visit (INDEPENDENT_AMBULATORY_CARE_PROVIDER_SITE_OTHER): Payer: PPO | Admitting: Interventional Cardiology

## 2016-02-14 VITALS — BP 120/76 | HR 50 | Ht 74.0 in | Wt 213.4 lb

## 2016-02-14 DIAGNOSIS — Z95810 Presence of automatic (implantable) cardiac defibrillator: Secondary | ICD-10-CM | POA: Diagnosis not present

## 2016-02-14 DIAGNOSIS — E785 Hyperlipidemia, unspecified: Secondary | ICD-10-CM

## 2016-02-14 DIAGNOSIS — I252 Old myocardial infarction: Secondary | ICD-10-CM

## 2016-02-14 DIAGNOSIS — I255 Ischemic cardiomyopathy: Secondary | ICD-10-CM | POA: Diagnosis not present

## 2016-02-14 DIAGNOSIS — I2510 Atherosclerotic heart disease of native coronary artery without angina pectoris: Secondary | ICD-10-CM | POA: Diagnosis not present

## 2016-02-14 LAB — COMPREHENSIVE METABOLIC PANEL
ALBUMIN: 4.3 g/dL (ref 3.6–5.1)
ALK PHOS: 106 U/L (ref 40–115)
ALT: 18 U/L (ref 9–46)
AST: 19 U/L (ref 10–35)
BUN: 15 mg/dL (ref 7–25)
CHLORIDE: 106 mmol/L (ref 98–110)
CO2: 28 mmol/L (ref 20–31)
CREATININE: 1.13 mg/dL (ref 0.70–1.25)
Calcium: 9.1 mg/dL (ref 8.6–10.3)
Glucose, Bld: 106 mg/dL — ABNORMAL HIGH (ref 65–99)
POTASSIUM: 4.6 mmol/L (ref 3.5–5.3)
Sodium: 142 mmol/L (ref 135–146)
TOTAL PROTEIN: 6.6 g/dL (ref 6.1–8.1)
Total Bilirubin: 1 mg/dL (ref 0.2–1.2)

## 2016-02-14 LAB — LIPID PANEL
CHOL/HDL RATIO: 2.4 ratio (ref <=5.0)
CHOLESTEROL: 127 mg/dL (ref 125–200)
HDL: 52 mg/dL (ref >=40)
LDL Cholesterol: 62 mg/dL (ref <130)
TRIGLYCERIDES: 63 mg/dL (ref <150)
VLDL: 13 mg/dL (ref <30)

## 2016-02-14 NOTE — Patient Instructions (Signed)
Your physician recommends that you continue on your current medications as directed. Please refer to the Current Medication list given to you today.  Your physician recommends that you return for lab work today (CMET, LIPIDS)  Your physician wants you to follow-up in: 1 year with Dr. Irish Lack.  You will receive a reminder letter in the mail two months in advance. If you don't receive a letter, please call our office to schedule the follow-up appointment.

## 2016-02-21 ENCOUNTER — Telehealth: Payer: Self-pay | Admitting: Interventional Cardiology

## 2016-02-21 DIAGNOSIS — H2513 Age-related nuclear cataract, bilateral: Secondary | ICD-10-CM | POA: Diagnosis not present

## 2016-02-21 DIAGNOSIS — H52223 Regular astigmatism, bilateral: Secondary | ICD-10-CM | POA: Diagnosis not present

## 2016-02-21 DIAGNOSIS — H524 Presbyopia: Secondary | ICD-10-CM | POA: Diagnosis not present

## 2016-02-21 DIAGNOSIS — H5203 Hypermetropia, bilateral: Secondary | ICD-10-CM | POA: Diagnosis not present

## 2016-02-21 NOTE — Telephone Encounter (Signed)
Follow Up:     Returning your call from 02-17-16 please.

## 2016-02-21 NOTE — Telephone Encounter (Signed)
**Note De-identified Mike Cole Obfuscation** LMTCB

## 2016-02-22 NOTE — Telephone Encounter (Signed)
Follow up  Pt voiced he is calling returning nurses call.  Please f/u

## 2016-02-22 NOTE — Telephone Encounter (Signed)
Pt is aware of labs results 

## 2016-03-01 ENCOUNTER — Other Ambulatory Visit: Payer: Self-pay | Admitting: Interventional Cardiology

## 2016-03-12 ENCOUNTER — Other Ambulatory Visit: Payer: Self-pay | Admitting: Interventional Cardiology

## 2016-04-05 ENCOUNTER — Encounter (INDEPENDENT_AMBULATORY_CARE_PROVIDER_SITE_OTHER): Payer: Self-pay

## 2016-04-05 ENCOUNTER — Encounter: Payer: Self-pay | Admitting: Internal Medicine

## 2016-04-05 ENCOUNTER — Ambulatory Visit (INDEPENDENT_AMBULATORY_CARE_PROVIDER_SITE_OTHER): Payer: PPO | Admitting: Internal Medicine

## 2016-04-05 VITALS — BP 106/62 | HR 54 | Ht 73.0 in | Wt 215.0 lb

## 2016-04-05 DIAGNOSIS — I255 Ischemic cardiomyopathy: Secondary | ICD-10-CM | POA: Diagnosis not present

## 2016-04-05 DIAGNOSIS — I219 Acute myocardial infarction, unspecified: Secondary | ICD-10-CM | POA: Diagnosis not present

## 2016-04-05 DIAGNOSIS — Z95810 Presence of automatic (implantable) cardiac defibrillator: Secondary | ICD-10-CM | POA: Diagnosis not present

## 2016-04-05 DIAGNOSIS — R001 Bradycardia, unspecified: Secondary | ICD-10-CM

## 2016-04-05 NOTE — Progress Notes (Signed)
Patient Care Team: No Pcp Per Patient as PCP - General (General Practice)   HPI  Mike Cole is a 65 y.o. male seen in followup for ventricular tachycardia/fibrillation presented as an out of hospital cardiac arrest. He  underwent intervention of an occluded LAD but had persistent left ventricular dysfunction prompting ICD implantation.   The patient denies SOB, chest pain edema or palpitations.  There has been no syncope or presyncope. No orthopnea.  Echocardiogram 5/10 EF 30-35%   Past Medical History:  Diagnosis Date  . Acute anterior myocardial infarction (HCC)    BMS to the LAD  . Cardiogenic shock ([place])    resolved  . Dyslipidemia   . History of transient ischemic attack   . Ischemic cardiomyopathy    EF of 20-25%  . Ventricular tachycardia ([place])    arrest, resuscitated    Past Surgical History:  Procedure Laterality Date  . St Jude CURRENT + DR  10/30/2008    Current Outpatient Prescriptions  Medication Sig Dispense Refill  . aspirin 81 MG tablet Take 81 mg by mouth daily.    [name] atorvastatin (LIPITOR) 40 MG tablet Take 1 tablet (40 mg total) by mouth daily at 6 PM. 30 tablet 11  . benazepril (LOTENSIN) 40 MG tablet Take 40 mg by mouth daily.    . carvedilol (COREG) 12.5 MG tablet Take 12.5 mg by mouth 2 (two) times daily with a meal.    . furosemide (LASIX) 20 MG tablet Take 20 mg by mouth daily.    . potassium chloride (MICRO-K) 10 MEQ CR capsule TAKE 2 CAPSULES BY MOUTH EVERY DAY 180 capsule 3   No current facility-administered medications for this visit.     No Known Allergies  Review of Systems negative except from HPI and PMH  Physical Exam BP 106/62   Pulse (!) 54   Ht 6\' 1"  (1.854 m)   Wt 215 lb (97.5 kg)   SpO2 98%   BMI 28.37 kg/m  Well developed and nourished in no acute distress HENT normal Neck supple with JVP-flat Clear Regular rate and rhythm, 2/6 systolic murmur status post  Abd-soft with active BS No Clubbing cyanosis  edema Skin-warm and dry A & Oriented  Grossly normal sensory and motor function   ECG demonstrates sinus at 50 Intervals 15/08/44  Prior septal wall MI  Assessment and  Plan  Ischemic cardiomyopathy  Implantable defibrillator-St. Jude The patient's device was interrogated.  The information was reviewed. No changes were made in the programming.     Without symptoms of ischemia  Device is approaching ERI. We will obtain an echocardiogram to look at LV function.

## 2016-04-05 NOTE — Patient Instructions (Signed)
Medication Instructions: Your physician recommends that you continue on your current medications as directed. Please refer to the Current Medication list given to you today.   Labwork: None Ordered  Procedures/Testing: Your physician has requested that you have an echocardiogram. Echocardiography is a painless test that uses sound waves to create images of your heart. It provides your doctor with information about the size and shape of your heart and how well your heart's chambers and valves are working. This procedure takes approximately one hour. There are no restrictions for this procedure.  Your physician recommends that you have a generator change on your device as you are reaching end of Life on your battery. When you are ready to schedule that procedure please contact the office at [phone] and ask for [facility], Dr. Olin Pia nurse to schedule.   Follow-Up: Your physician recommends that you schedule a follow-up appointment as needed with Dr. Caryl Comes.   Any Additional Special Instructions Will Be Listed Below (If Applicable).     If you need a refill on your cardiac medications before your next appointment, please call your pharmacy.

## 2016-04-06 LAB — CUP PACEART INCLINIC DEVICE CHECK
Date Time Interrogation Session: 20171102122747
Implantable Lead Implant Date: 20100527
Implantable Lead Implant Date: 20100527
Implantable Lead Location: 753859
MDC IDC LEAD LOCATION: 753860
MDC IDC PG IMPLANT DT: 20100527
Pulse Gen Serial Number: 692026

## 2016-04-10 ENCOUNTER — Other Ambulatory Visit: Payer: Self-pay

## 2016-04-10 ENCOUNTER — Ambulatory Visit (HOSPITAL_COMMUNITY): Payer: PPO | Attending: Cardiovascular Disease

## 2016-04-10 ENCOUNTER — Encounter (INDEPENDENT_AMBULATORY_CARE_PROVIDER_SITE_OTHER): Payer: Self-pay

## 2016-04-10 DIAGNOSIS — I252 Old myocardial infarction: Secondary | ICD-10-CM | POA: Insufficient documentation

## 2016-04-10 DIAGNOSIS — I219 Acute myocardial infarction, unspecified: Secondary | ICD-10-CM

## 2016-04-10 DIAGNOSIS — Z95810 Presence of automatic (implantable) cardiac defibrillator: Secondary | ICD-10-CM | POA: Diagnosis not present

## 2016-04-10 DIAGNOSIS — I351 Nonrheumatic aortic (valve) insufficiency: Secondary | ICD-10-CM | POA: Diagnosis not present

## 2016-04-10 DIAGNOSIS — I517 Cardiomegaly: Secondary | ICD-10-CM | POA: Diagnosis not present

## 2016-04-10 DIAGNOSIS — R001 Bradycardia, unspecified: Secondary | ICD-10-CM | POA: Diagnosis not present

## 2016-04-10 DIAGNOSIS — I255 Ischemic cardiomyopathy: Secondary | ICD-10-CM | POA: Insufficient documentation

## 2016-04-10 DIAGNOSIS — I071 Rheumatic tricuspid insufficiency: Secondary | ICD-10-CM | POA: Diagnosis not present

## 2016-04-10 DIAGNOSIS — R29898 Other symptoms and signs involving the musculoskeletal system: Secondary | ICD-10-CM | POA: Insufficient documentation

## 2016-05-08 ENCOUNTER — Telehealth: Payer: Self-pay | Admitting: Internal Medicine

## 2016-05-08 ENCOUNTER — Ambulatory Visit: Payer: PPO | Admitting: *Deleted

## 2016-05-08 DIAGNOSIS — I255 Ischemic cardiomyopathy: Secondary | ICD-10-CM

## 2016-05-08 NOTE — Telephone Encounter (Signed)
Pt stated that device was vibrating, pt instructed to send in a manual transmission and scheduled for device clinic apt to turn off vibratory alert today at 2:00pm.

## 2016-05-08 NOTE — Telephone Encounter (Signed)
New message       1. Has your device fired? no 2. Is you device beeping?  no 3. Are you experiencing draining or swelling at device site? no 4. Are you calling to see if we received your device transmission? no 5. Have you passed out? No Device is vibrating

## 2016-05-09 LAB — CUP PACEART INCLINIC DEVICE CHECK
Brady Statistic RA Percent Paced: 18 %
Brady Statistic RV Percent Paced: 4.5 %
HighPow Impedance: 81 Ohm
Implantable Lead Implant Date: 20100527
Implantable Lead Location: 753860
Lead Channel Pacing Threshold Amplitude: 0.5 V
Lead Channel Pacing Threshold Amplitude: 1 V
Lead Channel Pacing Threshold Pulse Width: 0.5 ms
Lead Channel Pacing Threshold Pulse Width: 0.5 ms
Lead Channel Sensing Intrinsic Amplitude: 1.4 mV
Lead Channel Setting Pacing Amplitude: 2 V
Lead Channel Setting Pacing Amplitude: 2.5 V
Lead Channel Setting Pacing Pulse Width: 0.5 ms
MDC IDC LEAD IMPLANT DT: 20100527
MDC IDC LEAD LOCATION: 753859
MDC IDC MSMT BATTERY VOLTAGE: 2.41 V
MDC IDC MSMT LEADCHNL RA IMPEDANCE VALUE: 425 Ohm
MDC IDC MSMT LEADCHNL RV IMPEDANCE VALUE: 425 Ohm
MDC IDC MSMT LEADCHNL RV SENSING INTR AMPL: 11.3 mV
MDC IDC PG IMPLANT DT: 20100527
MDC IDC PG SERIAL: 692026
MDC IDC SESS DTM: 20171204190441
MDC IDC SET LEADCHNL RV SENSING SENSITIVITY: 0.3 mV

## 2016-05-09 NOTE — Progress Notes (Signed)
ICD check in clinic. Normal device function. Thresholds and sensing consistent with previous device measurements. Impedance trends stable over time. No evidence of any ventricular arrhythmias. No mode switches. Histogram distribution appropriate for patient and level of activity. No changes made this session. Device programmed at appropriate safety margins. Device programmed to optimize intrinsic conduction. Device reached ERI 05/08/2016. Mike Cole will contact to schedule gen change.

## 2016-06-07 ENCOUNTER — Telehealth: Payer: Self-pay | Admitting: Internal Medicine

## 2016-06-07 DIAGNOSIS — Z01812 Encounter for preprocedural laboratory examination: Secondary | ICD-10-CM

## 2016-06-07 DIAGNOSIS — I255 Ischemic cardiomyopathy: Secondary | ICD-10-CM

## 2016-06-07 NOTE — Telephone Encounter (Signed)
New Message    Returning call to [facility] about device that was turned off.

## 2016-06-09 ENCOUNTER — Encounter: Payer: Self-pay | Admitting: *Deleted

## 2016-06-09 NOTE — Telephone Encounter (Signed)
I called and spoke with the patient to arrange his ICD generator change out. This has been scheduled for 07/07/16 at 9:00 am with Dr. Caryl Comes. He will come on 06/26/16 for his lab work. Letter of instructions to be mailed to the patient.

## 2016-06-26 ENCOUNTER — Other Ambulatory Visit: Payer: PPO | Admitting: *Deleted

## 2016-06-26 DIAGNOSIS — I255 Ischemic cardiomyopathy: Secondary | ICD-10-CM | POA: Diagnosis not present

## 2016-06-26 DIAGNOSIS — Z01812 Encounter for preprocedural laboratory examination: Secondary | ICD-10-CM

## 2016-06-27 ENCOUNTER — Telehealth: Payer: Self-pay | Admitting: *Deleted

## 2016-06-27 LAB — CBC WITH DIFFERENTIAL/PLATELET
BASOS: 1 %
Basophils Absolute: 0 10*3/uL (ref 0.0–0.2)
EOS (ABSOLUTE): 0.2 10*3/uL (ref 0.0–0.4)
EOS: 5 %
Hematocrit: 39.3 % (ref 37.5–51.0)
Hemoglobin: 13.2 g/dL (ref 13.0–17.7)
IMMATURE GRANS (ABS): 0 10*3/uL (ref 0.0–0.1)
IMMATURE GRANULOCYTES: 0 %
LYMPHS: 41 %
Lymphocytes Absolute: 1.7 10*3/uL (ref 0.7–3.1)
MCH: 31.6 pg (ref 26.6–33.0)
MCHC: 33.6 g/dL (ref 31.5–35.7)
MCV: 94 fL (ref 79–97)
Monocytes Absolute: 0.3 10*3/uL (ref 0.1–0.9)
Monocytes: 8 %
NEUTROS PCT: 45 %
Neutrophils Absolute: 1.9 10*3/uL (ref 1.4–7.0)
PLATELETS: 151 10*3/uL (ref 150–379)
RBC: 4.18 x10E6/uL (ref 4.14–5.80)
RDW: 13.8 % (ref 12.3–15.4)
WBC: 4.2 10*3/uL (ref 3.4–10.8)

## 2016-06-27 LAB — PROTIME-INR
INR: 1 (ref 0.8–1.2)
Prothrombin Time: 10.4 s (ref 9.1–12.0)

## 2016-06-27 LAB — BASIC METABOLIC PANEL
BUN/Creatinine Ratio: 13 (ref 10–24)
BUN: 15 mg/dL (ref 8–27)
CALCIUM: 8.8 mg/dL (ref 8.6–10.2)
CO2: 24 mmol/L (ref 18–29)
CREATININE: 1.18 mg/dL (ref 0.76–1.27)
Chloride: 107 mmol/L — ABNORMAL HIGH (ref 96–106)
GFR calc Af Amer: 75 mL/min/{1.73_m2} (ref >59)
GFR calc non Af Amer: 65 mL/min/{1.73_m2} (ref >59)
Glucose: 101 mg/dL — ABNORMAL HIGH (ref 65–99)
POTASSIUM: 4.6 mmol/L (ref 3.5–5.2)
Sodium: 144 mmol/L (ref 134–144)

## 2016-06-27 NOTE — Telephone Encounter (Signed)
error 

## 2016-07-05 ENCOUNTER — Encounter: Payer: PPO | Admitting: *Deleted

## 2016-07-05 ENCOUNTER — Telehealth: Payer: Self-pay | Admitting: Cardiology

## 2016-07-05 NOTE — Telephone Encounter (Signed)
LMOVM reminding pt to send remote transmission.   

## 2016-07-06 ENCOUNTER — Encounter: Payer: Self-pay | Admitting: Cardiology

## 2016-07-07 ENCOUNTER — Encounter (HOSPITAL_COMMUNITY)
Admission: RE | Disposition: A | Discharge status: Home / Self Care | Payer: Self-pay | Source: Ambulatory Visit | Attending: Internal Medicine

## 2016-07-07 ENCOUNTER — Encounter (HOSPITAL_COMMUNITY): Payer: Self-pay | Admitting: Internal Medicine

## 2016-07-07 ENCOUNTER — Ambulatory Visit (HOSPITAL_COMMUNITY)
Admission: RE | Admit: 2016-07-07 | Discharge: 2016-07-07 | Disposition: A | Discharge status: Home / Self Care | Payer: PPO | Source: Ambulatory Visit | Attending: Internal Medicine | Admitting: Internal Medicine

## 2016-07-07 DIAGNOSIS — R001 Bradycardia, unspecified: Secondary | ICD-10-CM | POA: Diagnosis present

## 2016-07-07 DIAGNOSIS — Z8674 Personal history of sudden cardiac arrest: Secondary | ICD-10-CM | POA: Diagnosis not present

## 2016-07-07 DIAGNOSIS — I252 Old myocardial infarction: Secondary | ICD-10-CM | POA: Insufficient documentation

## 2016-07-07 DIAGNOSIS — I255 Ischemic cardiomyopathy: Secondary | ICD-10-CM | POA: Diagnosis not present

## 2016-07-07 DIAGNOSIS — Z8673 Personal history of transient ischemic attack (TIA), and cerebral infarction without residual deficits: Secondary | ICD-10-CM | POA: Diagnosis not present

## 2016-07-07 DIAGNOSIS — Z87891 Personal history of nicotine dependence: Secondary | ICD-10-CM | POA: Insufficient documentation

## 2016-07-07 DIAGNOSIS — I472 Ventricular tachycardia, unspecified: Secondary | ICD-10-CM

## 2016-07-07 DIAGNOSIS — Z8249 Family history of ischemic heart disease and other diseases of the circulatory system: Secondary | ICD-10-CM | POA: Insufficient documentation

## 2016-07-07 DIAGNOSIS — E785 Hyperlipidemia, unspecified: Secondary | ICD-10-CM | POA: Diagnosis not present

## 2016-07-07 DIAGNOSIS — Z4502 Encounter for adjustment and management of automatic implantable cardiac defibrillator: Secondary | ICD-10-CM

## 2016-07-07 DIAGNOSIS — Z95810 Presence of automatic (implantable) cardiac defibrillator: Secondary | ICD-10-CM | POA: Diagnosis present

## 2016-07-07 HISTORY — PX: EP IMPLANTABLE DEVICE: SHX172B

## 2016-07-07 LAB — SURGICAL PCR SCREEN
MRSA, PCR: NEGATIVE
Staphylococcus aureus: NEGATIVE

## 2016-07-07 SURGERY — ICD GENERATOR CHANGEOUT
Anesthesia: LOCAL

## 2016-07-07 MED ORDER — ACETAMINOPHEN 325 MG PO TABS: 325.0000 mg | ORAL_TABLET | ORAL | Status: DC | PRN

## 2016-07-07 MED ORDER — MUPIROCIN 2 % EX OINT
TOPICAL_OINTMENT | CUTANEOUS | Status: AC
  Administered 2016-07-07: 1 via TOPICAL
  Filled 2016-07-07: qty 22

## 2016-07-07 MED ORDER — SODIUM CHLORIDE 0.9 % IR SOLN
80.0000 mg | Status: AC
  Administered 2016-07-07: 80 mg

## 2016-07-07 MED ORDER — ONDANSETRON HCL 4 MG/2ML IJ SOLN: 4.0000 mg | Freq: Four times a day (QID) | INTRAMUSCULAR | Status: DC | PRN

## 2016-07-07 MED ORDER — MIDAZOLAM HCL 5 MG/5ML IJ SOLN
INTRAMUSCULAR | Status: AC
  Filled 2016-07-07: qty 5

## 2016-07-07 MED ORDER — CEFAZOLIN SODIUM-DEXTROSE 2-4 GM/100ML-% IV SOLN
INTRAVENOUS | Status: AC
  Filled 2016-07-07: qty 100

## 2016-07-07 MED ORDER — LIDOCAINE HCL (PF) 1 % IJ SOLN
INTRAMUSCULAR | Status: AC
  Filled 2016-07-07: qty 30

## 2016-07-07 MED ORDER — SODIUM CHLORIDE 0.9 % IR SOLN
Status: AC
  Filled 2016-07-07: qty 2

## 2016-07-07 MED ORDER — SODIUM CHLORIDE 0.9 % IV SOLN
INTRAVENOUS | Status: DC
  Administered 2016-07-07: 08:00:00 via INTRAVENOUS

## 2016-07-07 MED ORDER — SODIUM CHLORIDE 0.9 % IV SOLN: INTRAVENOUS | Status: AC

## 2016-07-07 MED ORDER — LIDOCAINE HCL (PF) 1 % IJ SOLN
INTRAMUSCULAR | Status: DC | PRN
  Administered 2016-07-07: 48 mL

## 2016-07-07 MED ORDER — CEFAZOLIN SODIUM-DEXTROSE 2-4 GM/100ML-% IV SOLN
2.0000 g | INTRAVENOUS | Status: AC
  Administered 2016-07-07: 2 g via INTRAVENOUS

## 2016-07-07 MED ORDER — MUPIROCIN 2 % EX OINT
1.0000 "application " | TOPICAL_OINTMENT | Freq: Once | CUTANEOUS | Status: AC
  Administered 2016-07-07: 1 via TOPICAL

## 2016-07-07 MED ORDER — CHLORHEXIDINE GLUCONATE 4 % EX LIQD
60.0000 mL | Freq: Once | CUTANEOUS | Status: DC
  Filled 2016-07-07: qty 60

## 2016-07-07 MED ORDER — HEPARIN (PORCINE) IN NACL 2-0.9 UNIT/ML-% IJ SOLN
INTRAMUSCULAR | Status: DC | PRN
  Administered 2016-07-07: 500 mL

## 2016-07-07 MED ORDER — MIDAZOLAM HCL 5 MG/5ML IJ SOLN
INTRAMUSCULAR | Status: DC | PRN
  Administered 2016-07-07: 2 mg via INTRAVENOUS
  Administered 2016-07-07 (×4): 1 mg via INTRAVENOUS

## 2016-07-07 MED ORDER — FENTANYL CITRATE (PF) 100 MCG/2ML IJ SOLN
INTRAMUSCULAR | Status: DC | PRN
  Administered 2016-07-07 (×4): 25 ug via INTRAVENOUS

## 2016-07-07 MED ORDER — FENTANYL CITRATE (PF) 100 MCG/2ML IJ SOLN
INTRAMUSCULAR | Status: AC
  Filled 2016-07-07: qty 2

## 2016-07-07 SURGICAL SUPPLY — 5 items
CABLE SURGICAL S-101-97-12 (CABLE) ×1 IMPLANT
HEMOSTAT SURGICEL 2X4 FIBR (HEMOSTASIS) ×1 IMPLANT
ICD ELLIPSE DR CD2411-36Q (ICD Generator) ×1 IMPLANT
PAD DEFIB LIFELINK (PAD) ×1 IMPLANT
TRAY PACEMAKER INSERTION (PACKS) ×1 IMPLANT

## 2016-07-07 NOTE — H&P (Signed)
Patient Care Team: No Pcp Per Patient as PCP - General (General Practice)   HPI  Mike Cole is a 66 y.o. male Admitted for  ventricular tachycardia/fibrillation presented as an out of hospital cardiac arrest. He  underwent intervention of an occluded LAD but had persistent left ventricular dysfunction prompting ICD implantation.   The patient denies SOB, chest pain edema or palpitations.  There has been no syncope or presyncope. No orthopnea.  Echocardiogram 5/10 EF 30-35% Echo 2017 EF 30-35%     Records and Results Reviewed    Past Medical History:  Diagnosis Date  . Acute anterior myocardial infarction (HCC)    BMS to the LAD  . Cardiogenic shock ([place])    resolved  . Dyslipidemia   . History of transient ischemic attack   . Ischemic cardiomyopathy    EF of 20-25%  . Ventricular tachycardia ([place])    arrest, resuscitated    Past Surgical History:  Procedure Laterality Date  . St Jude CURRENT + DR  10/30/2008    Current Facility-Administered Medications  Medication Dose Route Frequency Provider Last Rate Last Dose  . 0.9 %  sodium chloride infusion   Intravenous Continuous Patsey Berthold, NP 50 mL/hr at 07/07/16 0755    . ceFAZolin (ANCEF) IVPB 2g/100 mL premix  2 g Intravenous On Call Amber Sena Slate, NP      . chlorhexidine (HIBICLENS) 4 % liquid 4 application  60 mL Topical Once Amber Sena Slate, NP      . gentamicin (GARAMYCIN) 80 mg in sodium chloride irrigation 0.9 % 500 mL irrigation  80 mg Irrigation On Call Patsey Berthold, NP        No Known Allergies    Social History  Substance Use Topics  . Smoking status: Former Smoker    Types: Cigarettes    Quit date: 06/05/2008  . Smokeless tobacco: Never Used  . Alcohol use Yes     Comment: occ     Family History  Problem Relation Age of Onset  . CAD Mother   . CAD Father   . Diabetes Father   . CAD Sister   . CAD Brother   . Heart disease Brother     30's, quadruple bypass  . CAD Brother        No current facility-administered medications on file prior to encounter.    Current Outpatient Prescriptions on File Prior to Encounter  Medication Sig Dispense Refill  . atorvastatin (LIPITOR) 40 MG tablet Take 1 tablet (40 mg total) by mouth daily at 6 PM. 30 tablet 11  . benazepril (LOTENSIN) 40 MG tablet Take 40 mg by mouth every evening.     . carvedilol (COREG) 12.5 MG tablet Take 12.5 mg by mouth 2 (two) times daily.     . furosemide (LASIX) 20 MG tablet Take 20 mg by mouth daily.    . potassium chloride (MICRO-K) 10 MEQ CR capsule TAKE 2 CAPSULES BY MOUTH EVERY DAY 180 capsule 3      Review of Systems negative except from HPI and PMH  Physical Exam BP 117/76   Pulse (!) 56   Temp 97.9 F (36.6 C) (Oral)   Resp 18   Ht 6\' 2"  (1.88 m)   Wt 212 lb (96.2 kg)   SpO2 100%   BMI 27.22 kg/m  Well developed and well nourished in no acute distress HENT normal E scleral and icterus clear Neck Supple JVP flat; carotids brisk  and full Clear to ausculation  Regular rate and rhythm, no murmurs gallops or rub Soft with active bowel sounds No clubbing cyanosis  Edema Alert and oriented, grossly normal motor and sensory function Skin Warm and Dry    Assessment and  Plan   Ischemic cardiomyopathy  Implantable defibrillator-St. Jude The patient's device was interrogated.       We have reviewed the benefits and risks of generator replacement.  These include but are not limited to lead fracture and infection.  The patient understands, agrees and is willing to proceed.

## 2016-07-07 NOTE — Discharge Instructions (Signed)
Pacemaker Battery Change, Care After °Refer to this sheet in the next few weeks. These instructions provide you with information on caring for yourself after your procedure. Your health care provider may also give you more specific instructions. Your treatment has been planned according to current medical practices, but problems sometimes occur. Call your health care provider if you have any problems or questions after your procedure. °WHAT TO EXPECT AFTER THE PROCEDURE °After your procedure, it is typical to have the following sensations: °· Soreness at the pacemaker site. °HOME CARE INSTRUCTIONS  °· Keep the incision clean and dry. °· Unless advised otherwise, you may shower beginning 48 hours after your procedure. °· For the first week after the replacement, avoid stretching motions that pull at the incision site, and avoid heavy exercise with the arm that is on the same side as the incision. °· Take medicines only as directed by your health care provider. °· Keep all follow-up visits as directed by your health care provider. °SEEK MEDICAL CARE IF:  °· You have pain at the incision site that is not relieved by over-the-counter or prescription medicine. °· There is drainage or pus from the incision site. °· There is swelling larger than a lime at the incision site. °· You develop red streaking that extends above or below the incision site. °· You feel brief, intermittent palpitations, light-headedness, or any symptoms that you feel might be related to your heart. °SEEK IMMEDIATE MEDICAL CARE IF:  °· You experience chest pain that is different than the pain at the pacemaker site. °· You experience shortness of breath. °· You have palpitations or irregular heartbeat. °· You have light-headedness that does not go away quickly. °· You faint. °· You have pain that gets worse and is not relieved by medicine. °This information is not intended to replace advice given to you by your health care provider. Make sure you  discuss any questions you have with your health care provider. °Document Released: 03/12/2013 Document Revised: 06/12/2014 Document Reviewed: 03/12/2013 °Elsevier Interactive Patient Education © 2017 [employer] ° °

## 2016-07-07 NOTE — Progress Notes (Signed)
ICD Criteria  Current LVEF:30%. Within 12 months prior to implant: Yes   Heart failure history: Yes, Class II  Cardiomyopathy history: Yes, Ischemic Cardiomyopathy - Prior MI.  Atrial Fibrillation/Atrial Flutter: No.  Ventricular tachycardia history: Yes, Hemodynamic instability present. VT Type: Sustained Ventricular Tachycardia - Monomorphic.  Cardiac arrest history: No.  History of syndromes with risk of sudden death: No.  Previous ICD: Yes, Reason for ICD:  Secondary prevention.  Current ICD indication: Secondary  PPM indication: Yes. Pacing type: Atrial. Greater than 40% RV pacing requirement anticipated. Indication: Sick Sinus Syndrome   Class I or II Bradycardia indication present: Yes  Beta Blocker therapy for 3 or more months: Yes, prescribed.   Ace Inhibitor/ARB therapy for 3 or more months: Yes, prescribed.

## 2016-07-17 ENCOUNTER — Ambulatory Visit (INDEPENDENT_AMBULATORY_CARE_PROVIDER_SITE_OTHER): Payer: PPO | Admitting: *Deleted

## 2016-07-17 DIAGNOSIS — I255 Ischemic cardiomyopathy: Secondary | ICD-10-CM

## 2016-07-17 NOTE — Progress Notes (Signed)
Wound check appointment. Dermabond removed. Wound without redness or edema. Incision edges approximated, wound well healed. Normal device function. Thresholds, sensing, and impedances consistent with implant measurements. Device programmed at auto capture programmed on. Histogram distribution appropriate for patient and level of activity. No mode switches or high ventricular rates noted. Patient educated about wound care. ROV with SK scheduled for 66mo.

## 2016-07-19 ENCOUNTER — Other Ambulatory Visit: Payer: Self-pay | Admitting: Interventional Cardiology

## 2016-07-28 ENCOUNTER — Encounter: Payer: Self-pay | Admitting: Cardiology

## 2016-07-28 NOTE — Progress Notes (Signed)
Letter  

## 2016-10-27 ENCOUNTER — Ambulatory Visit (INDEPENDENT_AMBULATORY_CARE_PROVIDER_SITE_OTHER): Payer: PPO | Admitting: Internal Medicine

## 2016-10-27 ENCOUNTER — Encounter: Payer: Self-pay | Admitting: Internal Medicine

## 2016-10-27 ENCOUNTER — Encounter (INDEPENDENT_AMBULATORY_CARE_PROVIDER_SITE_OTHER): Payer: Self-pay

## 2016-10-27 VITALS — BP 98/60 | HR 60 | Ht 73.0 in | Wt 214.0 lb

## 2016-10-27 DIAGNOSIS — I472 Ventricular tachycardia, unspecified: Secondary | ICD-10-CM

## 2016-10-27 DIAGNOSIS — Z95810 Presence of automatic (implantable) cardiac defibrillator: Secondary | ICD-10-CM

## 2016-10-27 DIAGNOSIS — I4901 Ventricular fibrillation: Secondary | ICD-10-CM | POA: Diagnosis not present

## 2016-10-27 DIAGNOSIS — T82110A Breakdown (mechanical) of cardiac electrode, initial encounter: Secondary | ICD-10-CM

## 2016-10-27 DIAGNOSIS — I255 Ischemic cardiomyopathy: Secondary | ICD-10-CM | POA: Diagnosis not present

## 2016-10-27 LAB — CUP PACEART INCLINIC DEVICE CHECK
Date Time Interrogation Session: 20180525124714
HighPow Impedance: 73.125
Implantable Lead Implant Date: 20100527
Implantable Lead Location: 753859
Implantable Lead Location: 753860
Implantable Pulse Generator Implant Date: 20180202
Lead Channel Pacing Threshold Amplitude: 1.25 V
Lead Channel Pacing Threshold Pulse Width: 0.5 ms
Lead Channel Sensing Intrinsic Amplitude: 11.7 mV
Lead Channel Setting Pacing Amplitude: 1.375
Lead Channel Setting Pacing Amplitude: 2 V
Lead Channel Setting Pacing Pulse Width: 0.5 ms
Lead Channel Setting Sensing Sensitivity: 0.5 mV
MDC IDC LEAD IMPLANT DT: 20100527
MDC IDC MSMT LEADCHNL RA IMPEDANCE VALUE: 400 Ohm
MDC IDC MSMT LEADCHNL RA PACING THRESHOLD AMPLITUDE: 1 V
MDC IDC MSMT LEADCHNL RA PACING THRESHOLD PULSEWIDTH: 0.5 ms
MDC IDC MSMT LEADCHNL RA SENSING INTR AMPL: 0.4 mV
MDC IDC MSMT LEADCHNL RV IMPEDANCE VALUE: 437.5 Ohm
MDC IDC STAT BRADY RA PERCENT PACED: 72 %
MDC IDC STAT BRADY RV PERCENT PACED: 61 %
Pulse Gen Serial Number: 7402114

## 2016-10-27 MED ORDER — CARVEDILOL 3.125 MG PO TABS: 3.1250 mg | ORAL_TABLET | Freq: Two times a day (BID) | ORAL | 3 refills | Status: DC

## 2016-10-27 MED ORDER — SPIRONOLACTONE 25 MG PO TABS: 12.5000 mg | ORAL_TABLET | Freq: Every day | ORAL | 3 refills | Status: DC

## 2016-10-27 MED ORDER — BENAZEPRIL HCL 40 MG PO TABS: 20.0000 mg | ORAL_TABLET | Freq: Every day | ORAL | Status: DC

## 2016-10-27 NOTE — Progress Notes (Signed)
Patient Care Team: Patient, No Pcp Per as PCP - General (General Practice)   HPI  Mike Cole is a 66 y.o. male seen in followup for ventricular tachycardia/fibrillation presented as an out of hospital cardiac arrest. He  underwent intervention of an occluded LAD but had persistent left ventricular dysfunction prompting dual chamber ICD implantation St Jude/  Generator replacement 2/18.  The patient complains of shortness of breath and some dizziness which followed his generator replacement. There has been no edema or chest pain. There've been some shortness of breath at rest..  There has been no syncope or presyncope.    Echocardiogram 5/10 EF 30-35% DATE TEST    5/10    Echo   EF 30-35 %   11/17    Echo   EF 35-40 %         9/17 LDL  62  Date Cr K  1/18  1.18 4.6           Past Medical History:  Diagnosis Date  . Acute anterior myocardial infarction (HCC)    BMS to the LAD  . Cardiogenic shock ([place])    resolved  . Dyslipidemia   . History of transient ischemic attack   . Ischemic cardiomyopathy    EF of 20-25%  . Ventricular tachycardia ([place])    arrest, resuscitated    Past Surgical History:  Procedure Laterality Date  . EP IMPLANTABLE DEVICE N/A 07/07/2016   Procedure: ICD Generator Changeout;  Surgeon: Deboraha Sprang, MD;  Location: [place] [facility];  Service: Cardiovascular;  Laterality: N/A;  . St Jude CURRENT + DR  10/30/2008    Current Outpatient Prescriptions  Medication Sig Dispense Refill  . aspirin EC 81 MG tablet Take 81 mg by mouth daily.    [name] atorvastatin (LIPITOR) 40 MG tablet Take 1 tablet (40 mg total) by mouth daily at 6 PM. 30 tablet 11  . benazepril (LOTENSIN) 40 MG tablet TAKE 1 TABLET EVERY DAY 90 tablet 1  . carvedilol (COREG) 12.5 MG tablet TAKE 1 TABLET BY MOUTH TWICE A DAY 180 tablet 1  . furosemide (LASIX) 20 MG tablet TAKE 1 TABLET EVERY DAY 90 tablet 1  . Glycerin-Hypromellose-PEG 400 ([facility] DRY EYE RELIEF) 0.2-0.2-1 % SOLN Place  1-2 drops into both eyes 3 (three) times daily as needed (for dry eyes).    . Lecith-Inosi-Chol-B12-Liver (LIVERITE) 1.125 MCG TABS Take 1.125 mcg by mouth 2 (two) times daily.    . potassium chloride (MICRO-K) 10 MEQ CR capsule TAKE 2 CAPSULES BY MOUTH EVERY DAY 180 capsule 3   No current facility-administered medications for this visit.     No Known Allergies  Review of Systems negative except from HPI and PMH  Physical Exam BP 98/60   Pulse 60   Ht 6\' 1"  (1.854 m)   Wt 214 lb (97.1 kg)   SpO2 98%   BMI 28.23 kg/m  Well developed and nourished in no acute distress HENT normal Neck supple with JVP-flat Carotids brisk and full without bruits Clear Device pocket well healed; without hematoma or erythema.  There is no tethering  Regular rate and rhythm, no murmurs or gallops Abd-soft with active BS without hepatomegaly No Clubbing cyanosis edema Skin-warm and dry A & Oriented  Grossly normal sensory and motor function   ECG demonstrates sinus at 60 with intermittent atrial failure to capture and intermittent ventricular pacing   Assessment and  Plan  Ischemic cardiomyopathy  Aborted Cardiac Arrest   Implantable  defibrillator-St. Jude The patient's device was interrogated.  The information was reviewed. No changes were made in the programming.    Hyperlipidemia  Atrial lead failure  With Cardiomyopathy, he is candidate for change to entresto or addition of aldactone  With greater incremental benefit with latter will start.  Side effects reviewed.   With his modest hypotension and his modest bradycardia we will decrease his carvedilol 12.5--3.125 and Lotensin 40--20.    We have reprogrammed his device from DDD--VVI so as to avoid ventricular pacing given atrial failure to capture. There is some degree of sinus bradycardia hence the above. We will have him checked in about 2 weeks at which time we would be looking for ventricular pacing burden which today is  63%.  Metabolic profile at that time we will also check his fasting lipid panel.  More than 50% of 40 min was spent in counseling related to the above   Without symptoms of ischemia  Device is approaching ERI. We will obtain an echocardiogram to look at LV function.

## 2016-10-27 NOTE — Patient Instructions (Addendum)
Medication Instructions: - Your physician has recommended you make the following change in your medication:  1) Decrease lotensin (benazepril) to 40 mg- take 1/2 tablet (20 mg) by mouth once daily 2) Decrease coreg (carvedilol) to 3.125 mg- take 1 tablet by mouth twice daily 3) Start aldactone (spironolactone) 25 mg- take 1/2 tablet by mouth once daily  Labwork: - Your physician recommends that you return for FASTING lab work: the day you see Museum/gallery conservator, NP- BMP/Lipid  Procedures/Testing: - none ordered  Follow-Up: - Your physician recommends that you schedule a follow-up appointment in: 2-3 weeks with Chanetta Marshall, NP for Dr. Caryl Comes.   Any Additional Special Instructions Will Be Listed Below (If Applicable).     If you need a refill on your cardiac medications before your next appointment, please call your pharmacy.

## 2016-11-07 ENCOUNTER — Encounter: Payer: Self-pay | Admitting: Physician Assistant

## 2016-11-09 NOTE — Progress Notes (Signed)
Cardiology Office Note Date:  11/10/2016  Patient ID:  Mike Cole, Mike Cole 06, 1952, MRN [id number] PCP:  Patient, No Pcp Per  Cardiologist: Dr. Irish Lack Electrophysiologist: Dr. Caryl Comes   Chief Complaint: planned f/u  History of Present Illness: Mike Cole is a 66 y.o. male with history of cardiac arrest w.ICD, ICM, CAD, TIA.  He comes today to be seen for Dr. Caryl Comes last seen by him 10/27/16, the pt c/o SOB and he was found to be somewhat bradycardic (sinus) noting RA lead failure to capture and device programmed from DDD >> VVI. To avoid RV pacing, his carvediolol was decreased to 3.125mg  BID, and his BP a little low his ARB also decreased.  He was started on aldactone for his CM.  He was planned to f/u today to evaluate %VP (last was 63%) and f/u his BMET and lipids as well.  He is feeling much better, back to "normal".  He comes today accompanied by his wife, she mentions she can tell a clear improvement as well.  The patient denies any kind of CP, his SOB has resolved, no palpitations, no dizziness, near syncope or syncope.  Device information: SJM dual chamber ICD, implanted 10/29/08, generator change 07/07/16, Dr. Caryl Comes RA lead failure, programmed VVI 40bpm   Past Medical History:  Diagnosis Date  . Acute anterior myocardial infarction (HCC)    BMS to the LAD  . Cardiogenic shock ([place])    resolved  . Dyslipidemia   . History of transient ischemic attack   . Ischemic cardiomyopathy    EF of 20-25%  . Ventricular tachycardia ([place])    arrest, resuscitated    Past Surgical History:  Procedure Laterality Date  . EP IMPLANTABLE DEVICE N/A 07/07/2016   Procedure: ICD Generator Changeout;  Surgeon: Deboraha Sprang, MD;  Location: [facility];  Service: Cardiovascular;  Laterality: N/A;  . St Jude CURRENT + DR  10/30/2008    Current Outpatient Prescriptions  Medication Sig Dispense Refill  . aspirin EC 81 MG tablet Take 81 mg by mouth daily.    [name] atorvastatin (LIPITOR)  40 MG tablet Take 1 tablet (40 mg total) by mouth daily at 6 PM. 30 tablet 11  . benazepril (LOTENSIN) 40 MG tablet Take 0.5 tablets (20 mg total) by mouth daily.    . carvedilol (COREG) 3.125 MG tablet Take 1 tablet (3.125 mg total) by mouth 2 (two) times daily with a meal. 180 tablet 3  . furosemide (LASIX) 20 MG tablet Take 20 mg by mouth daily.  1  . Lecith-Inosi-Chol-B12-Liver (LIVERITE) 1.125 MCG TABS Take 1.125 mcg by mouth 2 (two) times daily.    . potassium chloride (MICRO-K) 10 MEQ CR capsule TAKE 2 CAPSULES BY MOUTH EVERY DAY 180 capsule 3  . spironolactone (ALDACTONE) 25 MG tablet Take 0.5 tablets (12.5 mg total) by mouth daily. 45 tablet 3   No current facility-administered medications for this visit.     Allergies:   Patient has no known allergies.   Social History:  The patient  reports that he quit smoking about 8 years ago. His smoking use included Cigarettes. He has never used smokeless tobacco. He reports that he drinks alcohol. He reports that he does not use drugs.   Family History:  The patient's family history includes CAD in his brother, brother, father, mother, and sister; Diabetes in his father; Heart disease in his brother.  ROS:  Please see the history of present illness.  All other systems are  reviewed and otherwise negative.   PHYSICAL EXAM:  VS:  BP 120/74 (BP Location: Left Arm)   Pulse 69   Ht 6\' 2"  (1.88 m)   Wt 210 lb 12.8 oz (95.6 kg)   BMI 27.07 kg/m  BMI: Body mass index is 27.07 kg/m. Well nourished, well developed, in no acute distress  HEENT: normocephalic, atraumatic  Neck: no JVD, carotid bruits or masses Cardiac:  RRR; no significant murmurs, no rubs, or gallops Lungs:  CTA b/l, no wheezing, rhonchi or rales  Abd: soft, nontender MS: no deformity or atrophy Ext: no edema  Skin: warm and dry, no rash Neuro:  No gross deficits appreciated Psych: euthymic mood, full affect  ICD site is stable, no tethering or discomfort, well healed  without evidence of infectioni   PPM interrogation done today and reviewed by myself: battery and lead measurements are good, no device observations, <1% V paced   04/10/16: TTE Study Conclusions - Left ventricle: The cavity size was mildly dilated. Systolic   function was moderately reduced. The estimated ejection fraction   was in the range of 35% to 40%. Akinesis and scarring of the   mid-apicalanteroseptal, anterior, and apical myocardium;   consistent with infarction in the distribution of the left   anterior descending coronary artery. Doppler parameters are   consistent with abnormal left ventricular relaxation (grade 1   diastolic dysfunction). - Aortic valve: There was trivial regurgitation. - Mitral valve: Systolic bowing without prolapse. - Left atrium: The atrium was mildly dilated. - Right atrium: The atrium was mildly dilated. - Tricuspid valve: There was mild-moderate regurgitation directed   centrally.  Recent Labs: 02/14/2016: ALT 18 06/26/2016: BUN 15; Creatinine, Ser 1.18; Hemoglobin 13.2; Platelets 151; Potassium 4.6; Sodium 144  02/14/2016: Cholesterol 127; HDL 52; LDL Cholesterol 62; Total CHOL/HDL Ratio 2.4; Triglycerides 63; VLDL 13   CrCl cannot be calculated (Patient's most recent lab result is older than the maximum 21 days allowed.).   Wt Readings from Last 3 Encounters:  11/10/16 210 lb 12.8 oz (95.6 kg)  10/27/16 214 lb (97.1 kg)  07/07/16 212 lb (96.2 kg)     Other studies reviewed: Additional studies/records reviewed today include: summarized above  ASSESSMENT AND PLAN:  1. ICD    Known RA lead failure, programmed VVI     2. Hx of cardiac arrest     No V arhythmias on device  3. CAD    BMS in 2010 to LAD, no other significant CAD by cath at that time    No anginal complaints    On ASA, BB, statin     4. ICM     No symptoms or exam findings of fluid OL, weight is stable (down)     On BB/ACE, diuretic   Disposition: BMET today, he  sees Dr. Irish Lack soon, will get remote device check in 3 months, Dr. Caryl Comes in 1 year, sooner if needed   I will ask the patient be called to come in fasting for lipid panel (not done today)  Current medicines are reviewed at length with the patient today.  The patient did not have any concerns regarding medicines.  Haywood Lasso, PA-C 11/10/2016 12:24 PM     [address] [place] [address] [phone] (office)  [phone] (fax)

## 2016-11-10 ENCOUNTER — Other Ambulatory Visit: Payer: PPO | Admitting: *Deleted

## 2016-11-10 ENCOUNTER — Encounter (INDEPENDENT_AMBULATORY_CARE_PROVIDER_SITE_OTHER): Payer: Self-pay

## 2016-11-10 ENCOUNTER — Encounter: Payer: Self-pay | Admitting: Physician Assistant

## 2016-11-10 ENCOUNTER — Ambulatory Visit (INDEPENDENT_AMBULATORY_CARE_PROVIDER_SITE_OTHER): Payer: PPO | Admitting: Physician Assistant

## 2016-11-10 VITALS — BP 120/74 | HR 69 | Ht 74.0 in | Wt 210.8 lb

## 2016-11-10 DIAGNOSIS — I4901 Ventricular fibrillation: Secondary | ICD-10-CM | POA: Diagnosis not present

## 2016-11-10 DIAGNOSIS — I255 Ischemic cardiomyopathy: Secondary | ICD-10-CM | POA: Diagnosis not present

## 2016-11-10 DIAGNOSIS — Z79899 Other long term (current) drug therapy: Secondary | ICD-10-CM | POA: Diagnosis not present

## 2016-11-10 DIAGNOSIS — I2510 Atherosclerotic heart disease of native coronary artery without angina pectoris: Secondary | ICD-10-CM

## 2016-11-10 DIAGNOSIS — Z95810 Presence of automatic (implantable) cardiac defibrillator: Secondary | ICD-10-CM

## 2016-11-10 DIAGNOSIS — I472 Ventricular tachycardia, unspecified: Secondary | ICD-10-CM

## 2016-11-10 LAB — LIPID PANEL
CHOL/HDL RATIO: 2.4 ratio (ref 0.0–5.0)
Cholesterol, Total: 120 mg/dL (ref 100–199)
HDL: 51 mg/dL (ref >39)
LDL CALC: 53 mg/dL (ref 0–99)
TRIGLYCERIDES: 79 mg/dL (ref 0–149)
VLDL Cholesterol Cal: 16 mg/dL (ref 5–40)

## 2016-11-10 LAB — BASIC METABOLIC PANEL
BUN/Creatinine Ratio: 13 (ref 10–24)
BUN: 14 mg/dL (ref 8–27)
CALCIUM: 9.3 mg/dL (ref 8.6–10.2)
CHLORIDE: 102 mmol/L (ref 96–106)
CO2: 22 mmol/L (ref 18–29)
Creatinine, Ser: 1.12 mg/dL (ref 0.76–1.27)
GFR calc Af Amer: 79 mL/min/{1.73_m2} (ref >59)
GFR calc non Af Amer: 69 mL/min/{1.73_m2} (ref >59)
Glucose: 104 mg/dL — ABNORMAL HIGH (ref 65–99)
POTASSIUM: 5.3 mmol/L — AB (ref 3.5–5.2)
Sodium: 140 mmol/L (ref 134–144)

## 2016-11-10 NOTE — Patient Instructions (Signed)
Medication Instructions:  Your physician recommends that you continue on your current medications as directed. Please refer to the Current Medication list given to you today.   Labwork: Your physician recommends that you return for lab work today for BMET  Testing/Procedures: None Ordered   Follow-Up: Your physician wants you to follow-up in: 1 year with Dr. Caryl Comes.  You will receive a reminder letter in the mail two months in advance. If you don't receive a letter, please call our office to schedule the follow-up appointment.  Remote monitoring is used to monitor your Pacemaker of ICD from home. This monitoring reduces the number of office visits required to check your device to one time per year. It allows [place] to keep an eye on the functioning of your device to ensure it is working properly. You are scheduled for a device check from home on 02/12/17.Y You may send your transmission at any time that day. If you have a wireless device, the transmission will be sent automatically. After your physician reviews your transmission, you will receive a postcard with your next transmission date.    Any Other Special Instructions Will Be Listed Below (If Applicable).     If you need a refill on your cardiac medications before your next appointment, please call your pharmacy.  Thank you for choosing [facility]

## 2016-11-14 ENCOUNTER — Telehealth: Payer: Self-pay | Admitting: Internal Medicine

## 2016-11-14 ENCOUNTER — Other Ambulatory Visit: Payer: Self-pay | Admitting: *Deleted

## 2016-11-14 DIAGNOSIS — E875 Hyperkalemia: Secondary | ICD-10-CM

## 2016-11-14 MED ORDER — POTASSIUM CHLORIDE ER 10 MEQ PO CPCR: 10.0000 meq | ORAL_CAPSULE | Freq: Every day | ORAL | Status: DC

## 2016-11-14 NOTE — Telephone Encounter (Signed)
Patient returning your call, thanks. °

## 2016-11-14 NOTE — Telephone Encounter (Signed)
I spoke with the patient regarding his lab results. 

## 2016-11-27 ENCOUNTER — Other Ambulatory Visit: Payer: PPO | Admitting: *Deleted

## 2016-11-27 DIAGNOSIS — E875 Hyperkalemia: Secondary | ICD-10-CM | POA: Diagnosis not present

## 2016-11-27 LAB — BASIC METABOLIC PANEL
BUN / CREAT RATIO: 12 (ref 10–24)
BUN: 15 mg/dL (ref 8–27)
CO2: 22 mmol/L (ref 20–29)
Calcium: 8.7 mg/dL (ref 8.6–10.2)
Chloride: 107 mmol/L — ABNORMAL HIGH (ref 96–106)
Creatinine, Ser: 1.23 mg/dL (ref 0.76–1.27)
GFR calc Af Amer: 71 mL/min/{1.73_m2} (ref >59)
GFR, EST NON AFRICAN AMERICAN: 61 mL/min/{1.73_m2} (ref >59)
Glucose: 99 mg/dL (ref 65–99)
POTASSIUM: 4.4 mmol/L (ref 3.5–5.2)
SODIUM: 143 mmol/L (ref 134–144)

## 2016-12-21 ENCOUNTER — Telehealth: Payer: Self-pay | Admitting: Internal Medicine

## 2016-12-21 NOTE — Telephone Encounter (Signed)
Transmission received, 0%VP, no episodes noted. Encouraged pt to alternate Tylenol and Ibprofuen to ease HA. Informed pt that I would route this to Dr.Klein and his nurse. Pt voiced understanding.

## 2016-12-21 NOTE — Telephone Encounter (Signed)
Spoke with pt regarding HA, pt stated that it really started on 5/25 visit with Dr.Klein, but did not think much about it, pt states that the HA is all day everyday, pt has been taking his medications as prescribed and has been keeping track of his BP, BP ranges between 973-532 systolic and 99'M diastolic and HR has been in the 60's. Requested pt to send in a manual transmission, pt having issues with getting transmission to send, number given to tech services for further troubleshooting.

## 2016-12-21 NOTE — Telephone Encounter (Addendum)
New message      Pt recentley had his device adjusted.  Since the adjustment, he has been having headaches.  Calling to see if it could be from the device adjustment?  Pt want device adjusted again

## 2016-12-27 NOTE — Telephone Encounter (Signed)
Should call PCP re head ache eval  Thanks

## 2016-12-28 NOTE — Telephone Encounter (Signed)
I left a message on the patient's identified voice mail regarding Dr. Olin Pia recommendations about his headaches.

## 2017-01-10 ENCOUNTER — Other Ambulatory Visit: Payer: Self-pay | Admitting: Interventional Cardiology

## 2017-01-11 ENCOUNTER — Other Ambulatory Visit: Payer: Self-pay | Admitting: Interventional Cardiology

## 2017-02-12 ENCOUNTER — Ambulatory Visit (INDEPENDENT_AMBULATORY_CARE_PROVIDER_SITE_OTHER): Payer: PPO | Admitting: *Deleted

## 2017-02-12 ENCOUNTER — Telehealth: Payer: Self-pay | Admitting: Cardiology

## 2017-02-12 DIAGNOSIS — I255 Ischemic cardiomyopathy: Secondary | ICD-10-CM | POA: Diagnosis not present

## 2017-02-12 NOTE — Telephone Encounter (Signed)
Spoke with pt and reminded pt of remote transmission that is due today. Pt verbalized understanding.   

## 2017-02-12 NOTE — Progress Notes (Signed)
Remote ICD transmission.   

## 2017-02-14 ENCOUNTER — Encounter: Payer: Self-pay | Admitting: Cardiology

## 2017-02-15 LAB — CUP PACEART REMOTE DEVICE CHECK
Date Time Interrogation Session: 20180913100224
Implantable Lead Implant Date: 20100527
Implantable Lead Location: 753859
Lead Channel Setting Pacing Amplitude: 1.375
Lead Channel Setting Pacing Pulse Width: 0.5 ms
MDC IDC LEAD IMPLANT DT: 20100527
MDC IDC LEAD LOCATION: 753860
MDC IDC PG IMPLANT DT: 20180202
MDC IDC SET LEADCHNL RA PACING AMPLITUDE: 2 V
MDC IDC SET LEADCHNL RV SENSING SENSITIVITY: 0.5 mV
Pulse Gen Serial Number: 7402114

## 2017-02-19 NOTE — Progress Notes (Signed)
Cardiology Office Note   Date:  02/20/2017   ID:  ZYHEIR DAFT, DOB 1951-04-09, MRN [id number]  PCP:  Patient, No Pcp Per    No chief complaint on file. f/u CAD   Wt Readings from Last 3 Encounters:  02/20/17 214 lb (97.1 kg)  11/10/16 210 lb 12.8 oz (95.6 kg)  10/27/16 214 lb (97.1 kg)       History of Present Illness: Mike Cole is a 66 y.o. male  who had an anterior MI  In March 7408 complicated by cardiac arrest and subsequent ischemic cardiomyopathy. He had an AICD placed.  Last EF was 35-40% in 11/17.    He had a generator changeout and felt poorly after this.  It was difficult to walk.  He had his meds adjusted and he felt better, after Dr. Olin Pia visit.  All of his sx have resolved since then.  Denies : Chest pain. Dizziness. Leg edema. Nitroglycerin use. Orthopnea. Palpitations. Paroxysmal nocturnal dyspnea. Shortness of breath. Syncope.   BP not checked outside of the MDs office.    He admits to dietary indiscretion.  Appetite has increased since the problems above resolved. No bleeding problems.   His brother passed away form cardiac arrest In 2018. He was also on the [employer] protocol..      Past Medical History:  Diagnosis Date  . Acute anterior myocardial infarction (HCC)    BMS to the LAD  . Cardiogenic shock ([place])    resolved  . Dyslipidemia   . History of transient ischemic attack   . Ischemic cardiomyopathy    EF of 20-25%  . Ventricular tachycardia ([place])    arrest, resuscitated    Past Surgical History:  Procedure Laterality Date  . EP IMPLANTABLE DEVICE N/A 07/07/2016   Procedure: ICD Generator Changeout;  Surgeon: Deboraha Sprang, MD;  Location: [facility];  Service: Cardiovascular;  Laterality: N/A;  . St Jude CURRENT + DR  10/30/2008     Current Outpatient Prescriptions  Medication Sig Dispense Refill  . aspirin EC 81 MG tablet Take 81 mg by mouth daily.    [name] atorvastatin (LIPITOR) 40 MG tablet TAKE 1 TABLET BY  MOUTH EVERY DAY AT 6PM 30 tablet 9  . benazepril (LOTENSIN) 40 MG tablet TAKE 1 TABLET EVERY DAY 90 tablet 3  . carvedilol (COREG) 3.125 MG tablet Take 1 tablet (3.125 mg total) by mouth 2 (two) times daily with a meal. 180 tablet 3  . furosemide (LASIX) 20 MG tablet TAKE 1 TABLET EVERY DAY 90 tablet 3  . Lecith-Inosi-Chol-B12-Liver (LIVERITE) 1.125 MCG TABS Take 1.125 mcg by mouth 2 (two) times daily.    . potassium chloride (MICRO-K) 10 MEQ CR capsule Take 1 capsule (10 mEq total) by mouth daily.    [name] spironolactone (ALDACTONE) 25 MG tablet Take 0.5 tablets (12.5 mg total) by mouth daily. 45 tablet 3   No current facility-administered medications for this visit.     Allergies:   Patient has no known allergies.    Social History:  The patient  reports that he quit smoking about 8 years ago. His smoking use included Cigarettes. He has never used smokeless tobacco. He reports that he drinks alcohol. He reports that he does not use drugs.   Family History:  The patient's family history includes CAD in his brother, brother, father, mother, and sister; Diabetes in his father; Heart disease in his brother.    ROS:  Please see the history  of present illness.   Otherwise, review of systems are positive for joint pains in hands.   All other systems are reviewed and negative.    PHYSICAL EXAM: VS:  BP (!) 122/58   Pulse 67   Ht 6\' 2"  (1.88 m)   Wt 214 lb (97.1 kg)   BMI 27.48 kg/m  , BMI Body mass index is 27.48 kg/m. GEN: Well nourished, well developed, in no acute distress  HEENT: normal  Neck: no JVD, carotid bruits, or masses Cardiac: RRR; no murmurs, rubs, or gallops,no edema  Respiratory:  clear to auscultation bilaterally, normal work of breathing GI: soft, nontender, nondistended, + BS MS: no deformity or atrophy  Skin: warm and dry, no rash Neuro:  Strength and sensation are intact Psych: euthymic mood, full affect   EKG:   The ekg ordered in May 2018 demonstrates paced  rhythm   Recent Labs: 06/26/2016: Hemoglobin 13.2; Platelets 151 11/27/2016: BUN 15; Creatinine, Ser 1.23; Potassium 4.4; Sodium 143   Lipid Panel    Component Value Date/Time   CHOL 120 11/10/2016 1008   TRIG 79 11/10/2016 1008   HDL 51 11/10/2016 1008   CHOLHDL 2.4 11/10/2016 1008   CHOLHDL 2.4 02/14/2016 0922   VLDL 13 02/14/2016 0922   LDLCALC 53 11/10/2016 1008     Other studies Reviewed: Additional studies/ records that were reviewed today with results demonstrating: EF 35-40% in 2017, November .   ASSESSMENT AND PLAN:  1. CAD/Old MI: No angina on medical therapy.  Continue aggressive secondary prevention. 2. Hyperlipidemia: Continue atorvastatin. Lipids well controlled in June 2018.  Recheck lipids in June 2019. 3. Hypertension: Continue antihypertensives. Blood pressure well controlled.   4. Chronic systolic heart failure: Appears euvolemic. OK to hold Lasix and potassium 2 days a week for his convenience.  He will let us know if he retains fluid. He will continue aldactone. 5. He needs to find a primary care physician. A list of primary care physicians in the community was given today. We talked about the need for screening for colon cancer, prostate cancer as well as recommendations for pneumonia shot, flu shot among others.   Current medicines are reviewed at length with the patient today.  The patient concerns regarding his medicines were addressed.  The following changes have been made:  Decrease Lasix and potassium to 5 days a week for patient convenience  Labs/ tests ordered today include:  No orders of the defined types were placed in this encounter.   Recommend 150 minutes/week of aerobic exercise Low fat, low carb, high fiber diet recommended  Disposition:   FU in 1 year   Signed, Larae Grooms, MD  02/20/2017 8:04 AM    [facility] [address], [place], [place]  [address] Phone: [phone]; Fax: [phone]

## 2017-02-20 ENCOUNTER — Ambulatory Visit (INDEPENDENT_AMBULATORY_CARE_PROVIDER_SITE_OTHER): Payer: PPO | Admitting: Interventional Cardiology

## 2017-02-20 ENCOUNTER — Encounter (INDEPENDENT_AMBULATORY_CARE_PROVIDER_SITE_OTHER): Payer: Self-pay

## 2017-02-20 ENCOUNTER — Encounter: Payer: Self-pay | Admitting: Interventional Cardiology

## 2017-02-20 VITALS — BP 122/58 | HR 67 | Ht 74.0 in | Wt 214.0 lb

## 2017-02-20 DIAGNOSIS — I5022 Chronic systolic (congestive) heart failure: Secondary | ICD-10-CM

## 2017-02-20 DIAGNOSIS — I10 Essential (primary) hypertension: Secondary | ICD-10-CM | POA: Diagnosis not present

## 2017-02-20 DIAGNOSIS — E785 Hyperlipidemia, unspecified: Secondary | ICD-10-CM

## 2017-02-20 DIAGNOSIS — I25119 Atherosclerotic heart disease of native coronary artery with unspecified angina pectoris: Secondary | ICD-10-CM

## 2017-02-20 NOTE — Patient Instructions (Addendum)
Medication Instructions:  Your physician recommends that you continue on your current medications as directed. Please refer to the Current Medication list given to you today.  You may skip your lasix and potassium a couple of days a week.  Labwork: Your physician recommends that you return for lab work in: June 2019 for CMET and LIPIDs   Testing/Procedures: None ordered  Follow-Up: Your physician wants you to follow-up in: 1 year with Dr. Irish Lack. You will receive a reminder letter in the mail two months in advance. If you don't receive a letter, please call our office to schedule the follow-up appointment.   Any Other Special Instructions Will Be Listed Below (If Applicable).  A list of primary care doctors and [facility] number provided.   If you need a refill on your cardiac medications before your next appointment, please call your pharmacy.

## 2017-04-09 ENCOUNTER — Other Ambulatory Visit: Payer: Self-pay | Admitting: Interventional Cardiology

## 2017-05-14 ENCOUNTER — Ambulatory Visit (INDEPENDENT_AMBULATORY_CARE_PROVIDER_SITE_OTHER): Payer: PPO | Admitting: *Deleted

## 2017-05-14 DIAGNOSIS — I255 Ischemic cardiomyopathy: Secondary | ICD-10-CM | POA: Diagnosis not present

## 2017-05-15 NOTE — Progress Notes (Signed)
Remote ICD transmission.   

## 2017-05-18 ENCOUNTER — Encounter: Payer: Self-pay | Admitting: Cardiology

## 2017-06-07 LAB — CUP PACEART REMOTE DEVICE CHECK
Implantable Lead Implant Date: 20100527
Implantable Lead Implant Date: 20100527
Implantable Lead Location: 753860
Implantable Pulse Generator Implant Date: 20180202
MDC IDC LEAD LOCATION: 753859
MDC IDC SESS DTM: 20190103122544
Pulse Gen Serial Number: 7402114

## 2017-07-09 DIAGNOSIS — H524 Presbyopia: Secondary | ICD-10-CM | POA: Diagnosis not present

## 2017-07-09 DIAGNOSIS — H2513 Age-related nuclear cataract, bilateral: Secondary | ICD-10-CM | POA: Diagnosis not present

## 2017-07-09 DIAGNOSIS — H5203 Hypermetropia, bilateral: Secondary | ICD-10-CM | POA: Diagnosis not present

## 2017-07-09 DIAGNOSIS — H52223 Regular astigmatism, bilateral: Secondary | ICD-10-CM | POA: Diagnosis not present

## 2017-07-31 DIAGNOSIS — L718 Other rosacea: Secondary | ICD-10-CM | POA: Diagnosis not present

## 2017-08-13 ENCOUNTER — Ambulatory Visit (INDEPENDENT_AMBULATORY_CARE_PROVIDER_SITE_OTHER): Payer: PPO | Admitting: *Deleted

## 2017-08-13 ENCOUNTER — Telehealth: Payer: Self-pay | Admitting: Cardiology

## 2017-08-13 DIAGNOSIS — I255 Ischemic cardiomyopathy: Secondary | ICD-10-CM | POA: Diagnosis not present

## 2017-08-13 NOTE — Telephone Encounter (Signed)
Attempted to confirm remote transmission with pt. No answer and was unable to leave a message.   

## 2017-08-14 NOTE — Progress Notes (Signed)
Remote ICD transmission.   

## 2017-08-15 ENCOUNTER — Encounter: Payer: Self-pay | Admitting: Cardiology

## 2017-09-01 LAB — CUP PACEART REMOTE DEVICE CHECK
Date Time Interrogation Session: 20190330221027
Implantable Lead Implant Date: 20100527
Implantable Lead Location: 753859
Implantable Lead Location: 753860
MDC IDC LEAD IMPLANT DT: 20100527
MDC IDC PG IMPLANT DT: 20180202
Pulse Gen Serial Number: 7402114

## 2017-09-15 ENCOUNTER — Other Ambulatory Visit: Payer: Self-pay | Admitting: Interventional Cardiology

## 2017-10-15 ENCOUNTER — Other Ambulatory Visit: Payer: Self-pay | Admitting: Internal Medicine

## 2017-11-02 ENCOUNTER — Other Ambulatory Visit: Payer: Self-pay | Admitting: Internal Medicine

## 2017-11-12 ENCOUNTER — Telehealth: Payer: Self-pay

## 2017-11-12 ENCOUNTER — Ambulatory Visit (INDEPENDENT_AMBULATORY_CARE_PROVIDER_SITE_OTHER): Payer: PPO | Admitting: *Deleted

## 2017-11-12 DIAGNOSIS — I255 Ischemic cardiomyopathy: Secondary | ICD-10-CM | POA: Diagnosis not present

## 2017-11-12 NOTE — Telephone Encounter (Signed)
LMOVM reminding pt to send remote transmission.   

## 2017-11-13 ENCOUNTER — Telehealth: Payer: Self-pay | Admitting: *Deleted

## 2017-11-13 ENCOUNTER — Encounter: Payer: Self-pay | Admitting: Cardiology

## 2017-11-13 ENCOUNTER — Encounter: Payer: Self-pay | Admitting: Internal Medicine

## 2017-11-13 ENCOUNTER — Ambulatory Visit: Payer: PPO | Admitting: Internal Medicine

## 2017-11-13 VITALS — BP 126/70 | HR 56 | Ht 74.0 in | Wt 212.0 lb

## 2017-11-13 DIAGNOSIS — I5022 Chronic systolic (congestive) heart failure: Secondary | ICD-10-CM | POA: Diagnosis not present

## 2017-11-13 DIAGNOSIS — Z95810 Presence of automatic (implantable) cardiac defibrillator: Secondary | ICD-10-CM | POA: Diagnosis not present

## 2017-11-13 DIAGNOSIS — I255 Ischemic cardiomyopathy: Secondary | ICD-10-CM

## 2017-11-13 DIAGNOSIS — I4901 Ventricular fibrillation: Secondary | ICD-10-CM | POA: Diagnosis not present

## 2017-11-13 LAB — CUP PACEART INCLINIC DEVICE CHECK
Battery Remaining Longevity: 91 mo
Brady Statistic RA Percent Paced: 0 %
Date Time Interrogation Session: 20190611171803
HIGH POWER IMPEDANCE MEASURED VALUE: 78 Ohm
Implantable Lead Implant Date: 20100527
Implantable Lead Implant Date: 20100527
Implantable Lead Location: 753859
Implantable Pulse Generator Implant Date: 20180202
Lead Channel Pacing Threshold Amplitude: 1.25 V
Lead Channel Pacing Threshold Pulse Width: 0.5 ms
Lead Channel Sensing Intrinsic Amplitude: 0.3 mV
Lead Channel Setting Pacing Pulse Width: 0.5 ms
Lead Channel Setting Sensing Sensitivity: 0.5 mV
MDC IDC LEAD LOCATION: 753860
MDC IDC MSMT LEADCHNL RA IMPEDANCE VALUE: 412.5 Ohm
MDC IDC MSMT LEADCHNL RV IMPEDANCE VALUE: 412.5 Ohm
MDC IDC MSMT LEADCHNL RV SENSING INTR AMPL: 11.7 mV
MDC IDC PG SERIAL: 7402114
MDC IDC SET LEADCHNL RV PACING AMPLITUDE: 2.5 V
MDC IDC STAT BRADY RV PERCENT PACED: 0 %

## 2017-11-13 NOTE — Patient Instructions (Addendum)
Medication Instructions:  Your physician recommends that you continue on your current medications as directed. Please refer to the Current Medication list given to you today.  Labwork: You will have labs drawn today: pt will return 6/12 for fasting lipids and cmet per Dr Scarlette Calico.  Testing/Procedures: Your physician has requested that you have an echocardiogram. Echocardiography is a painless test that uses sound waves to create images of your heart. It provides your doctor with information about the size and shape of your heart and how well your heart's chambers and valves are working. This procedure takes approximately one hour. There are no restrictions for this procedure.   Follow-Up: Your physician wants you to follow-up in: One Year with Dr Caryl Comes. You will receive a reminder letter in the mail two months in advance. If you don't receive a letter, please call our office to schedule the follow-up appointment.  Remote monitoring is used to monitor your ICD from home. This monitoring reduces the number of office visits required to check your device to one time per year. It allows [place] to keep an eye on the functioning of your device to ensure it is working properly. You are scheduled for a device check from home on 02/12/2018. You may send your transmission at any time that day. If you have a wireless device, the transmission will be sent automatically. After your physician reviews your transmission, you will receive a postcard with your next transmission date.     Any Other Special Instructions Will Be Listed Below (If Applicable).     If you need a refill on your cardiac medications before your next appointment, please call your pharmacy.

## 2017-11-13 NOTE — Telephone Encounter (Signed)
Spoke with patient to advise that his Merlin transmission was successfully received this afternoon.  He is aware and denies additional questions or concerns at this time.

## 2017-11-13 NOTE — Progress Notes (Signed)
Remote ICD transmission.   

## 2017-11-13 NOTE — Progress Notes (Signed)
Patient Care Team: Patient, No Pcp Per as PCP - General (General Practice)   HPI  Mike Cole is a 67 y.o. male seen in followup for ventricular tachycardia/fibrillation presented as an out of hospital cardiac arrest. He  underwent intervention of an occluded LAD but had persistent left ventricular dysfunction prompting dual chamber ICD implantation St Jude/  Generator replacement 2/18.  He has atrial lead failure prompting reprogramming of the device DDD-VVI lower rate of 40, this to avoid ventricular pacing  He has moderate shortness of breath with activity.  Denies chest pain or peripheral edema.  He has some just bad days.  He makes bad days better by eating Oreos.[name]     DATE TEST    5/10    Echo   EF 30-35 %   11/17    Echo   EF 35-40 %            Date Cr K LDL  1/18  1.18 4.6    6/18 1.23 4.4  53      Past Medical History:  Diagnosis Date  . Acute anterior myocardial infarction (HCC)    BMS to the LAD  . Cardiogenic shock ([place])    resolved  . Dyslipidemia   . History of transient ischemic attack   . Ischemic cardiomyopathy    EF of 20-25%  . Ventricular tachycardia ([place])    arrest, resuscitated    Past Surgical History:  Procedure Laterality Date  . EP IMPLANTABLE DEVICE N/A 07/07/2016   Procedure: ICD Generator Changeout;  Surgeon: Deboraha Sprang, MD;  Location: [facility];  Service: Cardiovascular;  Laterality: N/A;  . St Jude CURRENT + DR  10/30/2008    Current Outpatient Medications  Medication Sig Dispense Refill  . aspirin EC 81 MG tablet Take 81 mg by mouth daily.    [name] atorvastatin (LIPITOR) 40 MG tablet TAKE 1 TABLET BY MOUTH EVERY DAY AT 6PM 30 tablet 5  . benazepril (LOTENSIN) 40 MG tablet TAKE 1 TABLET EVERY DAY 90 tablet 3  . carvedilol (COREG) 3.125 MG tablet TAKE 1 TABLET BY MOUTH TWICE A DAY WITH A MEAL 180 tablet 1  . furosemide (LASIX) 20 MG tablet TAKE 1 TABLET EVERY DAY 90 tablet 3  . Lecith-Inosi-Chol-B12-Liver (LIVERITE) 1.125  MCG TABS Take 1.125 mcg by mouth 2 (two) times daily.    . potassium chloride (MICRO-K) 10 MEQ CR capsule Take 1 capsule (10 mEq total) by mouth daily.    [name] spironolactone (ALDACTONE) 25 MG tablet TAKE 1/2 TABLET BY MOUTH DAILY 45 tablet 0   No current facility-administered medications for this visit.     No Known Allergies  Review of Systems negative except from HPI and PMH  Physical Exam BP 126/70   Pulse (!) 56   Ht 6\' 2"  (1.88 m)   Wt 212 lb (96.2 kg)   SpO2 96%   BMI 27.22 kg/m  Well developed and nourished in no acute distress HENT normal Neck supple with JVP-flat Clear Device pocket well healed; without hematoma or erythema.  There is no tethering  Regular rate and rhythm, no murmurs or gallops Abd-soft with active BS No Clubbing cyanosis edema Skin-warm and dry A & Oriented  Grossly normal sensory and motor function    ECG sinus rhythm at 54.  Assessment and  Plan  Ischemic cardiomyopathy  Aborted Cardiac Arrest   Implantable defibrillator-St. Jude The patient's device was interrogated.  The information was reviewed. No changes were made  in the programming.    Hyperlipidemia  Atrial lead failure  Sinus node dysfunction    No intercurrent and ventricular arrhythmias.  Heart rate excursion is mildly blunted.  This may explain some of his exercise intolerance.  We need to check surveillance laboratories on Aldactone.  This should be checked at least every 6 months.  We will reassess LV function.  If it remains depressed, he would be a class I candidate for the switch to [facility].  Last LDL was at goal   We spent more than 50% of our >25 min visit in face to face counseling regarding the above

## 2017-11-13 NOTE — Progress Notes (Signed)
Letter  

## 2017-11-14 ENCOUNTER — Other Ambulatory Visit: Payer: PPO | Admitting: *Deleted

## 2017-11-14 DIAGNOSIS — E785 Hyperlipidemia, unspecified: Secondary | ICD-10-CM

## 2017-11-14 DIAGNOSIS — I25119 Atherosclerotic heart disease of native coronary artery with unspecified angina pectoris: Secondary | ICD-10-CM | POA: Diagnosis not present

## 2017-11-14 LAB — LIPID PANEL
Chol/HDL Ratio: 2.3 ratio (ref 0.0–5.0)
Cholesterol, Total: 110 mg/dL (ref 100–199)
HDL: 47 mg/dL (ref >39)
LDL CALC: 51 mg/dL (ref 0–99)
Triglycerides: 59 mg/dL (ref 0–149)
VLDL CHOLESTEROL CAL: 12 mg/dL (ref 5–40)

## 2017-11-20 ENCOUNTER — Other Ambulatory Visit: Payer: Self-pay

## 2017-11-20 ENCOUNTER — Ambulatory Visit (HOSPITAL_COMMUNITY): Payer: PPO | Attending: Cardiovascular Disease

## 2017-11-20 DIAGNOSIS — I2510 Atherosclerotic heart disease of native coronary artery without angina pectoris: Secondary | ICD-10-CM | POA: Insufficient documentation

## 2017-11-20 DIAGNOSIS — I509 Heart failure, unspecified: Secondary | ICD-10-CM | POA: Insufficient documentation

## 2017-11-20 DIAGNOSIS — I255 Ischemic cardiomyopathy: Secondary | ICD-10-CM | POA: Insufficient documentation

## 2017-11-20 DIAGNOSIS — Z95810 Presence of automatic (implantable) cardiac defibrillator: Secondary | ICD-10-CM

## 2017-11-20 DIAGNOSIS — I5022 Chronic systolic (congestive) heart failure: Secondary | ICD-10-CM | POA: Diagnosis not present

## 2017-11-20 DIAGNOSIS — I081 Rheumatic disorders of both mitral and tricuspid valves: Secondary | ICD-10-CM | POA: Insufficient documentation

## 2017-11-20 DIAGNOSIS — I4901 Ventricular fibrillation: Secondary | ICD-10-CM | POA: Diagnosis not present

## 2017-11-20 MED ORDER — PERFLUTREN LIPID MICROSPHERE
1.0000 mL | INTRAVENOUS | Status: AC | PRN
  Administered 2017-11-20: 2 mL via INTRAVENOUS

## 2017-11-28 ENCOUNTER — Telehealth: Payer: Self-pay | Admitting: *Deleted

## 2017-11-28 NOTE — Telephone Encounter (Signed)
Patient is agreeable to [facility] appointment on 12/05/17 at 11:00am for ICD reprogramming per Dr. Caryl Comes.  Karilyn Cota, St. Jude/[employer] rep, is aware and will be present.  Per Dr. Caryl Comes, reprogram SVT discrimination from dual chamber to single chamber (RV only--onset, stability, morphology) as RA lead has been disabled due to non-capture/low P-wave amplitude.

## 2017-12-04 ENCOUNTER — Telehealth: Payer: Self-pay

## 2017-12-04 LAB — CUP PACEART REMOTE DEVICE CHECK
Date Time Interrogation Session: 20190702090657
Implantable Lead Implant Date: 20100527
Implantable Lead Location: 753860
Implantable Pulse Generator Implant Date: 20180202
Lead Channel Setting Sensing Sensitivity: 0.5 mV
MDC IDC LEAD IMPLANT DT: 20100527
MDC IDC LEAD LOCATION: 753859
MDC IDC SET LEADCHNL RV PACING AMPLITUDE: 2.5 V
MDC IDC SET LEADCHNL RV PACING PULSEWIDTH: 0.5 ms
MDC IDC STAT BRADY RV PERCENT PACED: 0 %
Pulse Gen Serial Number: 7402114

## 2017-12-04 NOTE — Telephone Encounter (Signed)
-----   Message from Deboraha Sprang, MD sent at 11/26/2017 10:07 AM EDT ----- Regarding: RE: Dewaine Conger,  pts 35-40% EF  Raised the Question of entresto, it seems a good idea to me, what do you think?   ----- Message ----- From: Dollene Primrose, RN Sent: 11/23/2017  12:41 PM To: Deboraha Sprang, MD Subject: Delene Loll                                       Mr Demario was wondering if you wanted to start him on Entresto?

## 2017-12-04 NOTE — Telephone Encounter (Signed)
OK.  He may only be NYHA class I, in which case he does not qualify

## 2017-12-04 NOTE — Telephone Encounter (Signed)
Spoke with pt. He is weary about starting a new medication. He states he feels "really good right now and does not know if I want to start a new medication." Pt also states he is going on vacation next week and would like to wait until after he gets back to think about it. I told him this would be okay and he may contact me back when he is ready to start Entresto. He verbalized understanding and agreed to plan.

## 2017-12-05 ENCOUNTER — Ambulatory Visit (INDEPENDENT_AMBULATORY_CARE_PROVIDER_SITE_OTHER): Payer: PPO | Admitting: *Deleted

## 2017-12-05 DIAGNOSIS — Z95810 Presence of automatic (implantable) cardiac defibrillator: Secondary | ICD-10-CM

## 2017-12-05 DIAGNOSIS — I5022 Chronic systolic (congestive) heart failure: Secondary | ICD-10-CM

## 2017-12-05 DIAGNOSIS — I255 Ischemic cardiomyopathy: Secondary | ICD-10-CM

## 2017-12-05 LAB — CUP PACEART INCLINIC DEVICE CHECK
Brady Statistic RA Percent Paced: 0 %
Brady Statistic RV Percent Paced: 0 %
Date Time Interrogation Session: 20190703111426
HIGH POWER IMPEDANCE MEASURED VALUE: 75.375
Implantable Lead Implant Date: 20100527
Implantable Lead Location: 753860
Lead Channel Setting Pacing Amplitude: 2.5 V
Lead Channel Setting Pacing Pulse Width: 0.5 ms
Lead Channel Setting Sensing Sensitivity: 0.5 mV
MDC IDC LEAD IMPLANT DT: 20100527
MDC IDC LEAD LOCATION: 753859
MDC IDC MSMT BATTERY REMAINING LONGEVITY: 91 mo
MDC IDC MSMT LEADCHNL RA IMPEDANCE VALUE: 412.5 Ohm
MDC IDC MSMT LEADCHNL RA SENSING INTR AMPL: 0.3 mV
MDC IDC MSMT LEADCHNL RV IMPEDANCE VALUE: 400 Ohm
MDC IDC MSMT LEADCHNL RV PACING THRESHOLD AMPLITUDE: 1 V
MDC IDC MSMT LEADCHNL RV PACING THRESHOLD PULSEWIDTH: 0.5 ms
MDC IDC MSMT LEADCHNL RV SENSING INTR AMPL: 11.7 mV
MDC IDC PG IMPLANT DT: 20180202
Pulse Gen Serial Number: 7402114

## 2017-12-05 NOTE — Progress Notes (Signed)
Device clinic appointment for SVT discriminator reprogramming from dual chamber to RV only. See scanned report for auto-programming changes due to this programming. Merlin 02/11/18, [place] with SK in 11/2018.

## 2017-12-28 ENCOUNTER — Telehealth: Payer: Self-pay | Admitting: Internal Medicine

## 2017-12-28 NOTE — Telephone Encounter (Signed)
Pt calls today to discuss his possible start on Entresto. Per Dr Emily Filbert last message, he believed pt may not qualify based of NYHA class I. I will discuss with Dr Caryl Comes and call patient back next week.

## 2017-12-28 NOTE — Telephone Encounter (Signed)
Pt following on a change with his medications per his last visit. please give him call

## 2018-01-02 ENCOUNTER — Other Ambulatory Visit: Payer: Self-pay | Admitting: Interventional Cardiology

## 2018-01-14 ENCOUNTER — Other Ambulatory Visit: Payer: Self-pay | Admitting: Interventional Cardiology

## 2018-01-25 ENCOUNTER — Other Ambulatory Visit: Payer: Self-pay | Admitting: Internal Medicine

## 2018-02-11 ENCOUNTER — Telehealth: Payer: Self-pay

## 2018-02-11 ENCOUNTER — Ambulatory Visit (INDEPENDENT_AMBULATORY_CARE_PROVIDER_SITE_OTHER): Payer: PPO | Admitting: *Deleted

## 2018-02-11 DIAGNOSIS — I255 Ischemic cardiomyopathy: Secondary | ICD-10-CM | POA: Diagnosis not present

## 2018-02-11 NOTE — Telephone Encounter (Signed)
LMOVM reminding pt to send remote transmission.   

## 2018-02-12 ENCOUNTER — Encounter: Payer: Self-pay | Admitting: Cardiology

## 2018-02-12 NOTE — Progress Notes (Signed)
Remote ICD transmission.   

## 2018-03-05 LAB — CUP PACEART REMOTE DEVICE CHECK
Date Time Interrogation Session: 20191001120707
Implantable Lead Implant Date: 20100527
Implantable Lead Location: 753860
Implantable Pulse Generator Implant Date: 20180202
MDC IDC LEAD IMPLANT DT: 20100527
MDC IDC LEAD LOCATION: 753859
Pulse Gen Serial Number: 7402114

## 2018-03-22 ENCOUNTER — Other Ambulatory Visit: Payer: Self-pay | Admitting: Interventional Cardiology

## 2018-03-29 ENCOUNTER — Other Ambulatory Visit: Payer: Self-pay | Admitting: Interventional Cardiology

## 2018-04-12 ENCOUNTER — Other Ambulatory Visit: Payer: Self-pay | Admitting: Internal Medicine

## 2018-04-22 ENCOUNTER — Other Ambulatory Visit: Payer: PPO | Admitting: *Deleted

## 2018-04-22 ENCOUNTER — Other Ambulatory Visit: Payer: Self-pay

## 2018-04-22 DIAGNOSIS — E785 Hyperlipidemia, unspecified: Secondary | ICD-10-CM

## 2018-04-22 DIAGNOSIS — I10 Essential (primary) hypertension: Secondary | ICD-10-CM

## 2018-04-22 DIAGNOSIS — I5022 Chronic systolic (congestive) heart failure: Secondary | ICD-10-CM

## 2018-04-22 LAB — COMPREHENSIVE METABOLIC PANEL
A/G RATIO: 2 (ref 1.2–2.2)
ALBUMIN: 4.3 g/dL (ref 3.6–4.8)
ALT: 18 IU/L (ref 0–44)
AST: 15 IU/L (ref 0–40)
Alkaline Phosphatase: 121 IU/L — ABNORMAL HIGH (ref 39–117)
BILIRUBIN TOTAL: 0.8 mg/dL (ref 0.0–1.2)
BUN / CREAT RATIO: 11 (ref 10–24)
BUN: 13 mg/dL (ref 8–27)
CHLORIDE: 102 mmol/L (ref 96–106)
CO2: 24 mmol/L (ref 20–29)
Calcium: 9 mg/dL (ref 8.6–10.2)
Creatinine, Ser: 1.19 mg/dL (ref 0.76–1.27)
GFR calc non Af Amer: 63 mL/min/{1.73_m2} (ref >59)
GFR, EST AFRICAN AMERICAN: 73 mL/min/{1.73_m2} (ref >59)
GLUCOSE: 103 mg/dL — AB (ref 65–99)
Globulin, Total: 2.2 g/dL (ref 1.5–4.5)
POTASSIUM: 4.8 mmol/L (ref 3.5–5.2)
Sodium: 140 mmol/L (ref 134–144)
TOTAL PROTEIN: 6.5 g/dL (ref 6.0–8.5)

## 2018-04-22 LAB — LIPID PANEL
CHOLESTEROL TOTAL: 117 mg/dL (ref 100–199)
Chol/HDL Ratio: 2.3 ratio (ref 0.0–5.0)
HDL: 50 mg/dL (ref >39)
LDL Calculated: 55 mg/dL (ref 0–99)
Triglycerides: 62 mg/dL (ref 0–149)
VLDL Cholesterol Cal: 12 mg/dL (ref 5–40)

## 2018-04-22 NOTE — Progress Notes (Signed)
Cardiology Office Note   Date:  04/23/2018   ID:  Mike Cole, DOB [birth year], MRN [id number]  PCP:  Patient, No Pcp Per    No chief complaint on file.  CAD/anterior MI  Wt Readings from Last 3 Encounters:  04/23/18 209 lb 12.8 oz (95.2 kg)  11/13/17 212 lb (96.2 kg)  02/20/17 214 lb (97.1 kg)       History of Present Illness: Mike Cole is a 67 y.o. male  who had an anterior MI In March 1751 complicated by cardiac arrest and subsequent ischemic cardiomyopathy. He had an AICD placed.  Last EF was 35-40% in 11/17.   He had a generator changeout a few years ago, and felt poorly after this.  It was difficult to walk.  He had his meds adjusted and he felt better, after Dr. Olin Pia visit.  All of his sx have resolved since then.  In 2018, we recommended: "He needs to find a primary care physician. A list of primary care physicians in the community was given today. We talked about the need for screening for colon cancer, prostate cancer as well as recommendations for pneumonia shot, flu shot among others."   Denies : Chest pain. Dizziness. Leg edema. Nitroglycerin use. Orthopnea. Palpitations. Paroxysmal nocturnal dyspnea. Shortness of breath. Syncope.   He joined a gym in 2018.  He does the treadmill and elliptical 5 days a week and lifts weights on the machines.   Past Medical History:  Diagnosis Date  . Acute anterior myocardial infarction (HCC)    BMS to the LAD  . Cardiogenic shock ([place])    resolved  . Dyslipidemia   . History of transient ischemic attack   . Ischemic cardiomyopathy    EF of 20-25%  . Ventricular tachycardia ([place])    arrest, resuscitated    Past Surgical History:  Procedure Laterality Date  . EP IMPLANTABLE DEVICE N/A 07/07/2016   Procedure: ICD Generator Changeout;  Surgeon: Deboraha Sprang, MD;  Location: [facility];  Service: Cardiovascular;  Laterality: N/A;  . St Jude CURRENT + DR  10/30/2008     Current Outpatient  Medications  Medication Sig Dispense Refill  . aspirin EC 81 MG tablet Take 81 mg by mouth daily.    [name] atorvastatin (LIPITOR) 40 MG tablet TAKE 1 TABLET BY MOUTH EVERY DAY AT 6PM 90 tablet 0  . benazepril (LOTENSIN) 40 MG tablet Take 20 mg by mouth daily. Take half a tablet by mouth daily.    . carvedilol (COREG) 3.125 MG tablet TAKE 1 TABLET BY MOUTH TWICE A DAY WITH A MEAL 180 tablet 2  . furosemide (LASIX) 20 MG tablet Take 1 tablet (20 mg total) by mouth daily. Please keep upcoming appt with Dr. Irish Lack for future refills. Thank you. 90 tablet 0  . Lecith-Inosi-Chol-B12-Liver (LIVERITE) 1.125 MCG TABS Take 1.125 mcg by mouth 2 (two) times daily.    . potassium chloride (MICRO-K) 10 MEQ CR capsule Take 1 capsule (10 mEq total) by mouth daily. 90 capsule 2  . spironolactone (ALDACTONE) 25 MG tablet TAKE 1/2 TABLET BY MOUTH DAILY 45 tablet 2   No current facility-administered medications for this visit.     Allergies:   Patient has no known allergies.    Social History:  The patient  reports that he quit smoking about 9 years ago. His smoking use included cigarettes. He has never used smokeless tobacco. He reports that he drinks alcohol. He reports that he  does not use drugs.   Family History:  The patient's family history includes CAD in his brother, brother, father, mother, and sister; Diabetes in his father; Heart disease in his brother.    ROS:  Please see the history of present illness.   Otherwise, review of systems are positive for pain at AICD when he bumps into something.   All other systems are reviewed and negative.    PHYSICAL EXAM: VS:  BP 118/80   Pulse 63   Ht 6\' 2"  (1.88 m)   Wt 209 lb 12.8 oz (95.2 kg)   SpO2 97%   BMI 26.94 kg/m  , BMI Body mass index is 26.94 kg/m. GEN: Well nourished, well developed, in no acute distress  HEENT: normal  Neck: no JVD, carotid bruits, or masses Cardiac: RRR; no murmurs, rubs, or gallops,no edema  Respiratory:  clear to  auscultation bilaterally, normal work of breathing GI: soft, nontender, nondistended, + BS MS: no deformity or atrophy  Skin: warm and dry, no rash Neuro:  Strength and sensation are intact Psych: euthymic mood, full affect   EKG:   The ekg ordered in June 2019 demonstrates sinus bradycardia, no ST segment changes   Recent Labs: 04/22/2018: ALT 18; BUN 13; Creatinine, Ser 1.19; Potassium 4.8; Sodium 140   Lipid Panel    Component Value Date/Time   CHOL 117 04/22/2018 0746   TRIG 62 04/22/2018 0746   HDL 50 04/22/2018 0746   CHOLHDL 2.3 04/22/2018 0746   CHOLHDL 2.4 02/14/2016 0922   VLDL 13 02/14/2016 0922   LDLCALC 55 04/22/2018 0746     Other studies Reviewed: Additional studies/ records that were reviewed today with results demonstrating: labs from 11/18 reviewed and within normal limits.   ASSESSMENT AND PLAN:  1. CAD/Old MI: No angina on medical therapy.  No symptoms of CHF.  He is exercising regularly.  He continues to need a primary care doctor.  We talked about the importance of screening for colon cancer and prostate cancer.  He would also likely need a pneumonia shot.  He has someone in mind to be his primary care doctor. 2. Hyperlipidemia: Lipids well controlled.  LDL 55 in November 2019. 3. HTN: Well-controlled.  Creatinine and potassium stable on yesterday's labs. 4. Chronic systolic heart failure: He appears euvolemic.  AICD in place.  He follows with the EP.   Current medicines are reviewed at length with the patient today.  The patient concerns regarding his medicines were addressed.  The following changes have been made:  No change  Labs/ tests ordered today include:  No orders of the defined types were placed in this encounter.   Recommend 150 minutes/week of aerobic exercise Low fat, low carb, high fiber diet recommended  Disposition:   FU in 1 year   Signed, Larae Grooms, MD  04/23/2018 8:16 AM    [facility]  [facility] [address], [place], [place]  [address] Phone: [phone]; Fax: [phone]

## 2018-04-23 ENCOUNTER — Encounter: Payer: Self-pay | Admitting: Interventional Cardiology

## 2018-04-23 ENCOUNTER — Ambulatory Visit (INDEPENDENT_AMBULATORY_CARE_PROVIDER_SITE_OTHER): Payer: PPO | Admitting: Interventional Cardiology

## 2018-04-23 VITALS — BP 118/80 | HR 63 | Ht 74.0 in | Wt 209.8 lb

## 2018-04-23 DIAGNOSIS — E785 Hyperlipidemia, unspecified: Secondary | ICD-10-CM | POA: Diagnosis not present

## 2018-04-23 DIAGNOSIS — I10 Essential (primary) hypertension: Secondary | ICD-10-CM

## 2018-04-23 DIAGNOSIS — I2510 Atherosclerotic heart disease of native coronary artery without angina pectoris: Secondary | ICD-10-CM | POA: Diagnosis not present

## 2018-04-23 DIAGNOSIS — I5022 Chronic systolic (congestive) heart failure: Secondary | ICD-10-CM

## 2018-04-23 NOTE — Patient Instructions (Signed)

## 2018-04-24 ENCOUNTER — Telehealth: Payer: Self-pay

## 2018-04-24 DIAGNOSIS — R748 Abnormal levels of other serum enzymes: Secondary | ICD-10-CM

## 2018-04-24 NOTE — Telephone Encounter (Signed)
Called and made patient aware of results. Patient will return 07/24/18 for repeat LFTS. Made patient aware that he still needs to find a PCP and have screening for colon and prostate cancer given his age. Patient verbalized understanding and thanked me for the call.

## 2018-04-24 NOTE — Telephone Encounter (Signed)
-----  Message from Jettie Booze, MD sent at 04/23/2018  5:23 PM EST ----- Normal liver function and lipids and electrolytes. Slightly increase alk phos.  Recheck LFTs in 3 months.  He should still get a PMD and have screening for colon and prostate cancer given his age.

## 2018-05-17 ENCOUNTER — Encounter: Payer: Self-pay | Admitting: Cardiology

## 2018-05-21 ENCOUNTER — Ambulatory Visit (INDEPENDENT_AMBULATORY_CARE_PROVIDER_SITE_OTHER): Payer: PPO

## 2018-05-21 DIAGNOSIS — I5022 Chronic systolic (congestive) heart failure: Secondary | ICD-10-CM

## 2018-05-21 DIAGNOSIS — I255 Ischemic cardiomyopathy: Secondary | ICD-10-CM

## 2018-05-21 NOTE — Progress Notes (Signed)
Remote ICD transmission.   

## 2018-05-22 ENCOUNTER — Encounter: Payer: Self-pay | Admitting: Cardiology

## 2018-06-20 ENCOUNTER — Other Ambulatory Visit: Payer: Self-pay | Admitting: Interventional Cardiology

## 2018-06-23 LAB — CUP PACEART REMOTE DEVICE CHECK
Implantable Lead Implant Date: 20100527
Implantable Pulse Generator Implant Date: 20180202
MDC IDC LEAD IMPLANT DT: 20100527
MDC IDC LEAD LOCATION: 753859
MDC IDC LEAD LOCATION: 753860
MDC IDC PG SERIAL: 7402114
MDC IDC SESS DTM: 20200119112157

## 2018-06-28 ENCOUNTER — Other Ambulatory Visit: Payer: Self-pay | Admitting: Interventional Cardiology

## 2018-07-24 ENCOUNTER — Other Ambulatory Visit: Payer: PPO | Admitting: *Deleted

## 2018-07-24 DIAGNOSIS — R748 Abnormal levels of other serum enzymes: Secondary | ICD-10-CM

## 2018-07-24 LAB — LIPID PANEL
Chol/HDL Ratio: 2.2 ratio (ref 0.0–5.0)
Cholesterol, Total: 108 mg/dL (ref 100–199)
HDL: 49 mg/dL (ref >39)
LDL Calculated: 47 mg/dL (ref 0–99)
Triglycerides: 60 mg/dL (ref 0–149)
VLDL Cholesterol Cal: 12 mg/dL (ref 5–40)

## 2018-08-20 ENCOUNTER — Other Ambulatory Visit: Payer: Self-pay

## 2018-08-20 ENCOUNTER — Ambulatory Visit (INDEPENDENT_AMBULATORY_CARE_PROVIDER_SITE_OTHER): Payer: PPO | Admitting: *Deleted

## 2018-08-20 DIAGNOSIS — I255 Ischemic cardiomyopathy: Secondary | ICD-10-CM

## 2018-08-21 LAB — CUP PACEART REMOTE DEVICE CHECK
Implantable Lead Implant Date: 20100527
Implantable Lead Implant Date: 20100527
Implantable Lead Location: 753859
Implantable Lead Location: 753860
Implantable Pulse Generator Implant Date: 20180202
MDC IDC SESS DTM: 20200318140006
Pulse Gen Serial Number: 7402114

## 2018-08-22 DIAGNOSIS — H5203 Hypermetropia, bilateral: Secondary | ICD-10-CM | POA: Diagnosis not present

## 2018-08-22 DIAGNOSIS — H524 Presbyopia: Secondary | ICD-10-CM | POA: Diagnosis not present

## 2018-08-22 DIAGNOSIS — H52223 Regular astigmatism, bilateral: Secondary | ICD-10-CM | POA: Diagnosis not present

## 2018-08-28 ENCOUNTER — Encounter: Payer: Self-pay | Admitting: Cardiology

## 2018-08-28 NOTE — Progress Notes (Signed)
Remote ICD transmission.   

## 2018-11-07 ENCOUNTER — Other Ambulatory Visit: Payer: Self-pay | Admitting: Interventional Cardiology

## 2018-11-07 NOTE — Telephone Encounter (Signed)
New Message    *STAT* If patient is at the pharmacy, call can be transferred to refill team.   1. Which medications need to be refilled? (please list name of each medication and dose if known) Spironolactone 25mg  1/2 tablet daily  2. Which pharmacy/location (including street and city if local pharmacy) is medication to be sent to? [facility] on [address]   3. Do they need a 30 day or 90 day supply? 90 day supply

## 2018-11-19 ENCOUNTER — Encounter: Payer: PPO | Admitting: *Deleted

## 2018-11-20 ENCOUNTER — Telehealth: Payer: Self-pay

## 2018-11-20 NOTE — Telephone Encounter (Signed)
Left message for patient to remind of missed remote transmission.  

## 2018-11-30 ENCOUNTER — Encounter: Payer: Self-pay | Admitting: Cardiology

## 2019-01-06 ENCOUNTER — Other Ambulatory Visit: Payer: Self-pay | Admitting: Interventional Cardiology

## 2019-01-08 ENCOUNTER — Other Ambulatory Visit: Payer: Self-pay | Admitting: Interventional Cardiology

## 2019-01-08 MED ORDER — BENAZEPRIL HCL 40 MG PO TABS: 40.0000 mg | ORAL_TABLET | Freq: Every day | ORAL | 0 refills | Status: DC

## 2019-01-08 NOTE — Telephone Encounter (Signed)
Pt's medication was sent to pt's pharmacy as requested. Confirmation received.  °

## 2019-01-08 NOTE — Telephone Encounter (Signed)
It looks like this refill has already been sent in

## 2019-02-03 ENCOUNTER — Other Ambulatory Visit: Payer: Self-pay | Admitting: Interventional Cardiology

## 2019-02-12 ENCOUNTER — Other Ambulatory Visit: Payer: Self-pay

## 2019-02-12 MED ORDER — CARVEDILOL 3.125 MG PO TABS: ORAL_TABLET | ORAL | 0 refills | Status: DC

## 2019-02-12 MED ORDER — POTASSIUM CHLORIDE ER 10 MEQ PO CPCR: 10.0000 meq | ORAL_CAPSULE | Freq: Every day | ORAL | 0 refills | Status: DC

## 2019-02-17 ENCOUNTER — Other Ambulatory Visit: Payer: Self-pay | Admitting: Internal Medicine

## 2019-02-21 DIAGNOSIS — M19071 Primary osteoarthritis, right ankle and foot: Secondary | ICD-10-CM | POA: Diagnosis not present

## 2019-03-19 ENCOUNTER — Ambulatory Visit: Payer: PPO | Admitting: Internal Medicine

## 2019-03-19 ENCOUNTER — Other Ambulatory Visit: Payer: Self-pay

## 2019-03-19 ENCOUNTER — Encounter: Payer: Self-pay | Admitting: Internal Medicine

## 2019-03-19 VITALS — BP 140/74 | HR 59 | Ht 74.0 in | Wt 214.4 lb

## 2019-03-19 DIAGNOSIS — I2510 Atherosclerotic heart disease of native coronary artery without angina pectoris: Secondary | ICD-10-CM

## 2019-03-19 DIAGNOSIS — I255 Ischemic cardiomyopathy: Secondary | ICD-10-CM | POA: Diagnosis not present

## 2019-03-19 DIAGNOSIS — I472 Ventricular tachycardia, unspecified: Secondary | ICD-10-CM

## 2019-03-19 DIAGNOSIS — E785 Hyperlipidemia, unspecified: Secondary | ICD-10-CM | POA: Diagnosis not present

## 2019-03-19 DIAGNOSIS — Z95810 Presence of automatic (implantable) cardiac defibrillator: Secondary | ICD-10-CM

## 2019-03-19 NOTE — Progress Notes (Signed)
Patient Care Team: Patient, No Pcp Per as PCP - General (General Practice)   HPI  Mike Cole is a 68 y.o. male seen in followup for ventricular tachycardia/fibrillation presented as an out of hospital cardiac arrest. He  underwent intervention of an occluded LAD but had persistent left ventricular dysfunction prompting dual chamber ICD implantation St Jude/  Generator replacement 2/18.  He has atrial lead failure prompting reprogramming of the device DDD-VVI lower rate of 40, this to avoid ventricular pacing  He has moderate shortness of breath with activity.  Denies chest pain or peripheral edema.  He has some just bad days.  He makes bad days better by eating Oreos..  The patient denies chest pain, shortness of breath, nocturnal dyspnea, orthopnea or peripheral edema.  There have been no palpitations, lightheadedness or syncope.    Covid infection    DATE TEST EF   5/10 Echo 30-35 %   11/17 Echo 35-40 %   6/19 Echo  35-40% Anterior AK       Date Cr K LDL  1/18  1.18 4.6    6/18 1.23 4.4  53  11/19 1.19 4.8       Past Medical History:  Diagnosis Date  . Acute anterior myocardial infarction (HCC)    BMS to the LAD  . Cardiogenic shock ([place])    resolved  . Dyslipidemia   . History of transient ischemic attack   . Ischemic cardiomyopathy    EF of 20-25%  . Ventricular tachycardia ([place])    arrest, resuscitated    Past Surgical History:  Procedure Laterality Date  . EP IMPLANTABLE DEVICE N/A 07/07/2016   Procedure: ICD Generator Changeout;  Surgeon: Deboraha Sprang, MD;  Location: [facility] [facility];  Service: Cardiovascular;  Laterality: N/A;  . St Jude CURRENT + DR  10/30/2008    Current Outpatient Medications  Medication Sig Dispense Refill  . aspirin EC 81 MG tablet Take 81 mg by mouth daily.    [name] atorvastatin (LIPITOR) 40 MG tablet TAKE 1 TABLET BY MOUTH EVERY DAY AT 6PM 90 tablet 3  . benazepril (LOTENSIN) 40 MG tablet Take 20 mg by mouth daily.    .  carvedilol (COREG) 3.125 MG tablet TAKE 1 TABLET BY MOUTH TWICE A DAY WITH A MEAL 180 tablet 0  . furosemide (LASIX) 20 MG tablet Take 20 mg by mouth as directed. Takes 5 days a week    . Lecith-Inosi-Chol-B12-Liver (LIVERITE) 1.125 MCG TABS Take 1.125 mcg by mouth 2 (two) times daily.    . potassium chloride (MICRO-K) 10 MEQ CR capsule Take 10 mEq by mouth as directed. Takes 5 days a week    . spironolactone (ALDACTONE) 25 MG tablet Take 0.5 tablets (12.5 mg total) by mouth daily. Pt needs to keep upcoming appt in Nov for further refills. Thanks 45 tablet 0   No current facility-administered medications for this visit.     No Known Allergies  Review of Systems negative except from HPI and PMH  Physical Exam BP 140/74   Pulse (!) 59   Ht 6\' 2"  (1.88 m)   Wt 214 lb 6.4 oz (97.3 kg)   SpO2 97%   BMI 27.53 kg/m  Well developed and well nourished in no acute distress HENT normal Neck supple with JVP-flat Clear Device pocket well healed; without hematoma or erythema.  There is no tethering  Regular rate and rhythm, no murmur Abd-soft with active BS No Clubbing cyanosis  edema Skin-warm and  dry A & Oriented  Grossly normal sensory and motor function  ECG Sinus @ 59 18/07/40 Low volts PVC  Assessment and  Plan  Ischemic cardiomyopathy  Aborted Cardiac Arrest   Implantable defibrillator-St. Jude The patient's device was interrogated.  The information was reviewed. No changes were made in the programming.    Hyperlipidemia  Lowvoltage ECG   High Risk Medication Surveillance Aldactone  Atrial lead failure  Sinus node dysfunction     Without symptoms of ischemia  No intercurrent Ventricular tachycardia  Will check BMET on aldactone

## 2019-03-19 NOTE — Patient Instructions (Addendum)
Medication Instructions:  Your physician recommends that you continue on your current medications as directed. Please refer to the Current Medication list given to you today.  Labwork: You will get lab work today:  BMP  Testing/Procedures: None ordered.  Follow-Up: Your physician wants you to follow-up in: 7 months with Dr. Caryl Comes.   You will receive a reminder letter in the mail two months in advance. If you don't receive a letter, please call our office to schedule the follow-up appointment.  Remote monitoring is used to monitor your ICD from home. This monitoring reduces the number of office visits required to check your device to one time per year. It allows [place] to keep an eye on the functioning of your device to ensure it is working properly. You are scheduled for a device check from home on 06/18/2019. You may send your transmission at any time that day. If you have a wireless device, the transmission will be sent automatically. After your physician reviews your transmission, you will receive a postcard with your next transmission date.  Any Other Special Instructions Will Be Listed Below (If Applicable).  If you need a refill on your cardiac medications before your next appointment, please call your pharmacy.

## 2019-03-20 LAB — BASIC METABOLIC PANEL
BUN/Creatinine Ratio: 10 (ref 10–24)
BUN: 11 mg/dL (ref 8–27)
CO2: 25 mmol/L (ref 20–29)
Calcium: 9.1 mg/dL (ref 8.6–10.2)
Chloride: 105 mmol/L (ref 96–106)
Creatinine, Ser: 1.08 mg/dL (ref 0.76–1.27)
GFR calc Af Amer: 82 mL/min/{1.73_m2} (ref >59)
GFR calc non Af Amer: 71 mL/min/{1.73_m2} (ref >59)
Glucose: 90 mg/dL (ref 65–99)
Potassium: 4.8 mmol/L (ref 3.5–5.2)
Sodium: 141 mmol/L (ref 134–144)

## 2019-03-21 ENCOUNTER — Ambulatory Visit (INDEPENDENT_AMBULATORY_CARE_PROVIDER_SITE_OTHER): Payer: PPO | Admitting: *Deleted

## 2019-03-21 DIAGNOSIS — I472 Ventricular tachycardia, unspecified: Secondary | ICD-10-CM

## 2019-03-21 DIAGNOSIS — I255 Ischemic cardiomyopathy: Secondary | ICD-10-CM

## 2019-03-24 LAB — CUP PACEART REMOTE DEVICE CHECK
Date Time Interrogation Session: 20201019034446
Implantable Lead Implant Date: 20100527
Implantable Lead Implant Date: 20100527
Implantable Lead Location: 753859
Implantable Lead Location: 753860
Implantable Pulse Generator Implant Date: 20180202
Pulse Gen Serial Number: 7402114

## 2019-03-26 NOTE — Progress Notes (Signed)
Remote ICD transmission.   

## 2019-03-31 LAB — CUP PACEART INCLINIC DEVICE CHECK
Battery Remaining Longevity: 79 mo
Brady Statistic RV Percent Paced: 0 %
Date Time Interrogation Session: 20201014190422
HighPow Impedance: 76.5 Ohm
Implantable Lead Implant Date: 20100527
Implantable Lead Implant Date: 20100527
Implantable Lead Location: 753859
Implantable Lead Location: 753860
Implantable Pulse Generator Implant Date: 20180202
Lead Channel Impedance Value: 387.5 Ohm
Lead Channel Pacing Threshold Amplitude: 1.25 V
Lead Channel Pacing Threshold Pulse Width: 0.5 ms
Lead Channel Sensing Intrinsic Amplitude: 11.7 mV
Lead Channel Setting Pacing Amplitude: 2.5 V
Lead Channel Setting Pacing Pulse Width: 0.5 ms
Lead Channel Setting Sensing Sensitivity: 0.5 mV
Pulse Gen Serial Number: 7402114

## 2019-05-04 NOTE — Progress Notes (Signed)
Cardiology Office Note   Date:  05/05/2019   ID:  Taksh, Gring 11-28-1950, MRN [id number]  PCP:  Patient, No Pcp Per    No chief complaint on file.  CAD  Wt Readings from Last 3 Encounters:  05/05/19 215 lb 1.9 oz (97.6 kg)  03/19/19 214 lb 6.4 oz (97.3 kg)  04/23/18 209 lb 12.8 oz (95.2 kg)       History of Present Illness: Mike Cole is a 68 y.o. male  who had an anterior MI In March [id number] complicated by cardiac arrest and subsequent ischemic cardiomyopathy.He had an AICD placed.Last EF was35-40% in11/17.  He had a generator changeout a few years ago, and felt poorly after this. It was difficult to walk. He had his meds adjusted and he felt better, after Dr. Olin Pia visit. All of his sx have resolved since then.  In 2018, we recommended: "He needsto find a primary care physician. A list of primary care physicians in the community was given today. We talked about the need for screening for colon cancer, prostate cancer as well as recommendations for pneumonia shot,flu shot among others."    He joined a gym in 2018.  He does the treadmill and elliptical 5 days a week and lifts weights on the machines.  With COVID, gym closed.  Exercise has decreased.  He walks  a little.  He has been doing things around the house.   Denies : Chest pain. Dizziness. Leg edema. Nitroglycerin use. Orthopnea. Palpitations. Paroxysmal nocturnal dyspnea. Shortness of breath. Syncope.      Past Medical History:  Diagnosis Date   Acute anterior myocardial infarction (HCC)    BMS to the LAD   Cardiogenic shock ([place])    resolved   Dyslipidemia    History of transient ischemic attack    Ischemic cardiomyopathy    EF of 20-25%   Ventricular tachycardia ([place])    arrest, resuscitated    Past Surgical History:  Procedure Laterality Date   EP IMPLANTABLE DEVICE N/A 07/07/2016   Procedure: ICD Generator Changeout;  Surgeon: Deboraha Sprang, MD;  Location: [facility];  Service: Cardiovascular;  Laterality: N/A;   St Jude CURRENT + DR  10/30/2008     Current Outpatient Medications  Medication Sig Dispense Refill   aspirin EC 81 MG tablet Take 81 mg by mouth daily.     atorvastatin (LIPITOR) 40 MG tablet TAKE 1 TABLET BY MOUTH EVERY DAY AT 6PM 90 tablet 3   benazepril (LOTENSIN) 40 MG tablet Take 20 mg by mouth daily.     carvedilol (COREG) 3.125 MG tablet TAKE 1 TABLET BY MOUTH TWICE A DAY WITH A MEAL 180 tablet 0   furosemide (LASIX) 20 MG tablet Take 20 mg by mouth as directed. Takes 5 days a week     Lecith-Inosi-Chol-B12-Liver (LIVERITE) 1.125 MCG TABS Take 1.125 mcg by mouth 2 (two) times daily.     potassium chloride (MICRO-K) 10 MEQ CR capsule Take 10 mEq by mouth as directed. Takes 5 days a week     spironolactone (ALDACTONE) 25 MG tablet Take 0.5 tablets (12.5 mg total) by mouth daily. Pt needs to keep upcoming appt in Nov for further refills. Thanks 45 tablet 0   No current facility-administered medications for this visit.     Allergies:   Patient has no known allergies.    Social History:  The patient  reports that he quit smoking about 10 years ago. His  smoking use included cigarettes. He has never used smokeless tobacco. He reports current alcohol use. He reports that he does not use drugs.   Family History:  The patient's family history includes CAD in his brother, brother, father, mother, and sister; Diabetes in his father; Heart disease in his brother.    ROS:  Please see the history of present illness.   Otherwise, review of systems are positive for mild weight gain.   All other systems are reviewed and negative.    PHYSICAL EXAM: VS:  BP 118/68    Pulse 65    Ht 6\' 2"  (1.88 m)    Wt 215 lb 1.9 oz (97.6 kg)    SpO2 98%    BMI 27.62 kg/m  , BMI Body mass index is 27.62 kg/m. GEN: Well nourished, well developed, in no acute distress  HEENT: normal  Neck: no JVD, carotid bruits, or masses Cardiac: RRR; no  murmurs, rubs, or gallops,no edema  Respiratory:  clear to auscultation bilaterally, normal work of breathing GI: soft, nontender, nondistended, + BS MS: no deformity or atrophy  Skin: warm and dry, no rash Neuro:  Strength and sensation are intact Psych: euthymic mood, full affect   EKG:   The ekg ordered today demonstrates sinus bradycardia, PVC, no ST segments   Recent Labs: 03/19/2019: BUN 11; Creatinine, Ser 1.08; Potassium 4.8; Sodium 141   Lipid Panel    Component Value Date/Time   CHOL 108 07/24/2018 0802   TRIG 60 07/24/2018 0802   HDL 49 07/24/2018 0802   CHOLHDL 2.2 07/24/2018 0802   CHOLHDL 2.4 02/14/2016 0922   VLDL 13 02/14/2016 0922   LDLCALC 47 07/24/2018 0802     Other studies Reviewed: Additional studies/ records that were reviewed today with results demonstrating: labs reviewed.   ASSESSMENT AND PLAN:  1. CAD/Old MI: No angina.  Continue aggressive secondary prevention.  He is staying out of crowds as well due to [place].  Increase exercise.  2. Hyperlipidemia: The current medical regimen is effective;  continue present plan and medications. 3. HTN: The current medical regimen is effective;  continue present plan and medications. 4. Chronic systolic heart failure: AICD in place.  Followed by Dr. Caryl Comes.  He will see Dr. Caryl Comes in 6 months.    Current medicines are reviewed at length with the patient today.  The patient concerns regarding his medicines were addressed.  The following changes have been made:  No change  Labs/ tests ordered today include: lipids in 04/2019 No orders of the defined types were placed in this encounter.   Recommend 150 minutes/week of aerobic exercise Low fat, low carb, high fiber diet recommended  Disposition:   FU in 1 year   Signed, Larae Grooms, MD  05/05/2019 8:36 AM    [facility] [address], [place], [place]  [address] Phone: [phone]; Fax: [phone]

## 2019-05-05 ENCOUNTER — Ambulatory Visit (INDEPENDENT_AMBULATORY_CARE_PROVIDER_SITE_OTHER): Payer: PPO | Admitting: Interventional Cardiology

## 2019-05-05 ENCOUNTER — Encounter: Payer: Self-pay | Admitting: Interventional Cardiology

## 2019-05-05 ENCOUNTER — Other Ambulatory Visit: Payer: Self-pay

## 2019-05-05 VITALS — BP 118/68 | HR 65 | Ht 74.0 in | Wt 215.1 lb

## 2019-05-05 DIAGNOSIS — Z23 Encounter for immunization: Secondary | ICD-10-CM

## 2019-05-05 DIAGNOSIS — I2510 Atherosclerotic heart disease of native coronary artery without angina pectoris: Secondary | ICD-10-CM | POA: Diagnosis not present

## 2019-05-05 DIAGNOSIS — I5022 Chronic systolic (congestive) heart failure: Secondary | ICD-10-CM

## 2019-05-05 DIAGNOSIS — Z95810 Presence of automatic (implantable) cardiac defibrillator: Secondary | ICD-10-CM | POA: Diagnosis not present

## 2019-05-05 DIAGNOSIS — I10 Essential (primary) hypertension: Secondary | ICD-10-CM | POA: Diagnosis not present

## 2019-05-05 DIAGNOSIS — E782 Mixed hyperlipidemia: Secondary | ICD-10-CM | POA: Diagnosis not present

## 2019-05-05 NOTE — Patient Instructions (Signed)
Medication Instructions:  Your physician recommends that you continue on your current medications as directed. Please refer to the Current Medication list given to you today.  *If you need a refill on your cardiac medications before your next appointment, please call your pharmacy*  Lab Work: None ordered  If you have labs (blood work) drawn today and your tests are completely normal, you will receive your results only by: [name] MyChart Message (if you have MyChart) OR . A paper copy in the mail If you have any lab test that is abnormal or we need to change your treatment, we will call you to review the results.  Testing/Procedures: None ordered  Follow-Up: At [facility], you and your health needs are our priority.  As part of our continuing mission to provide you with exceptional heart care, we have created designated Provider Care Teams.  These Care Teams include your primary Cardiologist (physician) and Advanced Practice Providers (APPs -  Physician Assistants and Nurse Practitioners) who all work together to provide you with the care you need, when you need it.  Your next appointment:   12 month(s)  The format for your next appointment:   In Person  Provider:   You may see Larae Grooms, MD or one of the following Advanced Practice Providers on your designated Care Team:    Melina Copa, PA-C  Ermalinda Barrios, PA-C   Other Instructions  Heart-Healthy Eating Plan Heart-healthy meal planning includes:  Eating less unhealthy fats.  Eating more healthy fats.  Making other changes in your diet. Talk with your doctor or a diet specialist (dietitian) to create an eating plan that is right for you. What is my plan? Your doctor may recommend an eating plan that includes:  Total fat: ______% or less of total calories a day.  Saturated fat: ______% or less of total calories a day.  Cholesterol: less than _________mg a day. What are tips for following this plan? Cooking  Avoid frying your food. Try to bake, boil, grill, or broil it instead. You can also reduce fat by:  Removing the skin from poultry.  Removing all visible fats from meats.  Steaming vegetables in water or broth. Meal planning   At meals, divide your plate into four equal parts: ? Fill one-half of your plate with vegetables and green salads. ? Fill one-fourth of your plate with whole grains. ? Fill one-fourth of your plate with lean protein foods.  Eat 4-5 servings of vegetables per day. A serving of vegetables is: ? 1 cup of raw or cooked vegetables. ? 2 cups of raw leafy greens.  Eat 4-5 servings of fruit per day. A serving of fruit is: ? 1 medium whole fruit. ?  cup of dried fruit. ?  cup of fresh, frozen, or canned fruit. ?  cup of 100% fruit juice.  Eat more foods that have soluble fiber. These are apples, broccoli, carrots, beans, peas, and barley. Try to get 20-30 g of fiber per day.  Eat 4-5 servings of nuts, legumes, and seeds per week: ? 1 serving of dried beans or legumes equals  cup after being cooked. ? 1 serving of nuts is  cup. ? 1 serving of seeds equals 1 tablespoon. General information  Eat more home-cooked food. Eat less restaurant, buffet, and fast food.  Limit or avoid alcohol.  Limit foods that are high in starch and sugar.  Avoid fried foods.  Lose weight if you are overweight.  Keep track of how much salt (  sodium) you eat. This is important if you have high blood pressure. Ask your doctor to tell you more about this.  Try to add vegetarian meals each week. Fats  Choose healthy fats. These include olive oil and canola oil, flaxseeds, walnuts, almonds, and seeds.  Eat more omega-3 fats. These include salmon, mackerel, sardines, tuna, flaxseed oil, and ground flaxseeds. Try to eat fish at least 2 times each week.  Check food labels. Avoid foods with trans fats or high amounts of saturated fat.  Limit saturated fats. ? These are often  found in animal products, such as meats, butter, and cream. ? These are also found in plant foods, such as palm oil, palm kernel oil, and coconut oil.  Avoid foods with partially hydrogenated oils in them. These have trans fats. Examples are stick margarine, some tub margarines, cookies, crackers, and other baked goods. What foods can I eat? Fruits All fresh, canned (in natural juice), or frozen fruits. Vegetables Fresh or frozen vegetables (raw, steamed, roasted, or grilled). Green salads. Grains Most grains. Choose whole wheat and whole grains most of the time. Rice and pasta, including brown rice and pastas made with whole wheat. Meats and other proteins Lean, well-trimmed beef, veal, pork, and lamb. Chicken and [place] without skin. All fish and shellfish. Wild duck, rabbit, pheasant, and venison. Egg whites or low-cholesterol egg substitutes. Dried beans, peas, lentils, and tofu. Seeds and most nuts. Dairy Low-fat or nonfat cheeses, including ricotta and mozzarella. Skim or 1% milk that is liquid, powdered, or evaporated. Buttermilk that is made with low-fat milk. Nonfat or low-fat yogurt. Fats and oils Non-hydrogenated (trans-free) margarines. Vegetable oils, including soybean, sesame, sunflower, olive, peanut, safflower, corn, canola, and cottonseed. Salad dressings or mayonnaise made with a vegetable oil. Beverages Mineral water. Coffee and tea. Diet carbonated beverages. Sweets and desserts Sherbet, gelatin, and fruit ice. Small amounts of dark chocolate. Limit all sweets and desserts. Seasonings and condiments All seasonings and condiments. The items listed above may not be a complete list of foods and drinks you can eat. Contact a dietitian for more options. What foods should I avoid? Fruits Canned fruit in heavy syrup. Fruit in cream or butter sauce. Fried fruit. Limit coconut. Vegetables Vegetables cooked in cheese, cream, or butter sauce. Fried vegetables. Grains Breads  that are made with saturated or trans fats, oils, or whole milk. Croissants. Sweet rolls. Donuts. High-fat crackers, such as cheese crackers. Meats and other proteins Fatty meats, such as hot dogs, ribs, sausage, bacon, rib-eye roast or steak. High-fat deli meats, such as salami and bologna. Caviar. Domestic duck and goose. Organ meats, such as liver. Dairy Cream, sour cream, cream cheese, and creamed cottage cheese. Whole-milk cheeses. Whole or 2% milk that is liquid, evaporated, or condensed. Whole buttermilk. Cream sauce or high-fat cheese sauce. Yogurt that is made from whole milk. Fats and oils Meat fat, or shortening. Cocoa butter, hydrogenated oils, palm oil, coconut oil, palm kernel oil. Solid fats and shortenings, including bacon fat, salt pork, lard, and butter. Nondairy cream substitutes. Salad dressings with cheese or sour cream. Beverages Regular sodas and juice drinks with added sugar. Sweets and desserts Frosting. Pudding. Cookies. Cakes. Pies. Milk chocolate or white chocolate. Buttered syrups. Full-fat ice cream or ice cream drinks. The items listed above may not be a complete list of foods and drinks to avoid. Contact a dietitian for more information. Summary  Heart-healthy meal planning includes eating less unhealthy fats, eating more healthy fats, and making other changes in  your diet.  Eat a balanced diet. This includes fruits and vegetables, low-fat or nonfat dairy, lean protein, nuts and legumes, whole grains, and heart-healthy oils and fats. This information is not intended to replace advice given to you by your health care provider. Make sure you discuss any questions you have with your health care provider. Document Released: 11/21/2011 Document Revised: 07/26/2017 Document Reviewed: 06/29/2017 Elsevier Patient Education  2020 [employer].

## 2019-05-11 ENCOUNTER — Other Ambulatory Visit: Payer: Self-pay | Admitting: Internal Medicine

## 2019-05-21 ENCOUNTER — Other Ambulatory Visit: Payer: Self-pay | Admitting: Interventional Cardiology

## 2019-05-22 ENCOUNTER — Other Ambulatory Visit: Payer: Self-pay | Admitting: Interventional Cardiology

## 2019-06-12 ENCOUNTER — Other Ambulatory Visit: Payer: Self-pay | Admitting: Interventional Cardiology

## 2019-06-16 ENCOUNTER — Other Ambulatory Visit: Payer: Self-pay | Admitting: Interventional Cardiology

## 2019-06-17 MED ORDER — BENAZEPRIL HCL 40 MG PO TABS: 20.0000 mg | ORAL_TABLET | Freq: Every day | ORAL | 3 refills | Status: DC

## 2019-06-17 MED ORDER — FUROSEMIDE 20 MG PO TABS: 20.0000 mg | ORAL_TABLET | ORAL | 11 refills | Status: DC

## 2019-06-20 ENCOUNTER — Ambulatory Visit (INDEPENDENT_AMBULATORY_CARE_PROVIDER_SITE_OTHER): Payer: PPO | Admitting: *Deleted

## 2019-06-20 DIAGNOSIS — I255 Ischemic cardiomyopathy: Secondary | ICD-10-CM | POA: Diagnosis not present

## 2019-06-21 LAB — CUP PACEART REMOTE DEVICE CHECK
Battery Remaining Longevity: 75 mo
Battery Remaining Percentage: 72 %
Battery Voltage: 2.96 V
Brady Statistic RV Percent Paced: 0 %
Date Time Interrogation Session: 20210115092504
HighPow Impedance: 69 Ohm
HighPow Impedance: 69 Ohm
Implantable Lead Implant Date: 20100527
Implantable Lead Implant Date: 20100527
Implantable Lead Location: 753859
Implantable Lead Location: 753860
Implantable Pulse Generator Implant Date: 20180202
Lead Channel Impedance Value: 380 Ohm
Lead Channel Pacing Threshold Amplitude: 1.25 V
Lead Channel Pacing Threshold Pulse Width: 0.5 ms
Lead Channel Sensing Intrinsic Amplitude: 11.7 mV
Lead Channel Setting Pacing Amplitude: 2.5 V
Lead Channel Setting Pacing Pulse Width: 0.5 ms
Lead Channel Setting Sensing Sensitivity: 0.5 mV
Pulse Gen Serial Number: 7402114

## 2019-09-17 DIAGNOSIS — L570 Actinic keratosis: Secondary | ICD-10-CM | POA: Diagnosis not present

## 2019-09-17 DIAGNOSIS — L821 Other seborrheic keratosis: Secondary | ICD-10-CM | POA: Diagnosis not present

## 2019-09-19 ENCOUNTER — Ambulatory Visit (INDEPENDENT_AMBULATORY_CARE_PROVIDER_SITE_OTHER): Payer: PPO | Admitting: *Deleted

## 2019-09-19 DIAGNOSIS — I255 Ischemic cardiomyopathy: Secondary | ICD-10-CM | POA: Diagnosis not present

## 2019-09-19 LAB — CUP PACEART REMOTE DEVICE CHECK
Battery Remaining Longevity: 73 mo
Battery Remaining Percentage: 69 %
Battery Voltage: 2.96 V
Brady Statistic RV Percent Paced: 1 %
Date Time Interrogation Session: 20210416091532
HighPow Impedance: 70 Ohm
HighPow Impedance: 70 Ohm
Implantable Lead Implant Date: 20100527
Implantable Lead Implant Date: 20100527
Implantable Lead Location: 753859
Implantable Lead Location: 753860
Implantable Pulse Generator Implant Date: 20180202
Lead Channel Impedance Value: 430 Ohm
Lead Channel Pacing Threshold Amplitude: 1.25 V
Lead Channel Pacing Threshold Pulse Width: 0.5 ms
Lead Channel Sensing Intrinsic Amplitude: 11.7 mV
Lead Channel Setting Pacing Amplitude: 2.5 V
Lead Channel Setting Pacing Pulse Width: 0.5 ms
Lead Channel Setting Sensing Sensitivity: 0.5 mV
Pulse Gen Serial Number: 7402114

## 2019-09-19 NOTE — Progress Notes (Signed)
ICD Remote  

## 2019-10-12 DIAGNOSIS — Z95810 Presence of automatic (implantable) cardiac defibrillator: Secondary | ICD-10-CM | POA: Insufficient documentation

## 2019-10-12 DIAGNOSIS — I495 Sick sinus syndrome: Secondary | ICD-10-CM | POA: Insufficient documentation

## 2019-10-13 ENCOUNTER — Encounter: Payer: Self-pay | Admitting: Internal Medicine

## 2019-10-13 ENCOUNTER — Ambulatory Visit: Payer: PPO | Admitting: Internal Medicine

## 2019-10-13 ENCOUNTER — Other Ambulatory Visit: Payer: Self-pay

## 2019-10-13 VITALS — BP 108/68 | HR 53 | Ht 74.0 in | Wt 212.0 lb

## 2019-10-13 DIAGNOSIS — I255 Ischemic cardiomyopathy: Secondary | ICD-10-CM | POA: Diagnosis not present

## 2019-10-13 DIAGNOSIS — R001 Bradycardia, unspecified: Secondary | ICD-10-CM | POA: Diagnosis not present

## 2019-10-13 DIAGNOSIS — I495 Sick sinus syndrome: Secondary | ICD-10-CM | POA: Diagnosis not present

## 2019-10-13 DIAGNOSIS — Z95810 Presence of automatic (implantable) cardiac defibrillator: Secondary | ICD-10-CM

## 2019-10-13 NOTE — Patient Instructions (Signed)
Medication Instructions:  Your physician recommends that you continue on your current medications as directed. Please refer to the Current Medication list given to you today.  Labwork: BMET today  Testing/Procedures: None ordered.  Follow-Up: Your physician wants you to follow-up in:12 months with Dr Caryl Comes. You will receive a reminder letter in the mail two months in advance. If you don't receive a letter, please call our office to schedule the follow-up appointment.  Remote monitoring is used to monitor your Pacemaker of ICD from home. This monitoring reduces the number of office visits required to check your device to one time per year. It allows [place] to keep an eye on the functioning of your device to ensure it is working properly.  Any Other Special Instructions Will Be Listed Below (If Applicable).  If you need a refill on your cardiac medications before your next appointment, please call your pharmacy.

## 2019-10-13 NOTE — Progress Notes (Signed)
Patient Care Team: Patient, No Pcp Per as PCP - General ([place]) Jettie Booze, MD as PCP - Cardiology (Cardiology)   HPI  Mike Cole is a 69 y.o. male seen in followup for ventricular tachycardia/fibrillation presented as an out of hospital cardiac arrest. He  underwent intervention of an occluded LAD but had persistent left ventricular dysfunction prompting dual chamber ICD implantation St Jude/  Generator replacement 2/18.  He has atrial lead failure prompting reprogramming of the device DDD-VVI lower rate of 40, this to avoid ventricular pacing  The patient denies chest pain, shortness of breath, nocturnal dyspnea, orthopnea or peripheral edema.  There have been no palpitations, lightheadedness or syncope.     DATE TEST EF   5/10 Echo 30-35 %   11/17 Echo 35-40 %   6/19 Echo  35-40% Anterior AK       Date Cr K LDL  1/18  1.18 4.6    6/18 1.23 4.4  53  11/19 1.19 4.8   10/20 1.08 4.8       Past Medical History:  Diagnosis Date  . Acute anterior myocardial infarction (HCC)    BMS to the LAD  . Cardiogenic shock ([place])    resolved  . Dyslipidemia   . History of transient ischemic attack   . Ischemic cardiomyopathy    EF of 20-25%  . Ventricular tachycardia ([place])    arrest, resuscitated    Past Surgical History:  Procedure Laterality Date  . EP IMPLANTABLE DEVICE N/A 07/07/2016   Procedure: ICD Generator Changeout;  Surgeon: Deboraha Sprang, MD;  Location: [facility];  Service: Cardiovascular;  Laterality: N/A;  . St Jude CURRENT + DR  10/30/2008    Current Outpatient Medications  Medication Sig Dispense Refill  . aspirin EC 81 MG tablet Take 81 mg by mouth daily.    [name] atorvastatin (LIPITOR) 40 MG tablet TAKE 1 TABLET BY MOUTH EVERY DAY AT 6PM 90 tablet 3  . benazepril (LOTENSIN) 40 MG tablet Take 0.5 tablets (20 mg total) by mouth daily. 45 tablet 3  . carvedilol (COREG) 3.125 MG tablet TAKE 1 TABLET BY MOUTH TWICE A DAY WITH MEALS 180  tablet 3  . furosemide (LASIX) 20 MG tablet Take 1 tablet (20 mg total) by mouth as directed. Takes 5 days a week 30 tablet 11  . Lecith-Inosi-Chol-B12-Liver (LIVERITE) 1.125 MCG TABS Take 1.125 mcg by mouth 2 (two) times daily.    . potassium chloride (MICRO-K) 10 MEQ CR capsule TAKE 1 CAPSULE BY MOUTH EVERY DAY 90 capsule 1  . spironolactone (ALDACTONE) 25 MG tablet Take 0.5 tablets (12.5 mg total) by mouth daily. 45 tablet 3   No current facility-administered medications for this visit.    No Known Allergies  Review of Systems negative except from HPI and PMH  Physical Exam BP 108/68   Pulse (!) 53   Ht 6\' 2"  (1.88 m)   Wt 212 lb (96.2 kg)   SpO2 98%   BMI 27.22 kg/m  Well developed and well nourished in no acute distress HENT normal Neck supple with JVP-flat Clear Device pocket well healed; without hematoma or erythema.  There is no tethering  Regular rate and rhythm, no  * murmur Abd-soft with active BS No Clubbing cyanosis   edema Skin-warm and dry A & Oriented  Grossly normal sensory and motor function  ECG sinus at 53 Intervals 18/07/43 Low voltage  Assessment and  Plan  Ischemic cardiomyopathy  Aborted Cardiac  Arrest   Implantable defibrillator-St. Jude The patient's device was interrogated.  The information was reviewed. No changes were made in the programming.     Lowvoltage ECG   High Risk Medication Surveillance Aldactone  Atrial lead failure  Sinus node dysfunction    Without symptoms of ischemia  Not withstanding sinus bradycardia, no symptoms of chronotropic incompetence.  No clear explanation as to the low voltage ECG.  We will check surveillance laboratories on the Aldactone  No entresto 2/2 functional status

## 2019-10-14 LAB — BASIC METABOLIC PANEL
BUN/Creatinine Ratio: 11 (ref 10–24)
BUN: 13 mg/dL (ref 8–27)
CO2: 22 mmol/L (ref 20–29)
Calcium: 9.3 mg/dL (ref 8.6–10.2)
Chloride: 105 mmol/L (ref 96–106)
Creatinine, Ser: 1.16 mg/dL (ref 0.76–1.27)
GFR calc Af Amer: 74 mL/min/{1.73_m2} (ref >59)
GFR calc non Af Amer: 64 mL/min/{1.73_m2} (ref >59)
Glucose: 98 mg/dL (ref 65–99)
Potassium: 4.3 mmol/L (ref 3.5–5.2)
Sodium: 142 mmol/L (ref 134–144)

## 2019-10-21 LAB — CUP PACEART INCLINIC DEVICE CHECK
Battery Remaining Longevity: 74 mo
Brady Statistic RA Percent Paced: 0 %
Brady Statistic RV Percent Paced: 1 % — CL
Date Time Interrogation Session: 20210510165330
HighPow Impedance: 78.75 Ohm
Implantable Lead Implant Date: 20100527
Implantable Lead Implant Date: 20100527
Implantable Lead Location: 753859
Implantable Lead Location: 753860
Implantable Pulse Generator Implant Date: 20180202
Lead Channel Impedance Value: 400 Ohm
Lead Channel Impedance Value: 450 Ohm
Lead Channel Pacing Threshold Amplitude: 1.25 V
Lead Channel Pacing Threshold Amplitude: 1.25 V
Lead Channel Pacing Threshold Pulse Width: 0.5 ms
Lead Channel Pacing Threshold Pulse Width: 0.5 ms
Lead Channel Sensing Intrinsic Amplitude: 0.3 mV
Lead Channel Sensing Intrinsic Amplitude: 11.7 mV
Lead Channel Setting Pacing Amplitude: 2.5 V
Lead Channel Setting Pacing Pulse Width: 0.5 ms
Lead Channel Setting Sensing Sensitivity: 0.5 mV
Pulse Gen Serial Number: 7402114

## 2019-12-19 ENCOUNTER — Ambulatory Visit (INDEPENDENT_AMBULATORY_CARE_PROVIDER_SITE_OTHER): Payer: PPO | Admitting: *Deleted

## 2019-12-19 DIAGNOSIS — I255 Ischemic cardiomyopathy: Secondary | ICD-10-CM | POA: Diagnosis not present

## 2019-12-21 LAB — CUP PACEART REMOTE DEVICE CHECK
Battery Remaining Longevity: 71 mo
Battery Remaining Percentage: 68 %
Battery Voltage: 2.95 V
Brady Statistic RV Percent Paced: 0 %
Date Time Interrogation Session: 20210716130410
HighPow Impedance: 78 Ohm
HighPow Impedance: 78 Ohm
Implantable Lead Implant Date: 20100527
Implantable Lead Implant Date: 20100527
Implantable Lead Location: 753859
Implantable Lead Location: 753860
Implantable Pulse Generator Implant Date: 20180202
Lead Channel Impedance Value: 410 Ohm
Lead Channel Pacing Threshold Amplitude: 1.25 V
Lead Channel Pacing Threshold Pulse Width: 0.5 ms
Lead Channel Sensing Intrinsic Amplitude: 11.8 mV
Lead Channel Setting Pacing Amplitude: 2.5 V
Lead Channel Setting Pacing Pulse Width: 0.5 ms
Lead Channel Setting Sensing Sensitivity: 0.5 mV
Pulse Gen Serial Number: 7402114

## 2019-12-23 NOTE — Progress Notes (Signed)
Remote ICD transmission.   

## 2020-03-09 ENCOUNTER — Other Ambulatory Visit: Payer: Self-pay | Admitting: Interventional Cardiology

## 2020-03-19 ENCOUNTER — Ambulatory Visit (INDEPENDENT_AMBULATORY_CARE_PROVIDER_SITE_OTHER): Payer: PPO

## 2020-03-19 DIAGNOSIS — I255 Ischemic cardiomyopathy: Secondary | ICD-10-CM

## 2020-03-20 LAB — CUP PACEART REMOTE DEVICE CHECK
Battery Remaining Longevity: 69 mo
Battery Remaining Percentage: 65 %
Battery Voltage: 2.96 V
Brady Statistic RV Percent Paced: 0 %
Date Time Interrogation Session: 20211015061018
HighPow Impedance: 74 Ohm
HighPow Impedance: 74 Ohm
Implantable Lead Implant Date: 20100527
Implantable Lead Implant Date: 20100527
Implantable Lead Location: 753859
Implantable Lead Location: 753860
Implantable Pulse Generator Implant Date: 20180202
Lead Channel Impedance Value: 430 Ohm
Lead Channel Pacing Threshold Amplitude: 1.25 V
Lead Channel Pacing Threshold Pulse Width: 0.5 ms
Lead Channel Sensing Intrinsic Amplitude: 11.7 mV
Lead Channel Setting Pacing Amplitude: 2.5 V
Lead Channel Setting Pacing Pulse Width: 0.5 ms
Lead Channel Setting Sensing Sensitivity: 0.5 mV
Pulse Gen Serial Number: 7402114

## 2020-03-24 NOTE — Progress Notes (Signed)
Remote ICD transmission.   

## 2020-04-30 ENCOUNTER — Other Ambulatory Visit: Payer: Self-pay | Admitting: Interventional Cardiology

## 2020-05-04 ENCOUNTER — Other Ambulatory Visit: Payer: Self-pay | Admitting: Internal Medicine

## 2020-05-09 NOTE — Progress Notes (Signed)
Cardiology Office Note   Date:  05/10/2020   ID:  Mike, Cole 26-Sep-1950, MRN [id number]  PCP:  Patient, No Pcp Per    No chief complaint on file.  CAD/Old MI  Wt Readings from Last 3 Encounters:  05/10/20 215 lb (97.5 kg)  10/13/19 212 lb (96.2 kg)  05/05/19 215 lb 1.9 oz (97.6 kg)       History of Present Illness: Mike Cole is a 69 y.o. male  who had an anterior MI In March 3570 complicated by cardiac arrest and subsequent ischemic cardiomyopathy.He had an AICD placed.Last EF was35-40% in11/17.  He had a generator changeouta few years ago,and felt poorly after this. It was difficult to walk. He had his meds adjusted and he felt better, after Dr. Olin Pia visit. All of his sx have resolved since then.  In 2018, we recommended: "He needsto find a primary care physician. A list of primary care physicians in the community was given today. We talked about the need for screening for colon cancer, prostate cancer as well as recommendations for pneumonia shot,flu shot among others."   He joined a gym in 2018. He did the treadmill and elliptical 5 days a week and lifted weights on the machines.  With COVID, gym closed in 2020 and exercise decreased.    Has not gone back to the gym.  Does some walking but not regularly.    Denies : Chest pain. Dizziness. Leg edema. Nitroglycerin use. Orthopnea. Palpitations. Paroxysmal nocturnal dyspnea. Shortness of breath. Syncope.   He still does not have a PMD.    He did not get a COVID vaccine.  He wants a flu shot.     Past Medical History:  Diagnosis Date  . Acute anterior myocardial infarction (HCC)    BMS to the LAD  . Cardiogenic shock ([place])    resolved  . Dyslipidemia   . History of transient ischemic attack   . Ischemic cardiomyopathy    EF of 20-25%  . Ventricular tachycardia ([place])    arrest, resuscitated    Past Surgical History:  Procedure Laterality Date  . EP IMPLANTABLE DEVICE  N/A 07/07/2016   Procedure: ICD Generator Changeout;  Surgeon: Deboraha Sprang, MD;  Location: [facility];  Service: Cardiovascular;  Laterality: N/A;  . St Jude CURRENT + DR  10/30/2008     Current Outpatient Medications  Medication Sig Dispense Refill  . aspirin EC 81 MG tablet Take 81 mg by mouth daily.    [name] atorvastatin (LIPITOR) 40 MG tablet TAKE 1 TABLET BY MOUTH EVERY DAY AT 6PM 90 tablet 3  . benazepril (LOTENSIN) 40 MG tablet Take 0.5 tablets (20 mg total) by mouth daily. 45 tablet 3  . carvedilol (COREG) 3.125 MG tablet TAKE 1 TABLET BY MOUTH TWICE A DAY WITH MEALS NEEDS APPT FOR REFILLS 180 tablet 0  . furosemide (LASIX) 20 MG tablet Take 1 tablet (20 mg total) by mouth as directed. Takes 5 days a week 30 tablet 11  . Lecith-Inosi-Chol-B12-Liver (LIVERITE) 1.125 MCG TABS Take 1.125 mcg by mouth 2 (two) times daily.    . potassium chloride (MICRO-K) 10 MEQ CR capsule TAKE 1 CAPSULE BY MOUTH EVERY DAY NEEDS APPT FOR REFILLS 90 capsule 0  . spironolactone (ALDACTONE) 25 MG tablet TAKE 1/2 TABLET BY MOUTH EVERY DAY 45 tablet 1   No current facility-administered medications for this visit.    Allergies:   Patient has no known allergies.  Social History:  The patient  reports that he quit smoking about 11 years ago. His smoking use included cigarettes. He has never used smokeless tobacco. He reports current alcohol use. He reports that he does not use drugs.   Family History:  The patient's family history includes CAD in his brother, brother, father, mother, and sister; Diabetes in his father; Heart disease in his brother.    ROS:  Please see the history of present illness.   Otherwise, review of systems are positive for joint pains.   All other systems are reviewed and negative.    PHYSICAL EXAM: VS:  BP 128/72   Pulse 69   Ht 6\' 2"  (1.88 m)   Wt 215 lb (97.5 kg)   SpO2 97%   BMI 27.60 kg/m  , BMI Body mass index is 27.6 kg/m. GEN: Well nourished, well developed,  in no acute distress  HEENT: normal  Neck: no JVD, ; 2/6 right carotid bruit, no masses Cardiac: RRR; no murmurs, rubs, or gallops,no edema  Respiratory:  clear to auscultation bilaterally, normal work of breathing GI: soft, nontender, nondistended, + BS MS: no deformity or atrophy  Skin: warm and dry, no rash Neuro:  Strength and sensation are intact Psych: euthymic mood, full affect   EKG:   The ekg ordered 5/21 demonstrates sinus bradycardia, PRWP   Recent Labs: 10/13/2019: BUN 13; Creatinine, Ser 1.16; Potassium 4.3; Sodium 142   Lipid Panel    Component Value Date/Time   CHOL 108 07/24/2018 0802   TRIG 60 07/24/2018 0802   HDL 49 07/24/2018 0802   CHOLHDL 2.2 07/24/2018 0802   CHOLHDL 2.4 02/14/2016 0922   VLDL 13 02/14/2016 0922   LDLCALC 47 07/24/2018 0802     Other studies Reviewed: Additional studies/ records that were reviewed today with results demonstrating: 2020 labs reviewed.   ASSESSMENT AND PLAN:  1. CAD/Old MI: Continue aggressive secondary prevention.  No angina on medical therapy. 2. Hyperlipidemia: Whole food, plant-based diet recommended.  Increase exercise. Lipids today. COntinue high intesity statoin.  3. HTN: Low-salt diet.  Regular exercise with goals below.  The current medical regimen is effective;  continue present plan and medications. 4. Chronic systolic heart failure: appears euvolemic. Continue medical therapy.  AICD in place given LV dysfunction and prior ventricular tachycardia.  He is followed by Dr. Caryl Comes. 5. Refer to Dr. Felipa Eth for PMD. 6. Right carotid bruit: plan for carotid Doppler.   Current medicines are reviewed at length with the patient today.  The patient concerns regarding his medicines were addressed.  The following changes have been made:  No change  Labs/ tests ordered today include:  No orders of the defined types were placed in this encounter.   Recommend 150 minutes/week of aerobic exercise Low fat, low carb,  high fiber diet recommended  Disposition:   FU in 1 year   Signed, Larae Grooms, MD  05/10/2020 8:34 AM    [facility] [place], [place], [place]  [address] Phone: [phone]; Fax: [phone]

## 2020-05-10 ENCOUNTER — Other Ambulatory Visit: Payer: Self-pay

## 2020-05-10 ENCOUNTER — Ambulatory Visit: Payer: PPO | Admitting: Interventional Cardiology

## 2020-05-10 ENCOUNTER — Encounter: Payer: Self-pay | Admitting: Interventional Cardiology

## 2020-05-10 VITALS — BP 128/72 | HR 69 | Ht 74.0 in | Wt 215.0 lb

## 2020-05-10 DIAGNOSIS — E782 Mixed hyperlipidemia: Secondary | ICD-10-CM

## 2020-05-10 DIAGNOSIS — R0989 Other specified symptoms and signs involving the circulatory and respiratory systems: Secondary | ICD-10-CM | POA: Diagnosis not present

## 2020-05-10 DIAGNOSIS — Z95810 Presence of automatic (implantable) cardiac defibrillator: Secondary | ICD-10-CM | POA: Diagnosis not present

## 2020-05-10 DIAGNOSIS — I5022 Chronic systolic (congestive) heart failure: Secondary | ICD-10-CM

## 2020-05-10 DIAGNOSIS — I10 Essential (primary) hypertension: Secondary | ICD-10-CM | POA: Diagnosis not present

## 2020-05-10 DIAGNOSIS — I2510 Atherosclerotic heart disease of native coronary artery without angina pectoris: Secondary | ICD-10-CM | POA: Diagnosis not present

## 2020-05-10 NOTE — Patient Instructions (Signed)
Medication Instructions:  Your physician recommends that you continue on your current medications as directed. Please refer to the Current Medication list given to you today.  *If you need a refill on your cardiac medications before your next appointment, please call your pharmacy*   Lab Work: TODAY: CBC, CMET, LIPIDS  If you have labs (blood work) drawn today and your tests are completely normal, you will receive your results only by:  [place] (if you have MyChart) OR  A paper copy in the mail If you have any lab test that is abnormal or we need to change your treatment, we will call you to review the results.   Testing/Procedures: Your physician has requested that you have a carotid duplex. This test is an ultrasound of the carotid arteries in your neck. It looks at blood flow through these arteries that supply the brain with blood. Allow one hour for this exam. There are no restrictions or special instructions.  Follow-Up: At [facility], you and your health needs are our priority.  As part of our continuing mission to provide you with exceptional heart care, we have created designated Provider Care Teams.  These Care Teams include your primary Cardiologist (physician) and Advanced Practice Providers (APPs -  Physician Assistants and Nurse Practitioners) who all work together to provide you with the care you need, when you need it.  We recommend signing up for the patient portal called "MyChart".  Sign up information is provided on this After Visit Summary.  MyChart is used to connect with patients for Virtual Visits (Telemedicine).  Patients are able to view lab/test results, encounter notes, upcoming appointments, etc.  Non-urgent messages can be sent to your provider as well.   To learn more about what you can do with MyChart, go to [email or web address].    Your next appointment:   12 month(s)  The format for your next appointment:   In Person  Provider:   You may  see Larae Grooms, MD or one of the following Advanced Practice Providers on your designated Care Team:    Melina Copa, PA-C  Ermalinda Barrios, PA-C    Other Instructions  High-Fiber Diet Fiber, also called dietary fiber, is a type of carbohydrate that is found in fruits, vegetables, whole grains, and beans. A high-fiber diet can have many health benefits. Your health care provider may recommend a high-fiber diet to help:  Prevent constipation. Fiber can make your bowel movements more regular.  Lower your cholesterol.  Relieve the following conditions: ? Swelling of veins in the anus (hemorrhoids). ? Swelling and irritation (inflammation) of specific areas of the digestive tract (uncomplicated diverticulosis). ? A problem of the large intestine (colon) that sometimes causes pain and diarrhea (irritable bowel syndrome, IBS).  Prevent overeating as part of a weight-loss plan.  Prevent heart disease, type 2 diabetes, and certain cancers. What is my plan? The recommended daily fiber intake in grams (g) includes:  38 g for men age 11 or younger.  30 g for men over age 46.  66 g for women age 64 or younger.  21 g for women over age 58. You can get the recommended daily intake of dietary fiber by:  Eating a variety of fruits, vegetables, grains, and beans.  Taking a fiber supplement, if it is not possible to get enough fiber through your diet. What do I need to know about a high-fiber diet?  It is better to get fiber through food sources rather than from fiber supplements.  There is not a lot of research about how effective supplements are.  Always check the fiber content on the nutrition facts label of any prepackaged food. Look for foods that contain 5 g of fiber or more per serving.  Talk with a diet and nutrition specialist (dietitian) if you have questions about specific foods that are recommended or not recommended for your medical condition, especially if those foods are  not listed below.  Gradually increase how much fiber you consume. If you increase your intake of dietary fiber too quickly, you may have bloating, cramping, or gas.  Drink plenty of water. Water helps you to digest fiber. What are tips for following this plan?  Eat a wide variety of high-fiber foods.  Make sure that half of the grains that you eat each day are whole grains.  Eat breads and cereals that are made with whole-grain flour instead of refined flour or white flour.  Eat brown rice, bulgur wheat, or millet instead of white rice.  Start the day with a breakfast that is high in fiber, such as a cereal that contains 5 g of fiber or more per serving.  Use beans in place of meat in soups, salads, and pasta dishes.  Eat high-fiber snacks, such as berries, raw vegetables, nuts, and popcorn.  Choose whole fruits and vegetables instead of processed forms like juice or sauce. What foods can I eat?  Fruits Berries. Pears. Apples. Oranges. Avocado. Prunes and raisins. Dried figs. Vegetables Sweet potatoes. Spinach. Kale. Artichokes. Cabbage. Broccoli. Cauliflower. Green peas. Carrots. Squash. Grains Whole-grain breads. Multigrain cereal. Oats and oatmeal. Brown rice. Barley. Bulgur wheat. [place]. Quinoa. Bran muffins. Popcorn. Rye wafer crackers. Meats and other proteins Navy, kidney, and pinto beans. Soybeans. Split peas. Lentils. Nuts and seeds. Dairy Fiber-fortified yogurt. Beverages Fiber-fortified soy milk. Fiber-fortified orange juice. Other foods Fiber bars. The items listed above may not be a complete list of recommended foods and beverages. Contact a dietitian for more options. What foods are not recommended? Fruits Fruit juice. Cooked, strained fruit. Vegetables Fried potatoes. Canned vegetables. Well-cooked vegetables. Grains White bread. Pasta made with refined flour. White rice. Meats and other proteins Fatty cuts of meat. Fried chicken or fried  fish. Dairy Milk. Yogurt. Cream cheese. Sour cream. Fats and oils Butters. Beverages Soft drinks. Other foods Cakes and pastries. The items listed above may not be a complete list of foods and beverages to avoid. Contact a dietitian for more information. Summary  Fiber is a type of carbohydrate. It is found in fruits, vegetables, whole grains, and beans.  There are many health benefits of eating a high-fiber diet, such as preventing constipation, lowering blood cholesterol, helping with weight loss, and reducing your risk of heart disease, diabetes, and certain cancers.  Gradually increase your intake of fiber. Increasing too fast can result in cramping, bloating, and gas. Drink plenty of water while you increase your fiber.  The best sources of fiber include whole fruits and vegetables, whole grains, nuts, seeds, and beans. This information is not intended to replace advice given to you by your health care provider. Make sure you discuss any questions you have with your health care provider. Document Revised: 03/26/2017 Document Reviewed: 03/26/2017 Elsevier Patient Education  2020 [employer].

## 2020-05-11 LAB — COMPREHENSIVE METABOLIC PANEL
ALT: 19 IU/L (ref 0–44)
AST: 21 IU/L (ref 0–40)
Albumin/Globulin Ratio: 2 (ref 1.2–2.2)
Albumin: 4.3 g/dL (ref 3.8–4.8)
Alkaline Phosphatase: 121 IU/L (ref 44–121)
BUN/Creatinine Ratio: 8 — ABNORMAL LOW (ref 10–24)
BUN: 9 mg/dL (ref 8–27)
Bilirubin Total: 0.8 mg/dL (ref 0.0–1.2)
CO2: 22 mmol/L (ref 20–29)
Calcium: 8.8 mg/dL (ref 8.6–10.2)
Chloride: 105 mmol/L (ref 96–106)
Creatinine, Ser: 1.07 mg/dL (ref 0.76–1.27)
GFR calc Af Amer: 82 mL/min/{1.73_m2} (ref >59)
GFR calc non Af Amer: 71 mL/min/{1.73_m2} (ref >59)
Globulin, Total: 2.1 g/dL (ref 1.5–4.5)
Glucose: 100 mg/dL — ABNORMAL HIGH (ref 65–99)
Potassium: 4.8 mmol/L (ref 3.5–5.2)
Sodium: 140 mmol/L (ref 134–144)
Total Protein: 6.4 g/dL (ref 6.0–8.5)

## 2020-05-11 LAB — LIPID PANEL
Chol/HDL Ratio: 2.2 ratio (ref 0.0–5.0)
Cholesterol, Total: 103 mg/dL (ref 100–199)
HDL: 47 mg/dL (ref >39)
LDL Chol Calc (NIH): 43 mg/dL (ref 0–99)
Triglycerides: 54 mg/dL (ref 0–149)
VLDL Cholesterol Cal: 13 mg/dL (ref 5–40)

## 2020-05-11 LAB — CBC
Hematocrit: 39.9 % (ref 37.5–51.0)
Hemoglobin: 13.5 g/dL (ref 13.0–17.7)
MCH: 32.5 pg (ref 26.6–33.0)
MCHC: 33.8 g/dL (ref 31.5–35.7)
MCV: 96 fL (ref 79–97)
Platelets: 150 10*3/uL (ref 150–450)
RBC: 4.15 x10E6/uL (ref 4.14–5.80)
RDW: 12.6 % (ref 11.6–15.4)
WBC: 3.8 10*3/uL (ref 3.4–10.8)

## 2020-05-25 ENCOUNTER — Other Ambulatory Visit: Payer: Self-pay

## 2020-05-25 ENCOUNTER — Ambulatory Visit (HOSPITAL_COMMUNITY)
Admission: RE | Admit: 2020-05-25 | Discharge: 2020-05-25 | Disposition: A | Discharge status: Home / Self Care | Payer: PPO | Source: Ambulatory Visit | Attending: Cardiovascular Disease | Admitting: Cardiovascular Disease

## 2020-05-25 DIAGNOSIS — R0989 Other specified symptoms and signs involving the circulatory and respiratory systems: Secondary | ICD-10-CM | POA: Diagnosis not present

## 2020-06-18 ENCOUNTER — Ambulatory Visit (INDEPENDENT_AMBULATORY_CARE_PROVIDER_SITE_OTHER): Payer: PPO

## 2020-06-18 DIAGNOSIS — I5022 Chronic systolic (congestive) heart failure: Secondary | ICD-10-CM | POA: Diagnosis not present

## 2020-06-18 DIAGNOSIS — I255 Ischemic cardiomyopathy: Secondary | ICD-10-CM

## 2020-06-21 ENCOUNTER — Other Ambulatory Visit: Payer: Self-pay

## 2020-06-21 LAB — CUP PACEART REMOTE DEVICE CHECK
Battery Remaining Longevity: 67 mo
Battery Remaining Percentage: 64 %
Battery Voltage: 2.95 V
Brady Statistic RV Percent Paced: 0 %
Date Time Interrogation Session: 20220114020016
HighPow Impedance: 78 Ohm
HighPow Impedance: 78 Ohm
Implantable Lead Implant Date: 20100527
Implantable Lead Implant Date: 20100527
Implantable Lead Location: 753859
Implantable Lead Location: 753860
Implantable Pulse Generator Implant Date: 20180202
Lead Channel Impedance Value: 380 Ohm
Lead Channel Pacing Threshold Amplitude: 1.25 V
Lead Channel Pacing Threshold Pulse Width: 0.5 ms
Lead Channel Sensing Intrinsic Amplitude: 11.7 mV
Lead Channel Setting Pacing Amplitude: 2.5 V
Lead Channel Setting Pacing Pulse Width: 0.5 ms
Lead Channel Setting Sensing Sensitivity: 0.5 mV
Pulse Gen Serial Number: 7402114

## 2020-06-21 MED ORDER — BENAZEPRIL HCL 40 MG PO TABS: 20.0000 mg | ORAL_TABLET | Freq: Every day | ORAL | 2 refills | Status: DC

## 2020-07-02 NOTE — Progress Notes (Signed)
Remote ICD transmission.   

## 2020-08-16 ENCOUNTER — Other Ambulatory Visit: Payer: Self-pay | Admitting: Interventional Cardiology

## 2020-09-13 ENCOUNTER — Other Ambulatory Visit: Payer: Self-pay | Admitting: Interventional Cardiology

## 2020-09-15 ENCOUNTER — Other Ambulatory Visit: Payer: Self-pay

## 2020-09-15 DIAGNOSIS — M79622 Pain in left upper arm: Secondary | ICD-10-CM | POA: Diagnosis not present

## 2020-09-15 DIAGNOSIS — T148XXA Other injury of unspecified body region, initial encounter: Secondary | ICD-10-CM | POA: Diagnosis not present

## 2020-09-15 DIAGNOSIS — I2510 Atherosclerotic heart disease of native coronary artery without angina pectoris: Secondary | ICD-10-CM | POA: Diagnosis not present

## 2020-09-15 DIAGNOSIS — M542 Cervicalgia: Secondary | ICD-10-CM | POA: Diagnosis not present

## 2020-09-15 DIAGNOSIS — Z95810 Presence of automatic (implantable) cardiac defibrillator: Secondary | ICD-10-CM | POA: Diagnosis not present

## 2020-09-15 DIAGNOSIS — I429 Cardiomyopathy, unspecified: Secondary | ICD-10-CM | POA: Diagnosis not present

## 2020-09-15 DIAGNOSIS — L409 Psoriasis, unspecified: Secondary | ICD-10-CM | POA: Diagnosis not present

## 2020-09-15 DIAGNOSIS — E782 Mixed hyperlipidemia: Secondary | ICD-10-CM | POA: Diagnosis not present

## 2020-09-15 MED ORDER — SPIRONOLACTONE 25 MG PO TABS: 0.5000 | ORAL_TABLET | Freq: Every day | ORAL | 0 refills | Status: DC

## 2020-09-16 ENCOUNTER — Other Ambulatory Visit: Payer: Self-pay

## 2020-09-16 MED ORDER — FUROSEMIDE 20 MG PO TABS: 20.0000 mg | ORAL_TABLET | ORAL | 1 refills | Status: DC

## 2020-09-17 ENCOUNTER — Ambulatory Visit (INDEPENDENT_AMBULATORY_CARE_PROVIDER_SITE_OTHER): Payer: PPO

## 2020-09-17 DIAGNOSIS — I255 Ischemic cardiomyopathy: Secondary | ICD-10-CM

## 2020-09-21 LAB — CUP PACEART REMOTE DEVICE CHECK
Battery Remaining Longevity: 64 mo
Battery Remaining Percentage: 61 %
Battery Voltage: 2.96 V
Brady Statistic RV Percent Paced: 0 %
Date Time Interrogation Session: 20220415020016
HighPow Impedance: 73 Ohm
HighPow Impedance: 73 Ohm
Implantable Lead Implant Date: 20100527
Implantable Lead Implant Date: 20100527
Implantable Lead Location: 753859
Implantable Lead Location: 753860
Implantable Pulse Generator Implant Date: 20180202
Lead Channel Impedance Value: 410 Ohm
Lead Channel Pacing Threshold Amplitude: 1.25 V
Lead Channel Pacing Threshold Pulse Width: 0.5 ms
Lead Channel Sensing Intrinsic Amplitude: 11.7 mV
Lead Channel Setting Pacing Amplitude: 2.5 V
Lead Channel Setting Pacing Pulse Width: 0.5 ms
Lead Channel Setting Sensing Sensitivity: 0.5 mV
Pulse Gen Serial Number: 7402114

## 2020-10-05 NOTE — Progress Notes (Signed)
Remote ICD transmission.   

## 2020-10-08 ENCOUNTER — Other Ambulatory Visit: Payer: Self-pay | Admitting: Internal Medicine

## 2020-10-31 ENCOUNTER — Other Ambulatory Visit: Payer: Self-pay | Admitting: Internal Medicine

## 2020-11-22 ENCOUNTER — Ambulatory Visit: Payer: PPO | Admitting: Internal Medicine

## 2020-11-22 ENCOUNTER — Other Ambulatory Visit: Payer: Self-pay

## 2020-11-22 ENCOUNTER — Encounter: Payer: Self-pay | Admitting: Internal Medicine

## 2020-11-22 VITALS — BP 122/60 | HR 52 | Ht 74.0 in | Wt 214.2 lb

## 2020-11-22 DIAGNOSIS — I472 Ventricular tachycardia, unspecified: Secondary | ICD-10-CM

## 2020-11-22 DIAGNOSIS — R001 Bradycardia, unspecified: Secondary | ICD-10-CM

## 2020-11-22 DIAGNOSIS — I255 Ischemic cardiomyopathy: Secondary | ICD-10-CM

## 2020-11-22 DIAGNOSIS — Z79899 Other long term (current) drug therapy: Secondary | ICD-10-CM | POA: Diagnosis not present

## 2020-11-22 DIAGNOSIS — Z95810 Presence of automatic (implantable) cardiac defibrillator: Secondary | ICD-10-CM | POA: Diagnosis not present

## 2020-11-22 LAB — BASIC METABOLIC PANEL
BUN/Creatinine Ratio: 13 (ref 10–24)
BUN: 15 mg/dL (ref 8–27)
CO2: 22 mmol/L (ref 20–29)
Calcium: 9 mg/dL (ref 8.6–10.2)
Chloride: 106 mmol/L (ref 96–106)
Creatinine, Ser: 1.13 mg/dL (ref 0.76–1.27)
Glucose: 93 mg/dL (ref 65–99)
Potassium: 4.6 mmol/L (ref 3.5–5.2)
Sodium: 140 mmol/L (ref 134–144)
eGFR: 70 mL/min/{1.73_m2} (ref >59)

## 2020-11-22 NOTE — Progress Notes (Signed)
Patient ID: Mike Cole, male   DOB: Jan 08, 1951, 70 y.o.   MRN: [id number] Patient Care Team: Patient, No Pcp Per (Inactive) as PCP - General ([place]) Jettie Booze, MD as PCP - Cardiology (Cardiology)   HPI  Mike Cole is a 70 y.o. male seen in followup for ventricular tachycardia/fibrillation presented as an out of hospital cardiac arrest. He  underwent intervention of an occluded LAD but had persistent left ventricular dysfunction prompting dual chamber ICD implantation St Jude/  Generator replacement 2/18.  He has atrial lead failure (to capture) prompting reprogramming of the device DDD-VVI lower rate of 40, this to avoid ventricular pacing  Covid infection   Today, the patient denies chest pain, shortness of breath, nocturnal dyspnea, orthopnea or peripheral edema.  There have been no palpitations, lightheadedness or syncope.  Since last visit he have been doing well  He notes being slightly physically active by taking walk with his wife at the park on the trails and doing yard chores (mowing)   DATE TEST EF   5/10 Echo 30-35 %   11/17 Echo 35-40 %   6/19 Echo  35-40% Anterior AK            Date Cr K Hgb LDL  1/18  1.18 4.6 13.2    6/18 1.23 4.4   53  11/19 1.19 4.8    10/20 1.08 4.8  (2/20) 47  12/21 1.07 4.8 13.5             Past Medical History:  Diagnosis Date   Acute anterior myocardial infarction (HCC)    BMS to the LAD   Cardiogenic shock ([place])    resolved   Dyslipidemia    History of transient ischemic attack    Ischemic cardiomyopathy    EF of 20-25%   Ventricular tachycardia ([place])    arrest, resuscitated    Past Surgical History:  Procedure Laterality Date   EP IMPLANTABLE DEVICE N/A 07/07/2016   Procedure: ICD Generator Changeout;  Surgeon: Deboraha Sprang, MD;  Location: [place] [facility];  Service: Cardiovascular;  Laterality: N/A;   St Jude CURRENT + DR  10/30/2008    Current Outpatient Medications  Medication Sig  Dispense Refill   aspirin EC 81 MG tablet Take 81 mg by mouth daily.     atorvastatin (LIPITOR) 40 MG tablet TAKE 1 TABLET BY MOUTH EVERY DAY AT 6PM 90 tablet 3   benazepril (LOTENSIN) 40 MG tablet Take 0.5 tablets (20 mg total) by mouth daily. 45 tablet 2   carvedilol (COREG) 3.125 MG tablet TAKE 1 TABLET BY MOUTH TWICE A DAY WITH MEALS NEEDS APPT FOR REFILLS 180 tablet 3   furosemide (LASIX) 20 MG tablet TAKE 1 TABLET BY MOUTH AS DIRECTED. TAKES 5 DAYS A WEEK. PLEASE KEEP UPCOMING APPT IN JUNE 2022 WITH DR. Caryl Comes BEFORE ANYMORE REFILLS. THANK YOU 30 tablet 1   Lecith-Inosi-Chol-B12-Liver (LIVERITE) 1.125 MCG TABS Take 1.125 mcg by mouth 2 (two) times daily.     potassium chloride (MICRO-K) 10 MEQ CR capsule TAKE 1 CAPSULE BY MOUTH EVERY DAY NEEDS APPT FOR REFILLS 90 capsule 3   spironolactone (ALDACTONE) 25 MG tablet Take 0.5 tablets (12.5 mg total) by mouth daily. Please make yearly appt with Dr. Caryl Comes for May 2022 for future refills. Thank you 1st attempt 45 tablet 0   No current facility-administered medications for this visit.    No Known Allergies  Review of Systems negative except from HPI and PMH  Physical Exam: BP 122/60   Pulse (!) 52   Ht 6\' 2"  (1.88 m)   Wt 214 lb 3.2 oz (97.2 kg)   SpO2 94%   BMI 27.50 kg/m  Well developed and well nourished in no acute distress HENT normal Neck supple with JVP-flat Lungs Clear Device pocket well healed; without hematoma or erythema.  There is no tethering  Regular rate and rhythm, no  gallop No murmur Abd-soft with active BS No Clubbing cyanosis  edema Skin-warm and dry A & Oriented  Grossly normal sensory and motor function  EKG: Sinus and 52 Intervals 19/09/43 Otherwise normal    Assessment and  Plan  Ischemic cardiomyopathy  Aborted Cardiac Arrest   Implantable defibrillator-St. Jude     Hyperlipidemia  Congestive heart failure-class II  Low voltage ECG   High Risk Medication Surveillance Aldactone  Atrial  lead failure  Sinus node dysfunction   No interval ventricular arrhythmias continue ICD therapy and carvedilol 3.125 dose up titration limited by bradycardia; heart rate excursion is adequate  Left ventricular dysfunction has been persistent.  Be appropriate to transition from lisinopril to [facility].  Would anticipate a dose of 24/26 but will reach out to Dr. Saundra Shelling as there is any questions about functional status in the past. No continue his Aldactone at 12.5, Lotensin at 20 mg.  Dyslipidemia with his LDL at target.  We will continue his Lipitor 40 mg daily.  Pt heart failure status is stable. Continue furosemide 20 mg 5 days a week electrolytes are stable.        I,Stephanie Williams,acting as a Education administrator for Virl Axe, MD.,have documented all relevant documentation on the behalf of Virl Axe, MD,as directed by  Virl Axe, MD while in the presence of Virl Axe, MD.  I, Virl Axe, MD, have reviewed all documentation for this visit. The documentation on 11/22/20 for the exam, diagnosis, procedures, and orders are all accurate and complete.

## 2020-11-22 NOTE — Patient Instructions (Signed)
Medication Instructions:  Your physician recommends that you continue on your current medications as directed. Please refer to the Current Medication list given to you today.  *If you need a refill on your cardiac medications before your next appointment, please call your pharmacy*   Lab Work: BMET today If you have labs (blood work) drawn today and your tests are completely normal, you will receive your results only by: [place] (if you have MyChart) OR A paper copy in the mail If you have any lab test that is abnormal or we need to change your treatment, we will call you to review the results.   Testing/Procedures: None ordered.    Follow-Up: At [facility], you and your health needs are our priority.  As part of our continuing mission to provide you with exceptional heart care, we have created designated Provider Care Teams.  These Care Teams include your primary Cardiologist (physician) and Advanced Practice Providers (APPs -  Physician Assistants and Nurse Practitioners) who all work together to provide you with the care you need, when you need it.  We recommend signing up for the patient portal called "MyChart".  Sign up information is provided on this After Visit Summary.  MyChart is used to connect with patients for Virtual Visits (Telemedicine).  Patients are able to view lab/test results, encounter notes, upcoming appointments, etc.  Non-urgent messages can be sent to your provider as well.   To learn more about what you can do with MyChart, go to [email or web address].    Your next appointment:   12 year(s)  The format for your next appointment:   In Person  Provider:   Virl Axe, MD

## 2020-11-26 ENCOUNTER — Other Ambulatory Visit: Payer: Self-pay | Admitting: Internal Medicine

## 2020-11-30 ENCOUNTER — Other Ambulatory Visit: Payer: Self-pay | Admitting: Interventional Cardiology

## 2020-12-17 ENCOUNTER — Ambulatory Visit (INDEPENDENT_AMBULATORY_CARE_PROVIDER_SITE_OTHER): Payer: PPO

## 2020-12-17 DIAGNOSIS — I255 Ischemic cardiomyopathy: Secondary | ICD-10-CM

## 2020-12-20 LAB — CUP PACEART REMOTE DEVICE CHECK
Battery Remaining Longevity: 62 mo
Battery Remaining Percentage: 60 %
Battery Voltage: 2.95 V
Brady Statistic RV Percent Paced: 0 %
Date Time Interrogation Session: 20220715020020
HighPow Impedance: 81 Ohm
HighPow Impedance: 81 Ohm
Implantable Lead Implant Date: 20100527
Implantable Lead Implant Date: 20100527
Implantable Lead Location: 753859
Implantable Lead Location: 753860
Implantable Pulse Generator Implant Date: 20180202
Lead Channel Impedance Value: 430 Ohm
Lead Channel Pacing Threshold Amplitude: 1.25 V
Lead Channel Pacing Threshold Pulse Width: 0.6 ms
Lead Channel Sensing Intrinsic Amplitude: 11.7 mV
Lead Channel Setting Pacing Amplitude: 2.5 V
Lead Channel Setting Pacing Pulse Width: 0.6 ms
Lead Channel Setting Sensing Sensitivity: 0.5 mV
Pulse Gen Serial Number: 7402114

## 2021-01-10 ENCOUNTER — Other Ambulatory Visit: Payer: Self-pay | Admitting: *Deleted

## 2021-01-10 MED ORDER — CARVEDILOL 3.125 MG PO TABS: ORAL_TABLET | ORAL | 3 refills | Status: DC

## 2021-01-11 NOTE — Progress Notes (Signed)
Remote ICD transmission.   

## 2021-01-12 ENCOUNTER — Other Ambulatory Visit: Payer: Self-pay | Admitting: *Deleted

## 2021-01-12 MED ORDER — SPIRONOLACTONE 25 MG PO TABS: 12.5000 mg | ORAL_TABLET | Freq: Every day | ORAL | 3 refills | Status: DC

## 2021-02-24 DIAGNOSIS — L821 Other seborrheic keratosis: Secondary | ICD-10-CM | POA: Diagnosis not present

## 2021-02-24 DIAGNOSIS — D485 Neoplasm of uncertain behavior of skin: Secondary | ICD-10-CM | POA: Diagnosis not present

## 2021-02-24 DIAGNOSIS — D0462 Carcinoma in situ of skin of left upper limb, including shoulder: Secondary | ICD-10-CM | POA: Diagnosis not present

## 2021-02-24 DIAGNOSIS — L570 Actinic keratosis: Secondary | ICD-10-CM | POA: Diagnosis not present

## 2021-02-24 DIAGNOSIS — L820 Inflamed seborrheic keratosis: Secondary | ICD-10-CM | POA: Diagnosis not present

## 2021-02-24 DIAGNOSIS — D1801 Hemangioma of skin and subcutaneous tissue: Secondary | ICD-10-CM | POA: Diagnosis not present

## 2021-02-24 DIAGNOSIS — L812 Freckles: Secondary | ICD-10-CM | POA: Diagnosis not present

## 2021-03-14 ENCOUNTER — Other Ambulatory Visit: Payer: Self-pay | Admitting: *Deleted

## 2021-03-14 MED ORDER — BENAZEPRIL HCL 40 MG PO TABS: 20.0000 mg | ORAL_TABLET | Freq: Every day | ORAL | 2 refills | Status: DC

## 2021-03-18 ENCOUNTER — Ambulatory Visit (INDEPENDENT_AMBULATORY_CARE_PROVIDER_SITE_OTHER): Payer: PPO

## 2021-03-18 DIAGNOSIS — E782 Mixed hyperlipidemia: Secondary | ICD-10-CM | POA: Diagnosis not present

## 2021-03-18 DIAGNOSIS — I429 Cardiomyopathy, unspecified: Secondary | ICD-10-CM | POA: Diagnosis not present

## 2021-03-18 DIAGNOSIS — I255 Ischemic cardiomyopathy: Secondary | ICD-10-CM | POA: Diagnosis not present

## 2021-03-18 DIAGNOSIS — L409 Psoriasis, unspecified: Secondary | ICD-10-CM | POA: Diagnosis not present

## 2021-03-18 DIAGNOSIS — Z95810 Presence of automatic (implantable) cardiac defibrillator: Secondary | ICD-10-CM | POA: Diagnosis not present

## 2021-03-18 DIAGNOSIS — Z125 Encounter for screening for malignant neoplasm of prostate: Secondary | ICD-10-CM | POA: Diagnosis not present

## 2021-03-18 DIAGNOSIS — I10 Essential (primary) hypertension: Secondary | ICD-10-CM | POA: Diagnosis not present

## 2021-03-18 DIAGNOSIS — Z23 Encounter for immunization: Secondary | ICD-10-CM | POA: Diagnosis not present

## 2021-03-18 DIAGNOSIS — Z0000 Encounter for general adult medical examination without abnormal findings: Secondary | ICD-10-CM | POA: Diagnosis not present

## 2021-03-18 DIAGNOSIS — I2510 Atherosclerotic heart disease of native coronary artery without angina pectoris: Secondary | ICD-10-CM | POA: Diagnosis not present

## 2021-03-18 DIAGNOSIS — Z1389 Encounter for screening for other disorder: Secondary | ICD-10-CM | POA: Diagnosis not present

## 2021-03-21 ENCOUNTER — Encounter: Payer: Self-pay | Admitting: Internal Medicine

## 2021-03-21 LAB — CUP PACEART REMOTE DEVICE CHECK
Battery Remaining Longevity: 59 mo
Battery Remaining Percentage: 57 %
Battery Voltage: 2.95 V
Brady Statistic RV Percent Paced: 1 %
Date Time Interrogation Session: 20221014075441
HighPow Impedance: 73 Ohm
HighPow Impedance: 78 Ohm
Implantable Lead Implant Date: 20100527
Implantable Lead Implant Date: 20100527
Implantable Lead Location: 753859
Implantable Lead Location: 753860
Implantable Pulse Generator Implant Date: 20180202
Lead Channel Impedance Value: 390 Ohm
Lead Channel Pacing Threshold Amplitude: 1.25 V
Lead Channel Pacing Threshold Pulse Width: 0.6 ms
Lead Channel Sensing Intrinsic Amplitude: 11.7 mV
Lead Channel Setting Pacing Amplitude: 2.5 V
Lead Channel Setting Pacing Pulse Width: 0.6 ms
Lead Channel Setting Sensing Sensitivity: 0.5 mV
Pulse Gen Serial Number: 7402114

## 2021-03-28 NOTE — Progress Notes (Signed)
Remote ICD transmission.   

## 2021-06-08 ENCOUNTER — Other Ambulatory Visit: Payer: Self-pay

## 2021-06-08 ENCOUNTER — Ambulatory Visit: Payer: PPO | Admitting: Interventional Cardiology

## 2021-06-08 ENCOUNTER — Encounter: Payer: Self-pay | Admitting: Interventional Cardiology

## 2021-06-08 VITALS — BP 138/60 | HR 64 | Ht 74.0 in | Wt 215.0 lb

## 2021-06-08 DIAGNOSIS — Z79899 Other long term (current) drug therapy: Secondary | ICD-10-CM | POA: Diagnosis not present

## 2021-06-08 DIAGNOSIS — I255 Ischemic cardiomyopathy: Secondary | ICD-10-CM

## 2021-06-08 MED ORDER — ENTRESTO 24-26 MG PO TABS: 1.0000 | ORAL_TABLET | Freq: Two times a day (BID) | ORAL | 6 refills | Status: DC

## 2021-06-08 NOTE — Patient Instructions (Addendum)
Medication Instructions:  Your physician has recommended you make the following change in your medication: Stop Benazepril. Start Entresto 24/26 twice daily.  Start tomorrow.   *If you need a refill on your cardiac medications before your next appointment, please call your pharmacy*   Lab Work: Your physician recommends that you return for lab work in: 1-2 weeks.  BMP.  This is not fasting  If you have labs (blood work) drawn today and your tests are completely normal, you will receive your results only by: [place] (if you have MyChart) OR A paper copy in the mail If you have any lab test that is abnormal or we need to change your treatment, we will call you to review the results.   Testing/Procedures: none   Follow-Up: At [facility], you and your health needs are our priority.  As part of our continuing mission to provide you with exceptional heart care, we have created designated Provider Care Teams.  These Care Teams include your primary Cardiologist (physician) and Advanced Practice Providers (APPs -  Physician Assistants and Nurse Practitioners) who all work together to provide you with the care you need, when you need it.  We recommend signing up for the patient portal called "MyChart".  Sign up information is provided on this After Visit Summary.  MyChart is used to connect with patients for Virtual Visits (Telemedicine).  Patients are able to view lab/test results, encounter notes, upcoming appointments, etc.  Non-urgent messages can be sent to your provider as well.   To learn more about what you can do with MyChart, go to [email or web address].    Your next appointment:   12 month(s)  The format for your next appointment:   In Person  Provider:   Larae Grooms, MD     Other Instructions You have been referred to pharmacy clinic in our office.  Please schedule new patient appointment for 2-3 weeks from now  Check blood pressure at home and keep record  of readings.  Bring readings and blood pressure cuff to appointment with pharmacist

## 2021-06-08 NOTE — Progress Notes (Signed)
Cardiology Office Note   Date:  06/08/2021   ID:  Mike Cole, Mike Cole Oct 11, 1950, MRN [id number]  PCP:  Scheryl Marten, [place]    Chief Complaint  Patient presents with   Follow-up   CAD/Old MI  Wt Readings from Last 3 Encounters:  06/08/21 215 lb (97.5 kg)  11/22/20 214 lb 3.2 oz (97.2 kg)  05/10/20 215 lb (97.5 kg)       History of Present Illness: Mike Cole is a 71 y.o. male   who had an anterior MI  In March 9417 complicated by cardiac arrest and subsequent ischemic cardiomyopathy. He had an AICD placed.  Last EF was 35-40% in 11/17.     He had a generator changeout a few years ago, and felt poorly after this.  It was difficult to walk.  He had his meds adjusted and he felt better, after Dr. Olin Pia visit.  All of his sx have resolved since then.   In 2018, we recommended: "He needs to find a primary care physician. A list of primary care physicians in the community was given today. We talked about the need for screening for colon cancer, prostate cancer as well as recommendations for pneumonia shot, flu shot among others."      He joined a gym in 2018.  He does the treadmill and elliptical 5 days a week and lifts weights on the machines.   With COVID, gym closed.  Exercise has decreased.  He walks  a little.  He has been doing things around the house.   Echo in 2019: "Left ventricle: The cavity size was moderately dilated. Systolic    function was moderately reduced. The estimated ejection fraction    was in the range of 35% to 40%. Akinesis of the    mid-apicalanteroseptal and apical myocardium. Features are    consistent with a pseudonormal left ventricular filling pattern,    with concomitant abnormal relaxation and increased filling    pressure (grade 2 diastolic dysfunction).  - Mitral valve: There was mild regurgitation.  - Tricuspid valve: There was mild-moderate regurgitation."  He has done well.  He has had some fatigue.  He is trying to go back to  the gym after the holidays.     Past Medical History:  Diagnosis Date   Acute anterior myocardial infarction (HCC)    BMS to the LAD   Cardiogenic shock ([place])    resolved   Dyslipidemia    History of transient ischemic attack    Ischemic cardiomyopathy    EF of 20-25%   Ventricular tachycardia    arrest, resuscitated    Past Surgical History:  Procedure Laterality Date   EP IMPLANTABLE DEVICE N/A 07/07/2016   Procedure: ICD Generator Changeout;  Surgeon: Deboraha Sprang, MD;  Location: [place] [facility];  Service: Cardiovascular;  Laterality: N/A;   St Jude CURRENT + DR  10/30/2008     Current Outpatient Medications  Medication Sig Dispense Refill   aspirin EC 81 MG tablet Take 81 mg by mouth daily.     atorvastatin (LIPITOR) 40 MG tablet TAKE 1 TABLET BY MOUTH EVERY DAY AT 6PM 90 tablet 3   benazepril (LOTENSIN) 40 MG tablet Take 0.5 tablets (20 mg total) by mouth daily. 45 tablet 2   carvedilol (COREG) 3.125 MG tablet TAKE 1 TABLET BY MOUTH TWICE A DAY WITH MEALS NEEDS APPT FOR REFILLS 180 tablet 3   furosemide (LASIX) 20 MG tablet Take 1 tablet  by mouth as directed. Takes 5 days a week. 90 tablet 3   Lecith-Inosi-Chol-B12-Liver (LIVERITE) 1.125 MCG TABS Take 1.125 mcg by mouth 2 (two) times daily.     NON FORMULARY Relief Factor take 4 capsules bid     potassium chloride (MICRO-K) 10 MEQ CR capsule TAKE 1 CAPSULE BY MOUTH EVERY DAY NEEDS APPT FOR REFILLS 90 capsule 3   spironolactone (ALDACTONE) 25 MG tablet Take 0.5 tablets (12.5 mg total) by mouth daily. 45 tablet 3   No current facility-administered medications for this visit.    Allergies:   Patient has no known allergies.    Social History:  The patient  reports that he quit smoking about 13 years ago. His smoking use included cigarettes. He has never used smokeless tobacco. He reports current alcohol use. He reports that he does not use drugs.   Family History:  The patient's family history includes CAD in his  brother, brother, father, mother, and sister; Diabetes in his father; Heart disease in his brother.    ROS:  Please see the history of present illness.   Otherwise, review of systems are positive for fatigue.   All other systems are reviewed and negative.    PHYSICAL EXAM: VS:  BP 138/60    Pulse 64    Ht 6\' 2"  (1.88 m)    Wt 215 lb (97.5 kg)    SpO2 99%    BMI 27.60 kg/m  , BMI Body mass index is 27.6 kg/m. GEN: Well nourished, well developed, in no acute distress HEENT: normal Neck: no JVD, carotid bruits, or masses Cardiac: RRR; no murmurs, rubs, or gallops,no edema  Respiratory:  clear to auscultation bilaterally, normal work of breathing GI: soft, nontender, nondistended, + BS MS: no deformity or atrophy Skin: warm and dry, no rash Neuro:  Strength and sensation are intact Psych: euthymic mood, full affect   EKG:   The ekg ordered today demonstrates normal sinus rhythm, poor R wave progression   Recent Labs: 11/22/2020: BUN 15; Creatinine, Ser 1.13; Potassium 4.6; Sodium 140   Lipid Panel    Component Value Date/Time   CHOL 103 05/10/2020 0923   TRIG 54 05/10/2020 0923   HDL 47 05/10/2020 0923   CHOLHDL 2.2 05/10/2020 0923   CHOLHDL 2.4 02/14/2016 0922   VLDL 13 02/14/2016 0922   LDLCALC 43 05/10/2020 0923     Other studies Reviewed: Additional studies/ records that were reviewed today with results demonstrating: Labs reviewed.  Creatinine 1.12 in October 2022.  Potassium 4.8.   ASSESSMENT AND PLAN:  CAD/Old MI: No clear angina.  Has some days where he feels better than others. Hyperlipidemia: LDL 57 in October 2022.  Continue high-fiber, whole food, plant-based diet. Chronic systolic heart failure: Discussed changing benazepril to Entresto 24/26.  Low-salt diet.  He is willing to try the [facility].  He is not taking benazepril since last night.  He will take his first dose of Entresto tomorrow.  This will allow 36 hours between doses.  We will check electrolytes  in a week and have him follow-up with our Pharm.D.'s for further titration.  He will check his blood pressures at home and keep a list as well.  If fatigue does not improve, would consider Lexiscan Myoview   Current medicines are reviewed at length with the patient today.  The patient concerns regarding his medicines were addressed.  The following changes have been made: as above  Labs/ tests ordered today include: BMet No orders of the  defined types were placed in this encounter.   Recommend 150 minutes/week of aerobic exercise Low fat, low carb, high fiber diet recommended  Disposition:   FU in a few weeks with Pharm.D., 1 year with me.  He will also see electrophysiology.   Signed, Larae Grooms, MD  06/08/2021 9:15 AM    [place] [address], [place], [place]  [address] Phone: [phone]; Fax: [phone]

## 2021-06-09 ENCOUNTER — Encounter: Payer: Self-pay | Admitting: Interventional Cardiology

## 2021-06-17 ENCOUNTER — Ambulatory Visit (INDEPENDENT_AMBULATORY_CARE_PROVIDER_SITE_OTHER): Payer: PPO

## 2021-06-17 DIAGNOSIS — I255 Ischemic cardiomyopathy: Secondary | ICD-10-CM

## 2021-06-17 LAB — CUP PACEART REMOTE DEVICE CHECK
Battery Remaining Longevity: 58 mo
Battery Remaining Percentage: 56 %
Battery Voltage: 2.95 V
Brady Statistic RV Percent Paced: 1 %
Date Time Interrogation Session: 20230113020017
HighPow Impedance: 78 Ohm
HighPow Impedance: 78 Ohm
Implantable Lead Implant Date: 20100527
Implantable Lead Implant Date: 20100527
Implantable Lead Location: 753859
Implantable Lead Location: 753860
Implantable Pulse Generator Implant Date: 20180202
Lead Channel Impedance Value: 400 Ohm
Lead Channel Pacing Threshold Amplitude: 1.25 V
Lead Channel Pacing Threshold Pulse Width: 0.6 ms
Lead Channel Sensing Intrinsic Amplitude: 11.7 mV
Lead Channel Setting Pacing Amplitude: 2.5 V
Lead Channel Setting Pacing Pulse Width: 0.6 ms
Lead Channel Setting Sensing Sensitivity: 0.5 mV
Pulse Gen Serial Number: 7402114

## 2021-06-22 ENCOUNTER — Other Ambulatory Visit: Payer: Self-pay

## 2021-06-22 ENCOUNTER — Other Ambulatory Visit: Payer: PPO | Admitting: *Deleted

## 2021-06-22 ENCOUNTER — Telehealth: Payer: Self-pay

## 2021-06-22 DIAGNOSIS — I255 Ischemic cardiomyopathy: Secondary | ICD-10-CM

## 2021-06-22 DIAGNOSIS — Z79899 Other long term (current) drug therapy: Secondary | ICD-10-CM

## 2021-06-22 LAB — BASIC METABOLIC PANEL
BUN/Creatinine Ratio: 12 (ref 10–24)
BUN: 12 mg/dL (ref 8–27)
CO2: 25 mmol/L (ref 20–29)
Calcium: 9.2 mg/dL (ref 8.6–10.2)
Chloride: 105 mmol/L (ref 96–106)
Creatinine, Ser: 1.01 mg/dL (ref 0.76–1.27)
Glucose: 109 mg/dL — ABNORMAL HIGH (ref 70–99)
Potassium: 4.5 mmol/L (ref 3.5–5.2)
Sodium: 141 mmol/L (ref 134–144)
eGFR: 81 mL/min/{1.73_m2} (ref >59)

## 2021-06-22 MED ORDER — ENTRESTO 24-26 MG PO TABS
1.0000 | ORAL_TABLET | Freq: Two times a day (BID) | ORAL | 3 refills | Status: DC
Start: 2021-06-22 — End: 2021-07-13

## 2021-06-22 MED ORDER — ENTRESTO 24-26 MG PO TABS
1.0000 | ORAL_TABLET | Freq: Two times a day (BID) | ORAL | 3 refills | Status: DC
Start: 2021-06-22 — End: 2021-06-22

## 2021-06-22 MED ORDER — ENTRESTO 24-26 MG PO TABS: 1.0000 | ORAL_TABLET | Freq: Two times a day (BID) | ORAL | 3 refills | Status: DC

## 2021-06-22 NOTE — Telephone Encounter (Signed)
**Note De-Identified Mike Cole Obfuscation** The pt left his [employer] pt asst application for Entresto at the office with documents.  I have completed the providers page of his application and have e-mailed it to Dr Hassell Done nurse so she can print a Entresto 24-26 mg #180 with 3 refill prescription, have Dr Irish Lack sign and date it and the application, and to fax all to [employer] at the fax number written on the cover letter included or to place in the to be faxed basket in Medical Records to be faxed.

## 2021-06-22 NOTE — Telephone Encounter (Signed)
Completed paperwork faxed 

## 2021-06-28 NOTE — Progress Notes (Addendum)
Patient ID: [name]                 DOB: 10/25/1950                      MRN: [id number]     HPI: Mike Cole is a 71 y.o. male referred by Dr. Irish Lack to pharmacy clinic for HF medication management. PMH is significant for ischemic cardiomyopathy, CAD s/p MI in 2010, TIA, dyslipidemia, ventricular tachycardia, implantable defibrillator ([employer]), and ICD in place. Most recent LVEF 35-40% on 6/19. At last visit with Dr. Irish Lack, pt was changed from benazepril to [facility] 24/26mg  BID. PAP for Delene Loll has been submitted. Pt referred for f/u with PharmD for BMET and further CHF med titration.  Pt present to clinic in good spirits. He had been taking benazepril for years before his recent switch to [place]. Reports having various symptoms around the time he switched including headache, fogginess, dizziness, and tinnitus. All have improved beside the tinnitus which still comes and goes. Pt is hopeful this will also dissipate with time. Denies LEE, SOB stable. Pt says he will be able to afford a $45/month copay if patient assistance program for Delene Loll does not get approved. Apparently the pharmacy told his wife that Delene Loll would cost $700/month which is incorrect and why pt assistance was pursued initially. He states he only has about 1 week left of Entresto. He has been checking his home blood pressure 3-4x daily and has been monitoring his weight. He took his medications this morning and has not missed any doses. He brought in his home blood pressure cuff which read ~ 10 mmHg higher than our manual monitor. Average home readings have been in the 130-140s/60s, which he believed were higher than before. He struggles to limit his sodium in his diet and eats a lot of frozen meals and canned soup.  Current CHF meds: Entresto 24/26mg  BID, spironolactone 12.5 mg daily, carvedilol 3.125 mg BID, furosemide 20 mg 5 days/week, potassium chloride 10 MEQ daily Previously tried: benazepril 20 mg  daily BP goal: <130/54mmHg  Family History: The patient's family history includes CAD in his brother, brother, father, mother, and sister; Diabetes in his father; Heart disease in his brother.   Social History: The patient  reports that he quit smoking about 13 years ago. His smoking use included cigarettes. He has never used smokeless tobacco. He reports current alcohol use. He reports that he does not use drugs.   Diet: Reports he has a "terrible diet" but trying to incorporate more frozen veggies, lots of sandwiches and frozen meals, canned soup; drinks ~1 tea/day  Exercise: Exercise decreased during COVID. He has been doing things around the house. Went back to gym for first time in a month, aiming to go every day, use different machines, and walk at least a mile every day  Home BP readings: Average: 130-140s/60s HR 50-60s (High 153/61, Low 115/57, High 71, Low 52)  145/99 55 139/98, 71 145/71 63 131/87, 57 137/72, 62 122/67, 62 145/68, 57 130s/60s  Home Monitor (in-clinic): 151/91, 153/89 (L), 149/83  Clinic Monitor/Cuff: 140/74 (R), 136/72  Wt Readings from Last 3 Encounters:  06/08/21 215 lb (97.5 kg)  11/22/20 214 lb 3.2 oz (97.2 kg)  05/10/20 215 lb (97.5 kg)   BP Readings from Last 3 Encounters:  06/08/21 138/60  11/22/20 122/60  05/10/20 128/72   Pulse Readings from Last 3 Encounters:  06/08/21 64  11/22/20 (!) 52  05/10/20 69    Renal function: CrCl cannot be calculated (Unknown ideal weight.).  Past Medical History:  Diagnosis Date   Acute anterior myocardial infarction (HCC)    BMS to the LAD   Cardiogenic shock ([place])    resolved   Dyslipidemia    History of transient ischemic attack    Ischemic cardiomyopathy    EF of 20-25%   Ventricular tachycardia    arrest, resuscitated    Current Outpatient Medications on File Prior to Visit  Medication Sig Dispense Refill   aspirin EC 81 MG tablet Take 81 mg by mouth daily.     atorvastatin (LIPITOR)  40 MG tablet TAKE 1 TABLET BY MOUTH EVERY DAY AT 6PM 90 tablet 3   carvedilol (COREG) 3.125 MG tablet TAKE 1 TABLET BY MOUTH TWICE A DAY WITH MEALS NEEDS APPT FOR REFILLS 180 tablet 3   furosemide (LASIX) 20 MG tablet Take 1 tablet by mouth as directed. Takes 5 days a week. 90 tablet 3   Lecith-Inosi-Chol-B12-Liver (LIVERITE) 1.125 MCG TABS Take 1.125 mcg by mouth 2 (two) times daily.     NON FORMULARY Relief Factor take 4 capsules bid     potassium chloride (MICRO-K) 10 MEQ CR capsule TAKE 1 CAPSULE BY MOUTH EVERY DAY NEEDS APPT FOR REFILLS 90 capsule 3   sacubitril-valsartan (ENTRESTO) 24-26 MG Take 1 tablet by mouth 2 (two) times daily. 180 tablet 3   spironolactone (ALDACTONE) 25 MG tablet Take 0.5 tablets (12.5 mg total) by mouth daily. 45 tablet 3   No current facility-administered medications on file prior to visit.    No Known Allergies   Assessment/Plan:  1. CHF -  BP is elevated above goal of <130/80 mmHg with room to further optimize CHF regimen. Pending stable BMET today, will plan to increase spironolactone to 25 mg daily. Will wait to titrate Entresto until PAP is approved. Will continue carvedilol 3.125mg  BID (HR 60). Encouraged patient to limit salt intake to <2g/day and continue increasing physical activity.  Patient's home blood pressure cuff seems to be consistently measuring 10 mmHg off, provided him with list of validated BP cuffs if he would like to purchase a new monitor. Encouraged him to take his BP about once daily and continue monitoring it regularly. Will schedule f/u appt when final medication adjustments are made tomorrow.  Jethro Poling, PharmD Student assisted in this visit.  Megan E. Supple, PharmD, BCACP, [place] [address] [place], [place] [address] Phone: [phone]; Fax: [phone] 06/29/2021 12:07 PM

## 2021-06-28 NOTE — Progress Notes (Signed)
Remote ICD transmission.   

## 2021-06-29 ENCOUNTER — Other Ambulatory Visit: Payer: Self-pay

## 2021-06-29 ENCOUNTER — Ambulatory Visit: Payer: PPO | Admitting: Pharmacist

## 2021-06-29 VITALS — BP 136/72 | HR 62 | Wt 216.6 lb

## 2021-06-29 DIAGNOSIS — I5022 Chronic systolic (congestive) heart failure: Secondary | ICD-10-CM

## 2021-06-29 LAB — BASIC METABOLIC PANEL
BUN/Creatinine Ratio: 12 (ref 10–24)
BUN: 13 mg/dL (ref 8–27)
CO2: 26 mmol/L (ref 20–29)
Calcium: 9.3 mg/dL (ref 8.6–10.2)
Chloride: 104 mmol/L (ref 96–106)
Creatinine, Ser: 1.11 mg/dL (ref 0.76–1.27)
Glucose: 104 mg/dL — ABNORMAL HIGH (ref 70–99)
Potassium: 4.8 mmol/L (ref 3.5–5.2)
Sodium: 142 mmol/L (ref 134–144)
eGFR: 72 mL/min/{1.73_m2} (ref >59)

## 2021-06-29 NOTE — Patient Instructions (Addendum)
It was nice to meet you today!  Your blood pressure goal is <130/80 mmHg.  After we get the results of your labs, we will call you with next steps for medication optimization.   Continue taking your blood pressure once daily, a few hours after taking your medications.   Continue eating a heart healthy diet and limiting sodium intake to <2g/day.  Go to [email or web address] to see a list of great blood pressure monitor/cuffs.

## 2021-06-30 ENCOUNTER — Telehealth: Payer: Self-pay | Admitting: Pharmacist

## 2021-06-30 MED ORDER — SPIRONOLACTONE 25 MG PO TABS: 25.0000 mg | ORAL_TABLET | Freq: Every day | ORAL | 3 refills | Status: DC

## 2021-06-30 NOTE — Telephone Encounter (Signed)
BMET stable, will increase spironolactone from 12.5mg  to 25mg  daily for CHF benefit and BP lowering. Will follow up in clinic in 2 weeks for BP check and repeat BMET. Pt aware of lab results and med plan.

## 2021-07-05 ENCOUNTER — Encounter: Payer: Self-pay | Admitting: Pharmacist

## 2021-07-05 ENCOUNTER — Telehealth: Payer: Self-pay | Admitting: Pharmacist

## 2021-07-05 DIAGNOSIS — I5022 Chronic systolic (congestive) heart failure: Secondary | ICD-10-CM

## 2021-07-05 MED ORDER — ENTRESTO 24-26 MG PO TABS
1.0000 | ORAL_TABLET | Freq: Two times a day (BID) | ORAL | 0 refills | Status: DC
Start: 2021-07-05 — End: 2021-07-12

## 2021-07-05 NOTE — Telephone Encounter (Signed)
2 sample bottles of Entresto 24-26 placed up front for patient

## 2021-07-11 NOTE — Telephone Encounter (Signed)
Have we received a response from pt assistance for [facility] yet? Would like to be able to provide update for pt at his appt tomorrow, thanks!

## 2021-07-11 NOTE — Progress Notes (Signed)
Patient ID: [name]                 DOB: 30-Nov-1950                      MRN: [id number]     HPI: Mike Cole is a 71 y.o. male referred by Dr. Irish Cole to pharmacy clinic for HF medication management. PMH is significant for ischemic cardiomyopathy, CAD s/p MI in 2010, TIA, dyslipidemia, ventricular tachycardia, implantable defibrillator ([employer] Jude), and ICD in place. Most recent LVEF 35-40% on 6/19. At last visit with Dr. Irish Cole, pt was changed from benazepril to Entresto 24/26mg  BID and PAP for Entresto was submitted. At last appt with me on 1/25, spironolactone was increased to 25mg  daily.  Pt present to clinic in good spirits. Had a bit of GI upset and headache when he first increased the spironolactone dose but both have resolved and he's tolerating it well now. Denies LEE, SOB, and dizziness. Still having some tinnitus. He purchased a new BP cuff since his last visit since his old cuff was measuring off. Didn't bring in new cuff today. Did bring in a log of home readings: 133/70, 130/68, 134/71, 128/72, 144/73, 136/69, HR 51-60. Pt assistance for Delene Loll was approved on 1/19 but pt has not heard any communication about med deliver yet. Still has at least a week of samples at home.  Current CHF meds: Entresto 24-26mg  BID, spironolactone 25 mg daily, carvedilol 3.125 mg BID, furosemide 20 mg 5 days/week, potassium chloride 10 MEQ daily Previously tried: benazepril 20 mg daily BP goal: <130/32mmHg  Family History: The patient's family history includes CAD in his brother, brother, father, mother, and sister; Diabetes in his father; Heart disease in his brother.   Social History: The patient  reports that he quit smoking about 13 years ago. His smoking use included cigarettes. He has never used smokeless tobacco. He reports current alcohol use. He reports that he does not use drugs.   Diet: Trying to eat more vegetables. Lots of sandwiches and frozen meals, canned soup; drinks ~1  tea/day. Doesn't add salt to his food.  Exercise: Exercise decreased during COVID. He has been doing things around the house. Went back to gym for first time in a month, aiming to go every day, use different machines, and walk at least a mile every day  Home BP readings: 133/70, 130/68, 134/71, 128/72, 144/73, 136/69, HR 51-60  Wt Readings from Last 3 Encounters:  06/29/21 216 lb 9.6 oz (98.2 kg)  06/08/21 215 lb (97.5 kg)  11/22/20 214 lb 3.2 oz (97.2 kg)   BP Readings from Last 3 Encounters:  06/29/21 136/72  06/08/21 138/60  11/22/20 122/60   Pulse Readings from Last 3 Encounters:  06/29/21 62  06/08/21 64  11/22/20 (!) 52    Renal function: Estimated Creatinine Clearance: 73 mL/min (by C-G formula based on SCr of 1.11 mg/dL).  Past Medical History:  Diagnosis Date   Acute anterior myocardial infarction (HCC)    BMS to the LAD   Cardiogenic shock ([place])    resolved   Dyslipidemia    History of transient ischemic attack    Ischemic cardiomyopathy    EF of 20-25%   Ventricular tachycardia    arrest, resuscitated    Current Outpatient Medications on File Prior to Visit  Medication Sig Dispense Refill   aspirin EC 81 MG tablet Take 81 mg by mouth daily.     atorvastatin (LIPITOR)  40 MG tablet TAKE 1 TABLET BY MOUTH EVERY DAY AT 6PM 90 tablet 3   carvedilol (COREG) 3.125 MG tablet TAKE 1 TABLET BY MOUTH TWICE A DAY WITH MEALS NEEDS APPT FOR REFILLS 180 tablet 3   furosemide (LASIX) 20 MG tablet Take 1 tablet by mouth as directed. Takes 5 days a week. 90 tablet 3   Lecith-Inosi-Chol-B12-Liver (LIVERITE) 1.125 MCG TABS Take 1.125 mcg by mouth 2 (two) times daily.     NON FORMULARY Relief Factor take 4 capsules bid     potassium chloride (MICRO-K) 10 MEQ CR capsule TAKE 1 CAPSULE BY MOUTH EVERY DAY NEEDS APPT FOR REFILLS (Patient taking differently: 10 mEq. 5 days a week (with furosemide)) 90 capsule 3   sacubitril-valsartan (ENTRESTO) 24-26 MG Take 1 tablet by mouth 2 (two)  times daily. 180 tablet 3   sacubitril-valsartan (ENTRESTO) 24-26 MG Take 1 tablet by mouth 2 (two) times daily. 56 tablet 0   spironolactone (ALDACTONE) 25 MG tablet Take 1 tablet (25 mg total) by mouth daily. 90 tablet 3   No current facility-administered medications on file prior to visit.    No Known Allergies   Assessment/Plan:  1. CHF -  BP is elevated above goal of <130/80 mmHg with room to further optimize CHF regimen. Pending stable BMET today, will plan to increase Entresto to 49-51mg  BID, change Lasix to prn, and stop K supplementation. He was approved for pt assistance but has not heard from [employer] yet about his med shipping, so hopefully can have them send in higher dose. Will continue carvedilol 3.125mg  BID (HR 58) and spironolactone 25mg  daily. Encouraged patient to limit salt intake to <2g/day and continue increasing physical activity. Will schedule f/u appt when final medication adjustments are made tomorrow and encourage pt to bring in new BP cuff to next visit.   Karlei Waldo E. Teanna Elem, PharmD, BCACP, [place] [address], [place], [place] [address] Phone: [phone]; Fax: [phone] 07/11/2021 4:04 PM

## 2021-07-12 ENCOUNTER — Ambulatory Visit: Payer: PPO | Admitting: Pharmacist

## 2021-07-12 ENCOUNTER — Other Ambulatory Visit: Payer: Self-pay

## 2021-07-12 VITALS — BP 142/72 | HR 58 | Wt 215.0 lb

## 2021-07-12 DIAGNOSIS — I5022 Chronic systolic (congestive) heart failure: Secondary | ICD-10-CM | POA: Diagnosis not present

## 2021-07-12 LAB — BASIC METABOLIC PANEL
BUN/Creatinine Ratio: 12 (ref 10–24)
BUN: 14 mg/dL (ref 8–27)
CO2: 27 mmol/L (ref 20–29)
Calcium: 9.4 mg/dL (ref 8.6–10.2)
Chloride: 103 mmol/L (ref 96–106)
Creatinine, Ser: 1.19 mg/dL (ref 0.76–1.27)
Glucose: 88 mg/dL (ref 70–99)
Potassium: 4.8 mmol/L (ref 3.5–5.2)
Sodium: 140 mmol/L (ref 134–144)
eGFR: 66 mL/min/{1.73_m2} (ref >59)

## 2021-07-12 NOTE — Telephone Encounter (Signed)
**Note De-Identified Vance Hochmuth Obfuscation** I called [employer] and was advised by Liston Alba that the pt was approved for Entresto asst from 06/23/2021 until 06/23/2022. Case #: [id number]  [place] states that the pt was mailed a approval letter and that they faxed [place] one but I did not receive.

## 2021-07-12 NOTE — Patient Instructions (Signed)
Your blood pressure goal lis < 130/34mmHg  We will check labs today and I'll give you a call tomorrow with the results. If everything is stable, we would plan to increase your Entresto dose, stop your potassium supplement, and change your Lasix (furosemide) to just be as needed for swelling instead of on a regular basis  For now, continue taking your medications as you have been

## 2021-07-13 ENCOUNTER — Telehealth: Payer: Self-pay | Admitting: Pharmacist

## 2021-07-13 MED ORDER — ENTRESTO 49-51 MG PO TABS
1.0000 | ORAL_TABLET | Freq: Two times a day (BID) | ORAL | 0 refills | Status: DC
Start: 2021-07-13 — End: 2021-07-25

## 2021-07-13 NOTE — Telephone Encounter (Signed)
Labs stable. Will increase Entresto to 49-51mg  BID, change Lasix 20mg  daily to prn for swelling, and stop KCl supplementation. Pt aware to make these changes when pt assistance mails him higher dose of Entresto (likely in about a week). F/u with PharmD scheduled for 2 weeks later.

## 2021-07-25 ENCOUNTER — Other Ambulatory Visit: Payer: Self-pay

## 2021-07-25 MED ORDER — ENTRESTO 49-51 MG PO TABS
1.0000 | ORAL_TABLET | Freq: Two times a day (BID) | ORAL | 3 refills | Status: DC
Start: 2021-07-25 — End: 2021-08-09

## 2021-08-04 NOTE — Progress Notes (Signed)
Patient ID: Mike Cole                 DOB: 1950-11-09                      MRN: [id number] ? ? ? ? ?HPI: ?Mike Cole is a 71 y.o. male referred by Dr. Irish Lack to pharmacy clinic for HF medication management. PMH is significant for ischemic cardiomyopathy, CAD s/p MI in 2010, TIA, dyslipidemia, ventricular tachycardia, implantable defibrillator ([employer]), and ICD in place. Most recent LVEF 35-40% on 6/19. At last visit with Dr. Irish Lack, pt was changed from benazepril to Entresto 24/26mg  BID and PAP for Entresto was submitted. I saw pt on 1/25 and increased spironolactone to 25mg  daily. At follow up visit on 2/7, I increased his Entresto to 49-51mg  BID, changed Lasix 20mg  from daily to prn, and stopped K supplementation. ? ?Pt present to clinic in good spirits. Reports tolerating medications well. Received higher dose of Entresto from pt assistance, started taking it on 2/21 and received a 1 month supply. Hasn't needed any prn Lasix. Denies LEE, headache and dizziness. Occasional SOB sitting on the couch, is exercising without any limitations or SOB though. Has a newer BP cuff he's been using, hasn't brought to clinic yet but home readings as follows: 122/64, 102/62 (felt sluggish), 127/74, 125/69, 121/62. HR 56, 64,61, 54, 56. ? ? Still having some tinnitus. He purchased a new BP cuff since his last visit since his old cuff was measuring off. Didn't bring in new cuff today. Did bring in a log of home readings: 133/70, 130/68, 134/71, 128/72, 144/73, 136/69, HR 51-60. Pt assistance for Delene Loll was approved on 1/19 but pt has not heard any communication about med deliver yet. Still has at least a week of samples at home. ? ?Current CHF meds: Entresto 49-51mg  BID, spironolactone 25mg  daily, carvedilol 3.125mg  BID, furosemide 20mg  prn ?Previously tried: benazepril 20 mg daily ?BP goal: <130/39mmHg ? ?Family History: The patient's family history includes CAD in his brother, brother, father, mother, and  sister; Diabetes in his father; Heart disease in his brother.  ? ?Social History: The patient  reports that he quit smoking about 13 years ago. His smoking use included cigarettes. He has never used smokeless tobacco. He reports current alcohol use. He reports that he does not use drugs.  ? ?Diet: Trying to eat more vegetables. Lots of sandwiches and frozen meals, canned soup; drinks ~1 tea/day. Doesn't add salt to his food. ? ?Exercise: Exercise decreased during COVID. He has been doing things around the house. Went back to gym for first time in a month, aiming to go every day, use different machines, and walk at least a mile every day ? ?Home BP readings: 133/70, 130/68, 134/71, 128/72, 144/73, 136/69, HR 51-60 ? ?Wt Readings from Last 3 Encounters:  ?07/12/21 215 lb (97.5 kg)  ?06/29/21 216 lb 9.6 oz (98.2 kg)  ?06/08/21 215 lb (97.5 kg)  ? ?BP Readings from Last 3 Encounters:  ?07/12/21 (!) 142/72  ?06/29/21 136/72  ?06/08/21 138/60  ? ?Pulse Readings from Last 3 Encounters:  ?07/12/21 (!) 58  ?06/29/21 62  ?06/08/21 64  ? ? ?Renal function: ?CrCl cannot be calculated (Patient's most recent lab result is older than the maximum 21 days allowed.). ? ?Past Medical History:  ?Diagnosis Date  ? Acute anterior myocardial infarction [facility])   ? BMS to the LAD  ? Cardiogenic shock ([place])   ? resolved  ?  Dyslipidemia   ? History of transient ischemic attack   ? Ischemic cardiomyopathy   ? EF of 20-25%  ? Ventricular tachycardia   ? arrest, resuscitated  ? ? ?Current Outpatient Medications on File Prior to Visit  ?Medication Sig Dispense Refill  ? aspirin EC 81 MG tablet Take 81 mg by mouth daily.    ? atorvastatin (LIPITOR) 40 MG tablet TAKE 1 TABLET BY MOUTH EVERY DAY AT 6PM 90 tablet 3  ? carvedilol (COREG) 3.125 MG tablet TAKE 1 TABLET BY MOUTH TWICE A DAY WITH MEALS NEEDS APPT FOR REFILLS 180 tablet 3  ? furosemide (LASIX) 20 MG tablet Take 1 tablet (20 mg total) by mouth as needed for edema. 90 tablet 3  ?  Lecith-Inosi-Chol-B12-Liver (LIVERITE) 1.125 MCG TABS Take 1.125 mcg by mouth 2 (two) times daily.    ? NON FORMULARY Relief Factor take 4 capsules bid    ? sacubitril-valsartan (ENTRESTO) 49-51 MG Take 1 tablet by mouth 2 (two) times daily. 180 tablet 3  ? spironolactone (ALDACTONE) 25 MG tablet Take 1 tablet (25 mg total) by mouth daily. 90 tablet 3  ? ?No current facility-administered medications on file prior to visit.  ? ? ?No Known Allergies ? ? ?Assessment/Plan: ? ?1. CHF -  BP has improved and is now at goal <130/80 mmHg with room to further optimize CHF regimen. Will check BMET today with recent dose increase of Entresto. Pending lab results, will either increase Entresto to 97-103mg  BID (receiving from pt assistance) or add Jardiance 10mg  daily ($90/90 day supply). For now, he will continue Entresto 49-51mg  BID, carvedilol 3.125mg  BID (HR 50s), spironolactone 25mg  daily, and Lasix 20mg  prn. Will schedule f/u appt when final medication adjustments are made and encourage pt to bring in new BP cuff to next visit. ? ? ?Sana Tessmer E. Giovany Cosby, PharmD, BCACP, CPP ?[facility], [place], [place] [address] ?Phone: [phone]; Fax: [phone] ?08/04/2021 4:43 PM ? ?

## 2021-08-05 ENCOUNTER — Ambulatory Visit (INDEPENDENT_AMBULATORY_CARE_PROVIDER_SITE_OTHER): Payer: PPO | Admitting: Pharmacist

## 2021-08-05 ENCOUNTER — Other Ambulatory Visit: Payer: Self-pay

## 2021-08-05 VITALS — BP 130/64 | HR 52 | Wt 216.0 lb

## 2021-08-05 DIAGNOSIS — I5022 Chronic systolic (congestive) heart failure: Secondary | ICD-10-CM | POA: Diagnosis not present

## 2021-08-05 NOTE — Patient Instructions (Addendum)
We'll check your labs today and I'll call you next week with results ? ?We will plan to either add Jardiance once daily or increase your Entresto dose ? ?Continue taking your medications as you have been for now ?

## 2021-08-06 LAB — BASIC METABOLIC PANEL
BUN/Creatinine Ratio: 13 (ref 10–24)
BUN: 13 mg/dL (ref 8–27)
CO2: 23 mmol/L (ref 20–29)
Calcium: 9.1 mg/dL (ref 8.6–10.2)
Chloride: 106 mmol/L (ref 96–106)
Creatinine, Ser: 1.04 mg/dL (ref 0.76–1.27)
Glucose: 74 mg/dL (ref 70–99)
Potassium: 4.4 mmol/L (ref 3.5–5.2)
Sodium: 142 mmol/L (ref 134–144)
eGFR: 77 mL/min/{1.73_m2} (ref >59)

## 2021-08-09 ENCOUNTER — Telehealth: Payer: Self-pay | Admitting: Pharmacist

## 2021-08-09 MED ORDER — ENTRESTO 97-103 MG PO TABS
1.0000 | ORAL_TABLET | Freq: Two times a day (BID) | ORAL | 3 refills | Status: DC
Start: 2021-08-09 — End: 2022-05-30

## 2021-08-09 NOTE — Telephone Encounter (Signed)
BMET stable. Will increase Entresto to 97-'103mg'$  BID. New rx sent to pt assistance. Pt has 2 weeks left of current 49-'51mg'$  BID that he'll use up before starting on higher dose. Scheduled f/u with me in 4 weeks, 2 weeks after dose increase. Pt aware of med plan and follow up. ?

## 2021-08-15 ENCOUNTER — Encounter: Payer: Self-pay | Admitting: Pharmacist

## 2021-08-21 ENCOUNTER — Other Ambulatory Visit: Payer: Self-pay | Admitting: Interventional Cardiology

## 2021-08-25 ENCOUNTER — Encounter: Payer: Self-pay | Admitting: Pharmacist

## 2021-09-06 ENCOUNTER — Ambulatory Visit: Payer: PPO | Admitting: Pharmacist

## 2021-09-06 VITALS — BP 128/68 | HR 55 | Wt 213.0 lb

## 2021-09-06 DIAGNOSIS — I5022 Chronic systolic (congestive) heart failure: Secondary | ICD-10-CM | POA: Diagnosis not present

## 2021-09-06 LAB — BASIC METABOLIC PANEL
BUN/Creatinine Ratio: 16 (ref 10–24)
BUN: 17 mg/dL (ref 8–27)
CO2: 24 mmol/L (ref 20–29)
Calcium: 9.3 mg/dL (ref 8.6–10.2)
Chloride: 104 mmol/L (ref 96–106)
Creatinine, Ser: 1.05 mg/dL (ref 0.76–1.27)
Glucose: 87 mg/dL (ref 70–99)
Potassium: 4.9 mmol/L (ref 3.5–5.2)
Sodium: 139 mmol/L (ref 134–144)
eGFR: 76 mL/min/{1.73_m2} (ref >59)

## 2021-09-06 NOTE — Patient Instructions (Addendum)
It was nice to see you today! ? ?Your blood pressure is at goal < 130/28mHg ? ?Continue taking your current medications ? ?I'll send a message to Dr VIrish Lackand his nurse to recheck an echo to reassess the pumping function of your heart ? ?

## 2021-09-06 NOTE — Progress Notes (Signed)
Patient ID: Mike Cole                 DOB: 16-Nov-1950                      MRN: [id number] ? ? ? ? ?HPI: ?Mike Cole is a 71 y.o. male referred by Dr. Irish Lack to pharmacy clinic for HF medication management. PMH is significant for ischemic cardiomyopathy, CAD s/p MI in 2010, TIA, dyslipidemia, ventricular tachycardia, implantable defibrillator (St. Jude), and ICD in place. Most recent LVEF 35-40% on 11/2017. At last visit with Dr. Irish Lack, pt was changed from benazepril to [facility] 24/'26mg'$  BID and PAP for Entresto was submitted. I saw pt on 1/25 and increased spironolactone to '25mg'$  daily. At follow up visit on 2/7, I increased his Entresto to 49-'51mg'$  BID, changed Lasix '20mg'$  from daily to prn, and stopped K supplementation. At 3/3 visit with me, I increased Entresto to 97-'103mg'$  BID. ? ?Pt present to clinic in good spirits. Hasn't needed any prn Lasix. Tolerating higher dose of Entresto well which he received from patietn assistance. Denies LEE, headache, dizziness, blurred vision, and SOB. Has a newer BP cuff he's been using which he brought in today. All readings at goal. Home weights have been stable. Took AM meds today at 7am. Drank 2-3 cups of half caffeinated coffee. Was scheduled for colonoscopy last week. Went in for his procedure and then had it canceled because the office never requested cardiac clearance from [place] and they saw his reduced EF from 2019. ? ?Home cuff reading: 131/77, HR 62; recheck ?Clinic reading: 128/68, HR 58-62 ? ?Current CHF meds: Entresto 97-'103mg'$  BID, spironolactone '25mg'$  daily, carvedilol 3.'125mg'$  BID, furosemide '20mg'$  prn ?Previously tried: benazepril 20 mg daily ?BP goal: <130/56mHg ? ?Family History: The patient's family history includes CAD in his brother, brother, father, mother, and sister; Diabetes in his father; Heart disease in his brother.  ? ?Social History: The patient  reports that he quit smoking about 13 years ago. His smoking use included cigarettes. He has never  used smokeless tobacco. He reports current alcohol use. He reports that he does not use drugs.  ? ?Diet: Trying to eat more vegetables. Lots of sandwiches and frozen meals, canned soup; drinks ~1 tea/day. Doesn't add salt to his food. ? ?Exercise: Exercise decreased during COVID. He has been doing things around the house. Went back to gym for first time in a month, aiming to go every day, use different machines, and walk at least a mile every day ? ?Home BP readings: 124/68, 121/69, 133/72, 117/62, 119/63, 118/68, 125/66, HR usually 54-61, did have a HR of 48 but increased to mid 50s ? ?Wt Readings from Last 3 Encounters:  ?08/05/21 216 lb (98 kg)  ?07/12/21 215 lb (97.5 kg)  ?06/29/21 216 lb 9.6 oz (98.2 kg)  ? ?BP Readings from Last 3 Encounters:  ?08/05/21 130/64  ?07/12/21 (!) 142/72  ?06/29/21 136/72  ? ?Pulse Readings from Last 3 Encounters:  ?08/05/21 (!) 52  ?07/12/21 (!) 58  ?06/29/21 62  ? ? ?Renal function: ?CrCl cannot be calculated (Patient's most recent lab result is older than the maximum 21 days allowed.). ? ?Past Medical History:  ?Diagnosis Date  ? Acute anterior myocardial infarction ([facility]   ? BMS to the LAD  ? Cardiogenic [facility] ([facility]   ? resolved  ? Dyslipidemia   ? History of transient ischemic attack   ? Ischemic cardiomyopathy   ? EF of 20-25%  ?  Ventricular tachycardia   ? arrest, resuscitated  ? ? ?Current Outpatient Medications on File Prior to Visit  ?Medication Sig Dispense Refill  ? aspirin EC 81 MG tablet Take 81 mg by mouth daily.    ? atorvastatin (LIPITOR) 40 MG tablet TAKE 1 TABLET BY MOUTH EVERY DAY AT 6PM 90 tablet 3  ? carvedilol (COREG) 3.125 MG tablet TAKE 1 TABLET BY MOUTH TWICE A DAY WITH MEALS NEEDS APPT FOR REFILLS 180 tablet 3  ? furosemide (LASIX) 20 MG tablet Take 1 tablet (20 mg total) by mouth as needed for edema. 90 tablet 3  ? Lecith-Inosi-Chol-B12-Liver (LIVERITE) 1.125 MCG TABS Take 1.125 mcg by mouth 2 (two) times daily.    ? NON FORMULARY Relief Factor take 4  capsules bid    ? sacubitril-valsartan (ENTRESTO) 97-103 MG Take 1 tablet by mouth 2 (two) times daily. 180 tablet 3  ? spironolactone (ALDACTONE) 25 MG tablet Take 1 tablet (25 mg total) by mouth daily. 90 tablet 3  ? ?No current facility-administered medications on file prior to visit.  ? ? ?No Known Allergies ? ? ?Assessment/Plan: ? ?1. CHF -  BP is at goal <130/80 mmHg. Will check BMET today with recent dose increase of Entresto. Will continue Entresto 97-'103mg'$  BID, carvedilol 3.'125mg'$  BID (HR 50s), spironolactone '25mg'$  daily, and Lasix '20mg'$  prn. Discussed adding Jardiance today, pt wishes to defer this due to side effect concerns and pill burden. Will send message to Dr Irish Lack regarding rechecking echo as pt has not had LVEF assessed since 2019 and has been started on multiple CHF meds since then.  ? ?Cressida Milford E. Demone Lyles, PharmD, BCACP, CPP ?[id number] [place], [place] [address] ?Phone: [phone]; Fax: [phone] ?09/06/2021 7:24 AM ? ?

## 2021-09-08 ENCOUNTER — Telehealth: Payer: Self-pay | Admitting: *Deleted

## 2021-09-08 DIAGNOSIS — I255 Ischemic cardiomyopathy: Secondary | ICD-10-CM

## 2021-09-08 DIAGNOSIS — I5022 Chronic systolic (congestive) heart failure: Secondary | ICD-10-CM

## 2021-09-08 NOTE — Telephone Encounter (Signed)
Per DPR it is OK to leave message on voicemail.  Left message that Dr Irish Lack recommended echocardiogram in 3 months and that office would call him to schedule ?

## 2021-09-08 NOTE — Telephone Encounter (Signed)
-----   Message from Jettie Booze, MD sent at 09/06/2021  6:53 PM EDT ----- ?OK to check echo in 3 months after having stayed on the current meds.  ? ?JV ?----- Message ----- ?From: Leeroy Bock, RPH-CPP ?Sent: 09/06/2021   8:59 AM EDT ?To: Jettie Booze, MD, # ? ?See plan - pt should be due for updated echo after CHF meds have been optimized, thanks! ? ?

## 2021-09-16 ENCOUNTER — Ambulatory Visit (INDEPENDENT_AMBULATORY_CARE_PROVIDER_SITE_OTHER): Payer: PPO

## 2021-09-16 DIAGNOSIS — I255 Ischemic cardiomyopathy: Secondary | ICD-10-CM

## 2021-09-16 DIAGNOSIS — I5022 Chronic systolic (congestive) heart failure: Secondary | ICD-10-CM

## 2021-09-16 LAB — CUP PACEART REMOTE DEVICE CHECK
Battery Remaining Longevity: 55 mo
Battery Remaining Percentage: 53 %
Battery Voltage: 2.95 V
Brady Statistic RV Percent Paced: 1 %
Date Time Interrogation Session: 20230414020016
HighPow Impedance: 75 Ohm
HighPow Impedance: 75 Ohm
Implantable Lead Implant Date: 20100527
Implantable Lead Implant Date: 20100527
Implantable Lead Location: 753859
Implantable Lead Location: 753860
Implantable Pulse Generator Implant Date: 20180202
Lead Channel Impedance Value: 380 Ohm
Lead Channel Pacing Threshold Amplitude: 1.25 V
Lead Channel Pacing Threshold Pulse Width: 0.6 ms
Lead Channel Sensing Intrinsic Amplitude: 11.7 mV
Lead Channel Setting Pacing Amplitude: 2.5 V
Lead Channel Setting Pacing Pulse Width: 0.6 ms
Lead Channel Setting Sensing Sensitivity: 0.5 mV
Pulse Gen Serial Number: 7402114

## 2021-10-04 NOTE — Progress Notes (Signed)
Remote ICD transmission.   

## 2021-11-30 ENCOUNTER — Encounter: Payer: PPO | Admitting: Internal Medicine

## 2021-12-07 ENCOUNTER — Ambulatory Visit (INDEPENDENT_AMBULATORY_CARE_PROVIDER_SITE_OTHER): Payer: PPO | Admitting: Internal Medicine

## 2021-12-07 ENCOUNTER — Encounter: Payer: Self-pay | Admitting: Internal Medicine

## 2021-12-07 ENCOUNTER — Ambulatory Visit (HOSPITAL_COMMUNITY): Payer: PPO | Attending: Cardiology

## 2021-12-07 VITALS — BP 112/64 | HR 49 | Ht 74.0 in | Wt 211.8 lb

## 2021-12-07 DIAGNOSIS — Z95810 Presence of automatic (implantable) cardiac defibrillator: Secondary | ICD-10-CM

## 2021-12-07 DIAGNOSIS — Z79899 Other long term (current) drug therapy: Secondary | ICD-10-CM

## 2021-12-07 DIAGNOSIS — I255 Ischemic cardiomyopathy: Secondary | ICD-10-CM | POA: Insufficient documentation

## 2021-12-07 DIAGNOSIS — I5022 Chronic systolic (congestive) heart failure: Secondary | ICD-10-CM | POA: Insufficient documentation

## 2021-12-07 DIAGNOSIS — I472 Ventricular tachycardia, unspecified: Secondary | ICD-10-CM

## 2021-12-07 LAB — ECHOCARDIOGRAM COMPLETE
Area-P 1/2: 2.22 cm2
Calc EF: 36.8 %
P 1/2 time: 513 msec
S' Lateral: 4.8 cm
Single Plane A2C EF: 36.3 %
Single Plane A4C EF: 35.4 %

## 2021-12-07 MED ORDER — PERFLUTREN LIPID MICROSPHERE
1.0000 mL | INTRAVENOUS | Status: AC | PRN
  Administered 2021-12-07: 3 mL via INTRAVENOUS

## 2021-12-07 NOTE — Progress Notes (Signed)
Patient ID: BATU CASSIN, male   DOB: Apr 08, 1951, 71 y.o.   MRN: [id number] Patient Care Team: Scheryl Marten, [place] as PCP - General (Internal Medicine) Jettie Booze, MD as PCP - Cardiology (Cardiology)   HPI  Mike Cole is a 71 y.o. male seen in followup for ventricular tachycardia/fibrillation presented as an out of hospital cardiac arrest. He  underwent intervention of an occluded LAD but had persistent left ventricular dysfunction prompting dual chamber ICD implantation St Jude/  Generator replacement 2/18.  He has atrial lead failure (to capture) prompting reprogramming of the device DDD-VVI lower rate of 40, this to avoid ventricular pacing  Covid infection   The patient denies chest pain, shortness of breath, nocturnal dyspnea, orthopnea or peripheral edema.  There have been no palpitations, lightheadedness or syncope.     DATE TEST EF   5/10 Echo 30-35 %   11/17 Echo 35-40 %   6/19 Echo  35-40% Anterior AK            Date Cr K Hgb LDL  1/18  1.18 4.6 13.2    6/18 1.23 4.4   53  11/19 1.19 4.8    10/20 1.08 4.8  (2/20) 47  12/21 1.07 4.8 13.5 43  4/23 1.05 4.9        Past Medical History:  Diagnosis Date   Acute anterior myocardial infarction (HCC)    BMS to the LAD   Cardiogenic shock ([place])    resolved   Dyslipidemia    History of transient ischemic attack    Ischemic cardiomyopathy    EF of 20-25%   Ventricular tachycardia ([place])    arrest, resuscitated    Past Surgical History:  Procedure Laterality Date   EP IMPLANTABLE DEVICE N/A 07/07/2016   Procedure: ICD Generator Changeout;  Surgeon: Deboraha Sprang, MD;  Location: [facility];  Service: Cardiovascular;  Laterality: N/A;   St Jude CURRENT + DR  10/30/2008    Current Outpatient Medications  Medication Sig Dispense Refill   aspirin EC 81 MG tablet Take 81 mg by mouth daily.     atorvastatin (LIPITOR) 40 MG tablet TAKE 1 TABLET BY MOUTH EVERY DAY AT 6PM 90 tablet 3    carvedilol (COREG) 3.125 MG tablet TAKE 1 TABLET BY MOUTH TWICE A DAY WITH MEALS NEEDS APPT FOR REFILLS 180 tablet 3   Lecith-Inosi-Chol-B12-Liver (LIVERITE) 1.125 MCG TABS Take 1.125 mcg by mouth 2 (two) times daily.     NON FORMULARY Relief Factor take 4 capsules bid     sacubitril-valsartan (ENTRESTO) 97-103 MG Take 1 tablet by mouth 2 (two) times daily. 180 tablet 3   spironolactone (ALDACTONE) 25 MG tablet Take 1 tablet (25 mg total) by mouth daily. 90 tablet 3   No current facility-administered medications for this visit.   Facility-Administered Medications Ordered in Other Visits  Medication Dose Route Frequency Provider Last Rate Last Admin   perflutren lipid microspheres (DEFINITY) IV suspension  1-10 mL Intravenous PRN Jettie Booze, MD   3 mL at 12/07/21 1344    No Known Allergies  Review of Systems negative except from HPI and PMH  Physical Exam: BP 112/64   Pulse (!) 49   Ht '6\' 2"'$  (1.88 m)   Wt 211 lb 12.8 oz (96.1 kg)   BMI 27.19 kg/m  Well developed and well nourished in no acute distress HENT normal Neck supple with JVP-flat Clear Device pocket well healed; without hematoma or erythema.  There is no tethering  Regular rate and rhythm, no murmur Abd-soft with active BS No Clubbing cyanosis  edema Skin-warm and dry A & Oriented  Grossly normal sensory and motor function  ECG sinus at 49 Levels 19/08/455 Poor R wave progression  Assessment and  Plan  Ischemic cardiomyopathy  Aborted Cardiac Arrest   Implantable defibrillator-St. Jude     Hyperlipidemia  Congestive heart failure-class II  Low voltage ECG   High Risk Medication Surveillance Aldactone  Atrial lead failure  Sinus node dysfunction   Without symptoms of ischemia.  Continue 40 mg atorvastatin, ASA 81 With his cardiomyopathy, we will continue the Entresto 97/103 and spironolactone 25.  Potassium was normal in April.  Sinus bradycardia is largely asymptomatic, but will continue  his carvedilol 3.125.  Need to check his hemoglobin.  Euvolemic we will continue him on his furosemide as needed  Device function is normal. noProgramming changes   See Paceart for details

## 2021-12-07 NOTE — Patient Instructions (Signed)
Medication Instructions:  Your physician recommends that you continue on your current medications as directed. Please refer to the Current Medication list given to you today.  *If you need a refill on your cardiac medications before your next appointment, please call your pharmacy*   Lab Work: CBC today If you have labs (blood work) drawn today and your tests are completely normal, you will receive your results only by: [place] (if you have MyChart) OR A paper copy in the mail If you have any lab test that is abnormal or we need to change your treatment, we will call you to review the results.   Testing/Procedures: None ordered.    Follow-Up: At [facility], you and your health needs are our priority.  As part of our continuing mission to provide you with exceptional heart care, we have created designated Provider Care Teams.  These Care Teams include your primary Cardiologist (physician) and Advanced Practice Providers (APPs -  Physician Assistants and Nurse Practitioners) who all work together to provide you with the care you need, when you need it.  We recommend signing up for the patient portal called "MyChart".  Sign up information is provided on this After Visit Summary.  MyChart is used to connect with patients for Virtual Visits (Telemedicine).  Patients are able to view lab/test results, encounter notes, upcoming appointments, etc.  Non-urgent messages can be sent to your provider as well.   To learn more about what you can do with MyChart, go to [email or web address].    Your next appointment:   12 months with Dr Caryl Comes  Important Information About Sugar

## 2021-12-08 LAB — CBC
Hematocrit: 43 % (ref 37.5–51.0)
Hemoglobin: 14.4 g/dL (ref 13.0–17.7)
MCH: 32.8 pg (ref 26.6–33.0)
MCHC: 33.5 g/dL (ref 31.5–35.7)
MCV: 98 fL — ABNORMAL HIGH (ref 79–97)
Platelets: 153 10*3/uL (ref 150–450)
RBC: 4.39 x10E6/uL (ref 4.14–5.80)
RDW: 13.1 % (ref 11.6–15.4)
WBC: 4.7 10*3/uL (ref 3.4–10.8)

## 2021-12-16 ENCOUNTER — Ambulatory Visit (INDEPENDENT_AMBULATORY_CARE_PROVIDER_SITE_OTHER): Payer: PPO

## 2021-12-16 DIAGNOSIS — I255 Ischemic cardiomyopathy: Secondary | ICD-10-CM | POA: Diagnosis not present

## 2021-12-16 LAB — CUP PACEART REMOTE DEVICE CHECK
Battery Remaining Longevity: 54 mo
Battery Remaining Percentage: 52 %
Battery Voltage: 2.93 V
Brady Statistic RV Percent Paced: 1 %
Date Time Interrogation Session: 20230714020020
HighPow Impedance: 77 Ohm
HighPow Impedance: 77 Ohm
Implantable Lead Implant Date: 20100527
Implantable Lead Implant Date: 20100527
Implantable Lead Location: 753859
Implantable Lead Location: 753860
Implantable Pulse Generator Implant Date: 20180202
Lead Channel Impedance Value: 390 Ohm
Lead Channel Pacing Threshold Amplitude: 0.75 V
Lead Channel Pacing Threshold Pulse Width: 0.6 ms
Lead Channel Sensing Intrinsic Amplitude: 11.7 mV
Lead Channel Setting Pacing Amplitude: 2.5 V
Lead Channel Setting Pacing Pulse Width: 0.6 ms
Lead Channel Setting Sensing Sensitivity: 0.5 mV
Pulse Gen Serial Number: 7402114

## 2021-12-29 NOTE — Progress Notes (Signed)
Remote ICD transmission.   

## 2022-01-26 ENCOUNTER — Other Ambulatory Visit: Payer: Self-pay | Admitting: Interventional Cardiology

## 2022-03-17 ENCOUNTER — Ambulatory Visit (INDEPENDENT_AMBULATORY_CARE_PROVIDER_SITE_OTHER): Payer: PPO

## 2022-03-17 DIAGNOSIS — I255 Ischemic cardiomyopathy: Secondary | ICD-10-CM

## 2022-03-17 LAB — CUP PACEART REMOTE DEVICE CHECK
Battery Remaining Longevity: 51 mo
Battery Remaining Percentage: 49 %
Battery Voltage: 2.93 V
Brady Statistic RV Percent Paced: 1 %
Date Time Interrogation Session: 20231013020014
HighPow Impedance: 71 Ohm
HighPow Impedance: 71 Ohm
Implantable Lead Implant Date: 20100527
Implantable Lead Implant Date: 20100527
Implantable Lead Location: 753859
Implantable Lead Location: 753860
Implantable Pulse Generator Implant Date: 20180202
Lead Channel Impedance Value: 350 Ohm
Lead Channel Pacing Threshold Amplitude: 0.75 V
Lead Channel Pacing Threshold Pulse Width: 0.6 ms
Lead Channel Sensing Intrinsic Amplitude: 11.7 mV
Lead Channel Setting Pacing Amplitude: 2.5 V
Lead Channel Setting Pacing Pulse Width: 0.6 ms
Lead Channel Setting Sensing Sensitivity: 0.5 mV
Pulse Gen Serial Number: 7402114

## 2022-03-22 NOTE — Progress Notes (Signed)
Remote ICD transmission.   

## 2022-05-30 ENCOUNTER — Other Ambulatory Visit: Payer: Self-pay

## 2022-05-30 MED ORDER — ENTRESTO 97-103 MG PO TABS: 1.0000 | ORAL_TABLET | Freq: Two times a day (BID) | ORAL | 0 refills | Status: DC

## 2022-06-05 HISTORY — PX: CATARACT EXTRACTION, BILATERAL: SHX1313

## 2022-06-08 ENCOUNTER — Other Ambulatory Visit: Payer: Self-pay

## 2022-06-08 MED ORDER — ENTRESTO 97-103 MG PO TABS: 1.0000 | ORAL_TABLET | Freq: Two times a day (BID) | ORAL | 0 refills | Status: DC

## 2022-06-13 ENCOUNTER — Telehealth: Payer: Self-pay

## 2022-06-13 NOTE — Telephone Encounter (Signed)
**Note De-Identified Mike Cole Obfuscation** The pts completed [employer] application for Entresto assistance was let at the office with documents.  I have completed the providers page of his application and have e-mailed all to Dr Hassell Done nurse so she can obtain his signature, date it, and to hen fax all to [employer] at the fax number written on the cover letter included.

## 2022-06-16 ENCOUNTER — Ambulatory Visit (INDEPENDENT_AMBULATORY_CARE_PROVIDER_SITE_OTHER): Payer: PPO

## 2022-06-16 DIAGNOSIS — I255 Ischemic cardiomyopathy: Secondary | ICD-10-CM | POA: Diagnosis not present

## 2022-06-16 LAB — CUP PACEART REMOTE DEVICE CHECK
Battery Remaining Longevity: 50 mo
Battery Remaining Percentage: 48 %
Battery Voltage: 2.93 V
Brady Statistic RV Percent Paced: 1 %
Date Time Interrogation Session: 20240112020014
HighPow Impedance: 79 Ohm
HighPow Impedance: 79 Ohm
Implantable Lead Connection Status: 753985
Implantable Lead Connection Status: 753985
Implantable Lead Implant Date: 20100527
Implantable Lead Implant Date: 20100527
Implantable Lead Location: 753859
Implantable Lead Location: 753860
Implantable Pulse Generator Implant Date: 20180202
Lead Channel Impedance Value: 380 Ohm
Lead Channel Pacing Threshold Amplitude: 0.75 V
Lead Channel Pacing Threshold Pulse Width: 0.6 ms
Lead Channel Sensing Intrinsic Amplitude: 11.7 mV
Lead Channel Setting Pacing Amplitude: 2.5 V
Lead Channel Setting Pacing Pulse Width: 0.6 ms
Lead Channel Setting Sensing Sensitivity: 0.5 mV
Pulse Gen Serial Number: 7402114

## 2022-06-16 NOTE — Telephone Encounter (Signed)
Signed paperwork faxed 

## 2022-06-24 NOTE — Progress Notes (Signed)
Cardiology Office Note   Date:  06/26/2022   ID:  Mike, Cole 1950-12-08, MRN [id number]  PCP:  Scheryl Marten, PA    No chief complaint on file.  CAD/Old MI  Wt Readings from Last 3 Encounters:  06/26/22 211 lb 6.4 oz (95.9 kg)  12/07/21 211 lb 12.8 oz (96.1 kg)  09/06/21 213 lb (96.6 kg)       History of Present Illness: Mike Cole is a 72 y.o. male   who had an anterior MI  In March 5053 complicated by cardiac arrest and subsequent ischemic cardiomyopathy. He had an AICD placed.  Last EF was 35-40% in 11/17.     He had a generator changeout a few years ago, and felt poorly after this.  It was difficult to walk.  He had his meds adjusted and he felt better, after Dr. Olin Pia visit.  All of his sx have resolved since then.   In 2018, we recommended: "He needs to find a primary care physician. A list of primary care physicians in the community was given today. We talked about the need for screening for colon cancer, prostate cancer as well as recommendations for pneumonia shot, flu shot among others."      He joined a gym in 2018.  He does the treadmill and elliptical 5 days a week and lifts weights on the machines.   With COVID, gym closed.  Exercise has decreased.  He walks  a little.  He has been doing things around the house.    Echo in 2019: "Left ventricle: The cavity size was moderately dilated. Systolic    function was moderately reduced. The estimated ejection fraction    was in the range of 35% to 40%. Akinesis of the    mid-apicalanteroseptal and apical myocardium. Features are    consistent with a pseudonormal left ventricular filling pattern,    with concomitant abnormal relaxation and increased filling    pressure (grade 2 diastolic dysfunction).  - Mitral valve: There was mild regurgitation.  - Tricuspid valve: There was mild-moderate regurgitation."  He was switched to [facility] in 2023.  EF did not change.   He goes to a gym 3x/week.   Walks and does some weights.     Past Medical History:  Diagnosis Date   Acute anterior myocardial infarction (HCC)    BMS to the LAD   Cardiogenic shock ([place])    resolved   Dyslipidemia    History of transient ischemic attack    Ischemic cardiomyopathy    EF of 20-25%   Ventricular tachycardia ([place])    arrest, resuscitated    Past Surgical History:  Procedure Laterality Date   EP IMPLANTABLE DEVICE N/A 07/07/2016   Procedure: ICD Generator Changeout;  Surgeon: Deboraha Sprang, MD;  Location: [facility];  Service: Cardiovascular;  Laterality: N/A;   St Jude CURRENT + DR  10/30/2008     Current Outpatient Medications  Medication Sig Dispense Refill   aspirin EC 81 MG tablet Take 81 mg by mouth daily.     atorvastatin (LIPITOR) 40 MG tablet TAKE 1 TABLET BY MOUTH EVERY DAY AT 6PM 90 tablet 3   carvedilol (COREG) 3.125 MG tablet TAKE 1 TABLET BY MOUTH TWICE A DAY WITH MEALS NEEDS APPT FOR REFILLS 180 tablet 1   Lecith-Inosi-Chol-B12-Liver (LIVERITE) 1.125 MCG TABS Take 1.125 mcg by mouth 2 (two) times daily.     NON FORMULARY Relief Factor take 4 capsules  bid     sacubitril-valsartan (ENTRESTO) 97-103 MG Take 1 tablet by mouth 2 (two) times daily. 60 tablet 0   spironolactone (ALDACTONE) 25 MG tablet Take 1 tablet (25 mg total) by mouth daily. 90 tablet 3   No current facility-administered medications for this visit.    Allergies:   Patient has no known allergies.    Social History:  The patient  reports that he quit smoking about 14 years ago. His smoking use included cigarettes. He has never used smokeless tobacco. He reports current alcohol use. He reports that he does not use drugs.   Family History:  The patient's family history includes CAD in his brother, brother, father, mother, and sister; Diabetes in his father; Heart disease in his brother.    ROS:  Please see the history of present illness.   Otherwise, review of systems are positive for difficulty losing  weight.   All other systems are reviewed and negative.    PHYSICAL EXAM: VS:  BP 138/64   Pulse (!) 58   Ht '6\' 2"'$  (1.88 m)   Wt 211 lb 6.4 oz (95.9 kg)   SpO2 98%   BMI 27.14 kg/m  , BMI Body mass index is 27.14 kg/m. GEN: Well nourished, well developed, in no acute distress HEENT: normal Neck: no JVD, carotid bruits, or masses Cardiac: RRR; no murmurs, rubs, or gallops,no edema  Respiratory:  clear to auscultation bilaterally, normal work of breathing GI: soft, nontender, nondistended, + BS MS: no deformity or atrophy Skin: warm and dry, no rash Neuro:  Strength and sensation are intact Psych: euthymic mood, full affect    Recent Labs: 09/06/2021: BUN 17; Creatinine, Ser 1.05; Potassium 4.9; Sodium 139 12/07/2021: Hemoglobin 14.4; Platelets 153   Lipid Panel    Component Value Date/Time   CHOL 103 05/10/2020 0923   TRIG 54 05/10/2020 0923   HDL 47 05/10/2020 0923   CHOLHDL 2.2 05/10/2020 0923   CHOLHDL 2.4 02/14/2016 0922   VLDL 13 02/14/2016 0922   LDLCALC 43 05/10/2020 0923     Other studies Reviewed: Additional studies/ records that were reviewed today with results demonstrating: October 2023 total cholesterol 124 HDL 49 LDL 52 triglycerides 129.   ASSESSMENT AND PLAN:  Chronic systolic heart failure: Appears euvolemic.  Tolerating Entresto and spironolactone.  Most recent lab work in October 2023 showed normal electrolytes.  He will need routine follow-up lab work with his primary care doctor. Coronary artery disease/old MI: No angina.  Prior LAD stent in the setting of cardiac arrest.  Whole food, plant-based diet recommended.  He does not eat red meat very often.  Continue regular exercise. ICD/ischemic cardiomyopathy: Followed with EP. Hyperlipidemia: The current medical regimen is effective;  continue present plan and medications.  COntinue atorvastatin.  HTN: The current medical regimen is effective;  continue present plan and medications.     Current  medicines are reviewed at length with the patient today.  The patient concerns regarding his medicines were addressed.  The following changes have been made:  No change  Labs/ tests ordered today include:  No orders of the defined types were placed in this encounter.   Recommend 150 minutes/week of aerobic exercise Low fat, low carb, high fiber diet recommended  Disposition:   FU in 1 year   Signed, Larae Grooms, MD  06/26/2022 11:53 AM    [facility] [address], [place], [place]  [address] Phone: [phone]; Fax: [phone]

## 2022-06-26 ENCOUNTER — Ambulatory Visit: Payer: PPO | Attending: Interventional Cardiology | Admitting: Interventional Cardiology

## 2022-06-26 ENCOUNTER — Encounter: Payer: Self-pay | Admitting: Interventional Cardiology

## 2022-06-26 VITALS — BP 138/64 | HR 58 | Ht 74.0 in | Wt 211.4 lb

## 2022-06-26 DIAGNOSIS — I255 Ischemic cardiomyopathy: Secondary | ICD-10-CM | POA: Diagnosis not present

## 2022-06-26 DIAGNOSIS — I252 Old myocardial infarction: Secondary | ICD-10-CM

## 2022-06-26 DIAGNOSIS — Z95810 Presence of automatic (implantable) cardiac defibrillator: Secondary | ICD-10-CM | POA: Diagnosis not present

## 2022-06-26 DIAGNOSIS — I5022 Chronic systolic (congestive) heart failure: Secondary | ICD-10-CM | POA: Diagnosis not present

## 2022-06-26 DIAGNOSIS — I25118 Atherosclerotic heart disease of native coronary artery with other forms of angina pectoris: Secondary | ICD-10-CM | POA: Diagnosis not present

## 2022-06-26 NOTE — Patient Instructions (Signed)

## 2022-06-27 NOTE — Telephone Encounter (Signed)
**Note De-Identified Linwood Gullikson Obfuscation** Letter received from [employer] stating that they have approved the pt for Entresto assistance until 06/05/2023. Pt ID: [id number]  The letter states that they have notified the pt of this approval as well.

## 2022-07-04 NOTE — Progress Notes (Signed)
Remote ICD transmission.   

## 2022-07-24 ENCOUNTER — Other Ambulatory Visit: Payer: Self-pay | Admitting: Interventional Cardiology

## 2022-08-07 ENCOUNTER — Other Ambulatory Visit: Payer: Self-pay | Admitting: Interventional Cardiology

## 2022-08-24 ENCOUNTER — Other Ambulatory Visit: Payer: Self-pay | Admitting: Interventional Cardiology

## 2022-09-15 ENCOUNTER — Ambulatory Visit (INDEPENDENT_AMBULATORY_CARE_PROVIDER_SITE_OTHER): Payer: PPO

## 2022-09-15 DIAGNOSIS — I255 Ischemic cardiomyopathy: Secondary | ICD-10-CM | POA: Diagnosis not present

## 2022-09-15 LAB — CUP PACEART REMOTE DEVICE CHECK
Battery Remaining Longevity: 47 mo
Battery Remaining Percentage: 45 %
Battery Voltage: 2.93 V
Brady Statistic RV Percent Paced: 1 %
Date Time Interrogation Session: 20240412020019
HighPow Impedance: 72 Ohm
HighPow Impedance: 72 Ohm
Implantable Lead Connection Status: 753985
Implantable Lead Connection Status: 753985
Implantable Lead Implant Date: 20100527
Implantable Lead Implant Date: 20100527
Implantable Lead Location: 753859
Implantable Lead Location: 753860
Implantable Pulse Generator Implant Date: 20180202
Lead Channel Impedance Value: 330 Ohm
Lead Channel Pacing Threshold Amplitude: 0.75 V
Lead Channel Pacing Threshold Pulse Width: 0.6 ms
Lead Channel Sensing Intrinsic Amplitude: 11.7 mV
Lead Channel Setting Pacing Amplitude: 2.5 V
Lead Channel Setting Pacing Pulse Width: 0.6 ms
Lead Channel Setting Sensing Sensitivity: 0.5 mV
Pulse Gen Serial Number: 7402114

## 2022-10-19 NOTE — Progress Notes (Signed)
Remote ICD transmission.   

## 2022-11-01 ENCOUNTER — Other Ambulatory Visit (HOSPITAL_COMMUNITY): Payer: Self-pay

## 2022-11-01 ENCOUNTER — Encounter (HOSPITAL_COMMUNITY): Payer: Self-pay | Admitting: Emergency Medicine

## 2022-11-01 ENCOUNTER — Other Ambulatory Visit: Payer: Self-pay | Admitting: Cardiology

## 2022-11-01 ENCOUNTER — Telehealth: Payer: Self-pay | Admitting: Interventional Cardiology

## 2022-11-01 ENCOUNTER — Other Ambulatory Visit: Payer: Self-pay

## 2022-11-01 ENCOUNTER — Emergency Department (HOSPITAL_COMMUNITY): Payer: PPO

## 2022-11-01 ENCOUNTER — Emergency Department (HOSPITAL_COMMUNITY)
Admission: EM | Admit: 2022-11-01 | Discharge: 2022-11-01 | Disposition: A | Discharge status: Home / Self Care | Payer: PPO | Attending: Emergency Medicine | Admitting: Emergency Medicine

## 2022-11-01 DIAGNOSIS — I480 Paroxysmal atrial fibrillation: Secondary | ICD-10-CM | POA: Diagnosis not present

## 2022-11-01 DIAGNOSIS — R0602 Shortness of breath: Secondary | ICD-10-CM | POA: Diagnosis present

## 2022-11-01 DIAGNOSIS — I2510 Atherosclerotic heart disease of native coronary artery without angina pectoris: Secondary | ICD-10-CM

## 2022-11-01 DIAGNOSIS — I509 Heart failure, unspecified: Secondary | ICD-10-CM | POA: Diagnosis not present

## 2022-11-01 DIAGNOSIS — R55 Syncope and collapse: Secondary | ICD-10-CM | POA: Diagnosis not present

## 2022-11-01 DIAGNOSIS — Z7982 Long term (current) use of aspirin: Secondary | ICD-10-CM | POA: Insufficient documentation

## 2022-11-01 DIAGNOSIS — Z7901 Long term (current) use of anticoagulants: Secondary | ICD-10-CM | POA: Diagnosis not present

## 2022-11-01 DIAGNOSIS — D72819 Decreased white blood cell count, unspecified: Secondary | ICD-10-CM | POA: Insufficient documentation

## 2022-11-01 DIAGNOSIS — I4891 Unspecified atrial fibrillation: Secondary | ICD-10-CM | POA: Insufficient documentation

## 2022-11-01 DIAGNOSIS — I5042 Chronic combined systolic (congestive) and diastolic (congestive) heart failure: Secondary | ICD-10-CM

## 2022-11-01 DIAGNOSIS — I4892 Unspecified atrial flutter: Secondary | ICD-10-CM

## 2022-11-01 DIAGNOSIS — Z79899 Other long term (current) drug therapy: Secondary | ICD-10-CM | POA: Insufficient documentation

## 2022-11-01 HISTORY — DX: Atherosclerotic heart disease of native coronary artery without angina pectoris: I25.10

## 2022-11-01 LAB — URINALYSIS, ROUTINE W REFLEX MICROSCOPIC
Bilirubin Urine: NEGATIVE
Glucose, UA: NEGATIVE mg/dL
Ketones, ur: 20 mg/dL — AB
Leukocytes,Ua: NEGATIVE
Nitrite: NEGATIVE
Protein, ur: 100 mg/dL — AB
Specific Gravity, Urine: 1.019 (ref 1.005–1.030)
pH: 5 (ref 5.0–8.0)

## 2022-11-01 LAB — CBC
HCT: 44 % (ref 39.0–52.0)
Hemoglobin: 14.8 g/dL (ref 13.0–17.0)
MCH: 32.3 pg (ref 26.0–34.0)
MCHC: 33.6 g/dL (ref 30.0–36.0)
MCV: 96.1 fL (ref 80.0–100.0)
Platelets: 158 10*3/uL (ref 150–400)
RBC: 4.58 MIL/uL (ref 4.22–5.81)
RDW: 12.8 % (ref 11.5–15.5)
WBC: 3.9 10*3/uL — ABNORMAL LOW (ref 4.0–10.5)
nRBC: 0 % (ref 0.0–0.2)

## 2022-11-01 LAB — HEPATIC FUNCTION PANEL
ALT: 16 U/L (ref 0–44)
AST: 18 U/L (ref 15–41)
Albumin: 3 g/dL — ABNORMAL LOW (ref 3.5–5.0)
Alkaline Phosphatase: 70 U/L (ref 38–126)
Bilirubin, Direct: 0.2 mg/dL (ref 0.0–0.2)
Indirect Bilirubin: 0.7 mg/dL (ref 0.3–0.9)
Total Bilirubin: 0.9 mg/dL (ref 0.3–1.2)
Total Protein: 5.7 g/dL — ABNORMAL LOW (ref 6.5–8.1)

## 2022-11-01 LAB — BASIC METABOLIC PANEL
Anion gap: 10 (ref 5–15)
BUN: 13 mg/dL (ref 8–23)
CO2: 22 mmol/L (ref 22–32)
Calcium: 9 mg/dL (ref 8.9–10.3)
Chloride: 104 mmol/L (ref 98–111)
Creatinine, Ser: 1.43 mg/dL — ABNORMAL HIGH (ref 0.61–1.24)
GFR, Estimated: 52 mL/min — ABNORMAL LOW (ref >60)
Glucose, Bld: 126 mg/dL — ABNORMAL HIGH (ref 70–99)
Potassium: 4.3 mmol/L (ref 3.5–5.1)
Sodium: 136 mmol/L (ref 135–145)

## 2022-11-01 LAB — TROPONIN I (HIGH SENSITIVITY)
Troponin I (High Sensitivity): 8 ng/L (ref <18)
Troponin I (High Sensitivity): 25 ng/L — ABNORMAL HIGH (ref <18)

## 2022-11-01 LAB — BRAIN NATRIURETIC PEPTIDE: B Natriuretic Peptide: 125.8 pg/mL — ABNORMAL HIGH (ref 0.0–100.0)

## 2022-11-01 LAB — MAGNESIUM: Magnesium: 1.8 mg/dL (ref 1.7–2.4)

## 2022-11-01 LAB — CBG MONITORING, ED: Glucose-Capillary: 127 mg/dL — ABNORMAL HIGH (ref 70–99)

## 2022-11-01 MED ORDER — HEPARIN (PORCINE) 25000 UT/250ML-% IV SOLN
1450.0000 [IU]/h | INTRAVENOUS | Status: DC
  Administered 2022-11-01: 1450 [IU]/h via INTRAVENOUS
  Filled 2022-11-01: qty 250

## 2022-11-01 MED ORDER — APIXABAN 5 MG PO TABS
5.0000 mg | ORAL_TABLET | Freq: Two times a day (BID) | ORAL | Status: DC
  Administered 2022-11-01: 5 mg via ORAL
  Filled 2022-11-01: qty 1

## 2022-11-01 MED ORDER — AMIODARONE HCL IN DEXTROSE 360-4.14 MG/200ML-% IV SOLN: 30.0000 mg/h | INTRAVENOUS | Status: DC

## 2022-11-01 MED ORDER — HEPARIN BOLUS VIA INFUSION
4800.0000 [IU] | Freq: Once | INTRAVENOUS | Status: AC
  Administered 2022-11-01: 4800 [IU] via INTRAVENOUS
  Filled 2022-11-01: qty 4800

## 2022-11-01 MED ORDER — APIXABAN 5 MG PO TABS: 5.0000 mg | ORAL_TABLET | Freq: Two times a day (BID) | ORAL | 6 refills | Status: DC

## 2022-11-01 MED ORDER — AMIODARONE LOAD VIA INFUSION
150.0000 mg | Freq: Once | INTRAVENOUS | Status: AC
  Administered 2022-11-01: 150 mg via INTRAVENOUS
  Filled 2022-11-01: qty 83.34

## 2022-11-01 MED ORDER — CARVEDILOL 3.125 MG PO TABS: ORAL_TABLET | ORAL | 3 refills | Status: DC

## 2022-11-01 MED ORDER — AMIODARONE HCL IN DEXTROSE 360-4.14 MG/200ML-% IV SOLN
60.0000 mg/h | INTRAVENOUS | Status: DC
  Administered 2022-11-01: 60 mg/h via INTRAVENOUS
  Filled 2022-11-01: qty 200

## 2022-11-01 MED ORDER — ENTRESTO 97-103 MG PO TABS: 0.5000 | ORAL_TABLET | Freq: Two times a day (BID) | ORAL | 0 refills | Status: DC

## 2022-11-01 NOTE — TOC Benefit Eligibility Note (Signed)
Patient Advocate Encounter  Insurance verification completed.    The patient is currently admitted and upon discharge could be taking Eliquis 5 mg.  The current 30 day co-pay is $47.00.   The patient is insured through [employer] Medicare Part D   This test claim was processed through [facility]- copay amounts may vary at other pharmacies due to pharmacy/plan contracts, or as the patient moves through the different stages of their insurance plan.  Weber Monnier, CPHT Pharmacy Patient Advocate Specialist [facility] Patient Advocate Team Direct Number: [phone]  Fax: [phone]       

## 2022-11-01 NOTE — Discharge Instructions (Addendum)
Cardiology recommends the following: -Double your dose of carvedilol to 2 tablets twice a day. -Decrease your dose of Entresto to 1/2 tablet twice a day. -Begin Eliquis, 5 mg twice a day. -Follow-up with them in the office.  Return to the emergency department at anytime for any new or worsening symptoms of concern.  _______________________________________________________________________________________________________________________________ Information on my medicine - ELIQUIS (apixaban)  This medication education was reviewed with me or my healthcare representative as part of my discharge preparation.  The pharmacist that spoke with me during my hospital stay was:  Doristine Counter, [facility]  Why was Eliquis prescribed for you? Eliquis was prescribed for you to reduce the risk of a blood clot forming that can cause a stroke if you have a medical condition called atrial fibrillation (a type of irregular heartbeat).  What do You need to know about Eliquis ? Take your Eliquis TWICE DAILY - one tablet in the morning and one tablet in the evening with or without food. If you have difficulty swallowing the tablet whole please discuss with your pharmacist how to take the medication safely.  Take Eliquis exactly as prescribed by your doctor and DO NOT stop taking Eliquis without talking to the doctor who prescribed the medication.  Stopping may increase your risk of developing a stroke.  Refill your prescription before you run out.  After discharge, you should have regular check-up appointments with your healthcare provider that is prescribing your Eliquis.  In the future your dose may need to be changed if your kidney function or weight changes by a significant amount or as you get older.  What do you do if you miss a dose? If you miss a dose, take it as soon as you remember on the same day and resume taking twice daily.  Do not take more than one dose of ELIQUIS at the same time to make up a  missed dose.  Important Safety Information A possible side effect of Eliquis is bleeding. You should call your healthcare provider right away if you experience any of the following: Bleeding from an injury or your nose that does not stop. Unusual colored urine (red or dark brown) or unusual colored stools (red or black). Unusual bruising for unknown reasons. A serious fall or if you hit your head (even if there is no bleeding).  Some medicines may interact with Eliquis and might increase your risk of bleeding or clotting while on Eliquis. To help avoid this, consult your healthcare provider or pharmacist prior to using any new prescription or non-prescription medications, including herbals, vitamins, non-steroidal anti-inflammatory drugs (NSAIDs) and supplements.  This website has more information on Eliquis (apixaban): http://www.eliquis.com/eliquis/home

## 2022-11-01 NOTE — Consult Note (Signed)
Cardiology Consultation   Patient ID: Mike Cole MRN: [id number]; DOB: 14-Dec-1950  Admit date: 11/01/2022 Date of Consult: 11/01/2022  PCP:  Collene Mares, PA    [facility] Providers Cardiologist:  Lance Muss, MD        Patient Profile:   Mike Cole is a 72 y.o. male with a hx of CAD, anterior MI complicated by cardiac arrest, ischemic cardiomyopathy with LVEF 25-30%, s/p dual chamber ICD, sinus node dysfunction, hyperlipidemia, transient ischemic attack who is being seen 11/01/2022 for the evaluation of atrial flutter with RVR, at the request of Dr. Durwin Nora.  History of Present Illness:   Mike Cole presented to the ED this morning after he developed sudden onset rapid HR, palpitations with associated lightheadedness/weakness and some dyspnea. Patient describes the sensation as "like my heart was wobbling." Patient denies any chest pain. Denies changes to normal AM regimen and felt fine when he first woke up. When symptoms started, he attempted to manage by taking a shower but when symptoms persisted, he called his wife who then transported him to the ED. He admits to feeling mildly worn down over the past week and has developed afternoon headache on a couple occasions. Otherwise reports feeling normal. He does admit to heavy exertion last Monday and Tuesday while working on his roof, possible this prompted feelings of fatigue. He denies any difficulty with completing this work and has not had any exertional dyspnea or chest pain. Also denies orthopnea, weight gain, lower extremity edema.    Past Medical History:  Diagnosis Date   Acute anterior myocardial infarction (HCC)    BMS to the LAD   Cardiogenic shock (HCC)    resolved   Coronary artery disease    Dyslipidemia    History of transient ischemic attack    Ischemic cardiomyopathy    EF of 20-25%   Ventricular tachycardia (HCC)    arrest, resuscitated    Past Surgical History:  Procedure  Laterality Date   EP IMPLANTABLE DEVICE N/A 07/07/2016   Procedure: ICD Generator Changeout;  Surgeon: Duke Salvia, MD;  Location: [facility] INVASIVE CV LAB;  Service: Cardiovascular;  Laterality: N/A;   St Jude CURRENT + DR  10/30/2008     Home Medications:  Prior to Admission medications   Medication Sig Start Date End Date Taking? Authorizing Provider  aspirin EC 81 MG tablet Take 81 mg by mouth daily.    [provider]  atorvastatin (LIPITOR) 40 MG tablet TAKE 1 TABLET BY MOUTH EVERY DAY AT [facility] 08/24/22   Corky Crafts, MD  carvedilol (COREG) 3.125 MG tablet TAKE 1 TABLET BY MOUTH TWICE A DAY WITH MEALS NEEDS APPT FOR REFILLS 08/07/22   Corky Crafts, MD  Lecith-Inosi-Chol-B12-Liver (LIVERITE) 1.125 MCG TABS Take 1.125 mcg by mouth 2 (two) times daily.    [provider]  NON FORMULARY Relief Factor take 4 capsules bid    [provider]  sacubitril-valsartan (ENTRESTO) 97-103 MG Take 1 tablet by mouth 2 (two) times daily. 06/08/22   Corky Crafts, MD  spironolactone (ALDACTONE) 25 MG tablet TAKE 1 TABLET (25 MG TOTAL) BY MOUTH DAILY. 07/24/22   Corky Crafts, MD    Inpatient Medications: Scheduled Meds:  apixaban  5 mg Oral BID   Continuous Infusions:   PRN Meds:   Allergies:   No Known Allergies  Social History:   Social History   Socioeconomic History   Marital status: Married    Spouse name:  Not on file   Number of children: Not on file   Years of education: Not on file   Highest education level: Not on file  Occupational History   Not on file  Tobacco Use   Smoking status: Former    Types: Cigarettes    Quit date: 06/05/2008    Years since quitting: 14.4   Smokeless tobacco: Never  Vaping Use   Vaping Use: Never used  Substance and Sexual Activity   Alcohol use: Yes    Comment: occ   Drug use: No   Sexual activity: Not on file  Other Topics Concern   Not on file  Social History Narrative   Not on file    Social Determinants of Health   Financial Resource Strain: Not on file  Food Insecurity: Not on file  Transportation Needs: Not on file  Physical Activity: Not on file  Stress: Not on file  Social Connections: Not on file  Intimate Partner Violence: Not on file    Family History:    Family History  Problem Relation Age of Onset   CAD Mother    CAD Father    Diabetes Father    CAD Sister    CAD Brother    Heart disease Brother        30's, quadruple bypass   CAD Brother      ROS:  Please see the history of present illness.   All other ROS reviewed and negative.     Physical Exam/Data:   Vitals:   11/01/22 1000 11/01/22 1020 11/01/22 1100 11/01/22 1110  BP: 105/62 96/60 (!) 99/58 94/66  Pulse: 61 64 65 (!) 54  Resp: 12 20 11 20   Temp:      SpO2: 100% 100% 100% 100%  Weight:      Height:       No intake or output data in the 24 hours ending 11/01/22 1129    11/01/2022    7:54 AM 06/26/2022   11:40 AM 12/07/2021    1:50 PM  Last 3 Weights  Weight (lbs) 210 lb 211 lb 6.4 oz 211 lb 12.8 oz  Weight (kg) 95.255 kg 95.89 kg 96.072 kg     Body mass index is 26.96 kg/m.  General:  Well nourished, well developed, in no acute distress HEENT: normal Neck: no JVD Vascular: No carotid bruits; Distal pulses 2+ bilaterally Cardiac:  normal S1, S2; RRR; no murmur  Lungs:  clear to auscultation bilaterally, no wheezing, rhonchi or rales  Abd: soft, nontender, no hepatomegaly  Ext: no edema Musculoskeletal:  No deformities, BUE and BLE strength normal and equal Skin: warm and dry  Neuro:  CNs 2-12 intact, no focal abnormalities noted Psych:  Normal affect   EKG:  The EKG was personally reviewed and demonstrates:  initial ECG with coarse atrial flutter with RVR. Repeat with NSR at 69 BPM, isolated PVC.  Telemetry:  Telemetry was personally reviewed and demonstrates:  atrial fibrillation with RVR, ventricular rate 139 bpm. Patient noted to be in NSR when reconnected with  telemetry at 9:50am.   Relevant CV Studies:  12/07/21 TTE  IMPRESSIONS     1. Left ventricular ejection fraction, by estimation, is 25 to 30%. The  left ventricle has severely decreased function. The left ventricle  demonstrates regional wall motion abnormalities with mid to apical  anterior, anteroseptal, and inferoseptal  akinesis and apical akinesis. The left ventricular internal cavity size  was mildly dilated. Left ventricular diastolic parameters are consistent  with Grade I diastolic dysfunction (impaired relaxation).   2. Right ventricular systolic function is normal. The right ventricular  size is normal. There is normal pulmonary artery systolic pressure. The  estimated right ventricular systolic pressure is 26.0 mmHg.   3. Left atrial size was mildly dilated.   4. Right atrial size was mildly dilated.   5. The mitral valve is normal in structure. Mild mitral valve  regurgitation. No evidence of mitral stenosis.   6. The aortic valve is tricuspid. Aortic valve regurgitation is trivial.  No aortic stenosis is present.   7. The inferior vena cava is normal in size with greater than 50%  respiratory variability, suggesting right atrial pressure of 3 mmHg.   FINDINGS   Left Ventricle: Left ventricular ejection fraction, by estimation, is 25  to 30%. The left ventricle has severely decreased function. The left  ventricle demonstrates regional wall motion abnormalities. The left  ventricular internal cavity size was mildly   dilated. There is no left ventricular hypertrophy. Left ventricular  diastolic parameters are consistent with Grade I diastolic dysfunction  (impaired relaxation).   Right Ventricle: The right ventricular size is normal. No increase in  right ventricular wall thickness. Right ventricular systolic function is  normal. There is normal pulmonary artery systolic pressure. The tricuspid  regurgitant velocity is 2.40 m/s, and   with an assumed right atrial  pressure of 3 mmHg, the estimated right  ventricular systolic pressure is 26.0 mmHg.   Left Atrium: Left atrial size was mildly dilated.   Right Atrium: Right atrial size was mildly dilated.   Pericardium: There is no evidence of pericardial effusion.   Mitral Valve: The mitral valve is normal in structure. Mild mitral valve  regurgitation. No evidence of mitral valve stenosis.   Tricuspid Valve: The tricuspid valve is normal in structure. Tricuspid  valve regurgitation is mild.   Aortic Valve: The aortic valve is tricuspid. Aortic valve regurgitation is  trivial. Aortic regurgitation PHT measures 513 msec. No aortic stenosis is  present.   Pulmonic Valve: The pulmonic valve was normal in structure. Pulmonic valve  regurgitation is trivial.   Aorta: The aortic root is normal in size and structure.   Venous: The inferior vena cava is normal in size with greater than 50%  respiratory variability, suggesting right atrial pressure of 3 mmHg.   IAS/Shunts: No atrial level shunt detected by color flow Doppler.   Additional Comments: A device lead is visualized in the right ventricle.    Laboratory Data:  High Sensitivity Troponin:   Recent Labs  Lab 11/01/22 0806  TROPONINIHS 8     Chemistry Recent Labs  Lab 11/01/22 0806 11/01/22 0831  NA 136  --   K 4.3  --   CL 104  --   CO2 22  --   GLUCOSE 126*  --   BUN 13  --   CREATININE 1.43*  --   CALCIUM 9.0  --   MG  --  1.8  GFRNONAA 52*  --   ANIONGAP 10  --     Recent Labs  Lab 11/01/22 0831  PROT 5.7*  ALBUMIN 3.0*  AST 18  ALT 16  ALKPHOS 70  BILITOT 0.9   Lipids No results for input(s): "CHOL", "TRIG", "HDL", "LABVLDL", "LDLCALC", "CHOLHDL" in the last 168 hours.  Hematology Recent Labs  Lab 11/01/22 0806  WBC 3.9*  RBC 4.58  HGB 14.8  HCT 44.0  MCV 96.1  MCH 32.3  MCHC 33.6  RDW 12.8  PLT 158   Thyroid No results for input(s): "TSH", "FREET4" in the last 168 hours.  BNPNo results for  input(s): "BNP", "PROBNP" in the last 168 hours.  DDimer No results for input(s): "DDIMER" in the last 168 hours.   Radiology/Studies:  CT HEAD WO CONTRAST  Result Date: 11/01/2022 CLINICAL DATA:  72 year old male with weakness, shortness of breath, fatigue, syncope/presyncope. EXAM: CT HEAD WITHOUT CONTRAST TECHNIQUE: Contiguous axial images were obtained from the base of the skull through the vertex without intravenous contrast. RADIATION DOSE REDUCTION: This exam was performed according to the departmental dose-optimization program which includes automated exposure control, adjustment of the mA and/or kV according to patient size and/or use of iterative reconstruction technique. COMPARISON:  None Available. FINDINGS: Brain: Cerebral volume is within normal limits for age. No midline shift, ventriculomegaly, mass effect, evidence of mass lesion, intracranial hemorrhage or evidence of cortically based acute infarction. Largely normal for age gray-white differentiation. Minimal white matter hypodensity. Vascular: Calcified atherosclerosis at the skull base. No suspicious intracranial vascular hyperdensity. Distal left vertebral artery appears dominant, normal variant. Skull: No acute osseous abnormality identified. Sinuses/Orbits: Visualized paranasal sinuses and mastoids are clear. Other: No acute orbit or scalp soft tissue finding. IMPRESSION: Normal for age noncontrast Head CT. Electronically Signed   By: Odessa Fleming M.D.   On: 11/01/2022 09:52   DG Chest Port 1 View  Result Date: 11/01/2022 CLINICAL DATA:  Provided history: Near syncope. EXAM: PORTABLE CHEST 1 VIEW COMPARISON:  Prior chest radiographs 11/03/2008 and earlier. FINDINGS: Left chest dual lead implantable cardiac device with leads terminating in the region of the right atrium and right ventricle. The lead terminating in the region of the right atrium has changed in position as compared to the prior examination of 11/03/2008. Heart size within  normal limits. No appreciable airspace consolidation or pulmonary edema. No evidence of pleural effusion or pneumothorax. No acute osseous abnormality identified. IMPRESSION: 1. Left chest dual lead implantable cardiac device with leads terminating in the region of the right atrium and right ventricle. Please note, the lead terminating in the region of the right atrium has changed in position as compared to the prior examination of 11/03/2008. Correlate with the procedure history. 2.  No evidence of an acute cardiopulmonary abnormality. Electronically Signed   By: Jackey Loge D.O.   On: 11/01/2022 08:55     Assessment and Plan:   New onset atrial fibrillation with RVR Secondary hypercoagulable state CHA2DS2-VASc Score = 6   Patient presented to the ED today with acute onset palpitations/rapid HR and was found to be in afib with RVR. Patient's ICD interrogated and shows irregularly irregular rhythm, rapid ventricular rate this morning around 6:30. This corresponds with onset of patient symptoms. No other occurrence since last device interrogation.  Patient initially with soft BP, rapid rate prompting administration of Amiodarone. He subsequently had return of NSR around 9:50 this morning. Appears to have converted while in CT.  Patient discussed with Dr. Royann Shivers and Dr. Graciela Husbands. Will stop Amiodarone and halve Entresto dose to allow up-titration of Coreg. Plan to double Carvedilol to 6.25mg  BID and increase as able in clinic follow up.  Given CHA2DS2-VASc Score = 6, patient will need OAC. Start Eliquis 5mg  BID Outpatient TTE scheduled for 5/31 prior to clinic follow up on 6/4.  Chronic HFrEF S/P ICD Hypertension  Patient with EF 25-30% on 12/07/21 TTE. NYHA class I and is without exertional limitations (worked on roof last week without issue).  Repeat  echocardiogram as above Reduce Entresto to 49-51mg  BID Increase Coreg to 6.25mg  BID as above Continue Spironolactone 25mg  Patient has previously  declined SGLT2. Could revisit initiation in outpatient setting. Continue EP follow up with Dr. Graciela Husbands for device monitoring  CAD  Patient with prior stent to LAD in setting of cardiac arrest (2010). Denies recent dyspnea or anginal symptoms.   Increase Coreg to 6.25mg  as above.  Will plan to stop ASA with initiation of Eliquis Continue Atorvastatin 40mg   Risk Assessment/Risk Scores:        New York Heart Association (NYHA) Functional Class NYHA Class I  CHA2DS2-VASc Score = 6   This indicates a 9.7% annual risk of stroke. The patient's score is based upon: CHF History: 1 HTN History: 1 Diabetes History: 0 Stroke History: 2 Vascular Disease History: 1 Age Score: 1 Gender Score: 0     [facility] will sign off.   Medication Recommendations:  Decrease Entresto, increase Coreg as above. Other recommendations (labs, testing, etc):  TTE scheduled Follow up as an outpatient:  6/4 at 10:05am with Eligha Bridegroom, NP.  For questions or updates, please contact  [facility] Please consult [email or web address] for contact info under    Signed, Reyden Balderston, PA-C  11/01/2022 11:29 AM  I have seen and examined the patient along with Perlie Gold, PA-C.  I have reviewed the chart, notes and new data.  I agree with PA/NP's note.  Key new complaints: Feels much better after conversion to sinus rhythm Key examination changes: no evidence of HF exacerbation/hypervolemia on exam Key new findings / data: mildly elevated creat, probably \\due  to relative hypovolemia (had GI upset after recent [employer]). Electrolytes and troponin are normal. Reviewed device download - had brief RVR> 200 bpm, at AFib onset, but too brief to trigger device charge/therapy. No active evidence of fluid overload by Corvue. CXR shows dislodged atrial lead (a known issue).  PLAN: Start anticoagulation w Eliquis 5 mg twice daily. Increase carvedilol to 6.25 mg twice daily for better  rate control, while lowering the Entrasto to 49/51 mg twice daily to avoid hypotension. If he develops symptomatic sinus bradycardia, can replace his dislodged atrial lead. OP echo and office follow up scheduled within a week.  Thurmon Fair, MD, [facility] [phone] 11/01/2022, 11:40 AM

## 2022-11-01 NOTE — ED Provider Notes (Signed)
[facility] AT [facility] Provider Note   CSN: [id number] Arrival date & time: 11/01/22  4098     History  Chief Complaint  Patient presents with   Shortness of Breath   Weakness   Fatigue    Mike Cole is a 72 y.o. male.   Shortness of Breath Weakness Associated symptoms: dizziness and shortness of breath   Patient presents for near syncope.  Medical history includes HLD, cardiomyopathy, sinus node dysfunction s/p [employer] Jude AICD placement, CHF.  Home medications include Coreg, Entresto, Aldactone.  1 week ago, patient was in his normal state of health.  He was doing some work on the roof on 2 half days in a row.  Since that time, he has had intermittent generalized weakness, dizziness, shortness of breath.  He will at times feel heart palpitations.  He denies any new medication changes.  Family describes unsteady gait at times.  Patient has not noticed that symptoms are worsened while standing versus sitting or laying down.  Currently, at rest, he states that he feels fine.     Home Medications Prior to Admission medications   Medication Sig Start Date End Date Taking? Authorizing Provider  aspirin EC 81 MG tablet Take 81 mg by mouth daily.    [provider]  atorvastatin (LIPITOR) 40 MG tablet TAKE 1 TABLET BY MOUTH EVERY DAY AT [facility] 08/24/22   Corky Crafts, MD  carvedilol (COREG) 3.125 MG tablet Double your dose to 2 tablets twice a day. 11/01/22   Gloris Manchester, MD  Lecith-Inosi-Chol-B12-Liver (LIVERITE) 1.125 MCG TABS Take 1.125 mcg by mouth 2 (two) times daily.    [provider]  NON FORMULARY Relief Factor take 4 capsules bid    [provider]  sacubitril-valsartan (ENTRESTO) 97-103 MG Take 0.5 tablets by mouth 2 (two) times daily. 11/01/22   Gloris Manchester, MD  spironolactone (ALDACTONE) 25 MG tablet TAKE 1 TABLET (25 MG TOTAL) BY MOUTH DAILY. 07/24/22   Corky Crafts, MD      Allergies    Patient  has no known allergies.    Review of Systems   Review of Systems  Constitutional:  Positive for fatigue.  Respiratory:  Positive for shortness of breath.   Cardiovascular:  Positive for palpitations.  Neurological:  Positive for dizziness, weakness (Generalized) and light-headedness.  All other systems reviewed and are negative.   Physical Exam Updated Vital Signs BP 99/72   Pulse (!) 51   Temp 98.1 F (36.7 C)   Resp 16   Ht 6\' 2"  (1.88 m)   Wt 95.3 kg   SpO2 98%   BMI 26.96 kg/m  Physical Exam Vitals and nursing note reviewed.  Constitutional:      General: He is not in acute distress.    Appearance: He is well-developed. He is not ill-appearing, toxic-appearing or diaphoretic.  HENT:     Head: Normocephalic and atraumatic.     Mouth/Throat:     Mouth: Mucous membranes are moist.  Eyes:     Conjunctiva/sclera: Conjunctivae normal.  Cardiovascular:     Rate and Rhythm: Tachycardia present. Rhythm irregular.     Heart sounds: No murmur heard. Pulmonary:     Effort: Pulmonary effort is normal. No tachypnea or respiratory distress.     Breath sounds: Normal breath sounds. No decreased breath sounds, wheezing, rhonchi or rales.  Chest:     Chest wall: No tenderness.  Abdominal:     Palpations: Abdomen is  soft.     Tenderness: There is no abdominal tenderness.  Musculoskeletal:        General: No swelling. Normal range of motion.     Cervical back: Normal range of motion and neck supple.     Right lower leg: No edema.     Left lower leg: No edema.  Skin:    General: Skin is warm and dry.     Coloration: Skin is not cyanotic or pale.  Neurological:     General: No focal deficit present.     Mental Status: He is alert and oriented to person, place, and time.     Cranial Nerves: No cranial nerve deficit.     Motor: No weakness.  Psychiatric:        Mood and Affect: Mood normal.        Behavior: Behavior normal.     ED Results / Procedures / Treatments    Labs (all labs ordered are listed, but only abnormal results are displayed) Labs Reviewed  BASIC METABOLIC PANEL - Abnormal; Notable for the following components:      Result Value   Glucose, Bld 126 (*)    Creatinine, Ser 1.43 (*)    GFR, Estimated 52 (*)    All other components within normal limits  CBC - Abnormal; Notable for the following components:   WBC 3.9 (*)    All other components within normal limits  URINALYSIS, ROUTINE W REFLEX MICROSCOPIC - Abnormal; Notable for the following components:   Color, Urine AMBER (*)    APPearance HAZY (*)    Hgb urine dipstick SMALL (*)    Ketones, ur 20 (*)    Protein, ur 100 (*)    Bacteria, UA RARE (*)    All other components within normal limits  HEPATIC FUNCTION PANEL - Abnormal; Notable for the following components:   Total Protein 5.7 (*)    Albumin 3.0 (*)    All other components within normal limits  BRAIN NATRIURETIC PEPTIDE - Abnormal; Notable for the following components:   B Natriuretic Peptide 125.8 (*)    All other components within normal limits  CBG MONITORING, ED - Abnormal; Notable for the following components:   Glucose-Capillary 127 (*)    All other components within normal limits  TROPONIN I (HIGH SENSITIVITY) - Abnormal; Notable for the following components:   Troponin I (High Sensitivity) 25 (*)    All other components within normal limits  MAGNESIUM  HEPARIN LEVEL (UNFRACTIONATED)  TROPONIN I (HIGH SENSITIVITY)    EKG EKG Interpretation  Date/Time:  Wednesday Nov 01 2022 07:55:46 EDT Ventricular Rate:  139 PR Interval:    QRS Duration: 82 QT Interval:  316 QTC Calculation: 480 R Axis:   68 Text Interpretation: Atrial fibrillation with rapid ventricular response Low voltage QRS Cannot rule out Anteroseptal infarct , age undetermined Abnormal ECG When compared with ECG of 07-Jul-2016 07:19, PREVIOUS ECG IS PRESENT Electronic atrial pacemaker is no longer firing Confirmed by Margarita Grizzle [phone]) on  11/01/2022 8:06:04 AM  Radiology CT HEAD WO CONTRAST  Result Date: 11/01/2022 CLINICAL DATA:  72 year old male with weakness, shortness of breath, fatigue, syncope/presyncope. EXAM: CT HEAD WITHOUT CONTRAST TECHNIQUE: Contiguous axial images were obtained from the base of the skull through the vertex without intravenous contrast. RADIATION DOSE REDUCTION: This exam was performed according to the departmental dose-optimization program which includes automated exposure control, adjustment of the mA and/or kV according to patient size and/or use of iterative reconstruction technique. COMPARISON:  None Available. FINDINGS: Brain: Cerebral volume is within normal limits for age. No midline shift, ventriculomegaly, mass effect, evidence of mass lesion, intracranial hemorrhage or evidence of cortically based acute infarction. Largely normal for age gray-white differentiation. Minimal white matter hypodensity. Vascular: Calcified atherosclerosis at the skull base. No suspicious intracranial vascular hyperdensity. Distal left vertebral artery appears dominant, normal variant. Skull: No acute osseous abnormality identified. Sinuses/Orbits: Visualized paranasal sinuses and mastoids are clear. Other: No acute orbit or scalp soft tissue finding. IMPRESSION: Normal for age noncontrast Head CT. Electronically Signed   By: Odessa Fleming M.D.   On: 11/01/2022 09:52   DG Chest Port 1 View  Result Date: 11/01/2022 CLINICAL DATA:  Provided history: Near syncope. EXAM: PORTABLE CHEST 1 VIEW COMPARISON:  Prior chest radiographs 11/03/2008 and earlier. FINDINGS: Left chest dual lead implantable cardiac device with leads terminating in the region of the right atrium and right ventricle. The lead terminating in the region of the right atrium has changed in position as compared to the prior examination of 11/03/2008. Heart size within normal limits. No appreciable airspace consolidation or pulmonary edema. No evidence of pleural effusion  or pneumothorax. No acute osseous abnormality identified. IMPRESSION: 1. Left chest dual lead implantable cardiac device with leads terminating in the region of the right atrium and right ventricle. Please note, the lead terminating in the region of the right atrium has changed in position as compared to the prior examination of 11/03/2008. Correlate with the procedure history. 2.  No evidence of an acute cardiopulmonary abnormality. Electronically Signed   By: Jackey Loge D.O.   On: 11/01/2022 08:55    Procedures Procedures    Medications Ordered in ED Medications  apixaban (ELIQUIS) tablet 5 mg (has no administration in time range)  amiodarone (NEXTERONE) 1.8 mg/mL load via infusion 150 mg (150 mg Intravenous Bolus from Bag 11/01/22 0859)  heparin bolus via infusion 4,800 Units (4,800 Units Intravenous Bolus from Bag 11/01/22 0914)    ED Course/ Medical Decision Making/ A&P                             Medical Decision Making Amount and/or Complexity of Data Reviewed Labs: ordered. Radiology: ordered.  Risk Prescription drug management.   This patient presents to the ED for concern of near syncopal symptoms, this involves an extensive number of treatment options, and is a complaint that carries with it a high risk of complications and morbidity.  The differential diagnosis includes arrhythmia, dehydration, polypharmacy, CHF, valvular dysfunction, CVA, TIA, ICH   Co morbidities that complicate the patient evaluation  HLD, cardiomyopathy, sinus node dysfunction s/p [employer] Jude AICD placement, CHF   Additional history obtained:  Additional history obtained from patient's wife External records from outside source obtained and reviewed including EMR   Lab Tests:  I Ordered, and personally interpreted labs.  The pertinent results include: Creatinine is increased when compared to 1 year ago.  Electrolytes are normal.  Hemoglobin is normal.  There is a slight leukopenia.  Troponin is  normal.   Imaging Studies ordered:  I ordered imaging studies including wrist x-ray, CT head I independently visualized and interpreted imaging which showed interval position changes of right atrial lead from cardiac device.  No other acute findings. I agree with the radiologist interpretation   Cardiac Monitoring: / EKG:  The patient was maintained on a cardiac monitor.  I personally viewed and interpreted the cardiac monitored which showed an  underlying rhythm of: Atrial fibrillation   Consultations Obtained:  I requested consultation with the cardiologist,  and discussed lab and imaging findings as well as pertinent plan - they recommend: Discharge with outpatient follow-up   Problem List / ED Course / Critical interventions / Medication management  Patient is a 72 year old male presenting for intermittent near syncopal symptoms over the past week.  Symptoms worsened this morning which prompted him to come to the ED.  On arrival, patient reports that his symptoms from this morning have resolved.  He is well-appearing on exam.  Vital signs are notable for tachycardia and soft blood pressures.  EKG shows atrial fibrillation with RVR.  Patient denies any history of the same.  Given his known heart failure (last known EF was 30 to 35%), in addition to his current blood pressures, will avoid AV nodal agents.  Amiodarone and heparin were ordered.  Cardiology was consulted.  While patient was undergoing bolus of amiodarone, he had improved heart rate and subsequently conversion to normal sinus rhythm.  With this, he had some slight improvement in his blood pressure and improved symptoms as well.  Lab work was unremarkable.  X-ray showed concern of possible displacement of right atrial lead.  Will attempt to interrogate [facility] device.  Cardiologist, Dr. Royann Shivers, came and evaluated the patient in the ED.  He discontinued heparin and amiodarone.  He started the patient on Eliquis.  He plans on  doubling patient's home dose of Coreg and decreasing home dose of Entresto.  Will arrange for close outpatient follow-up.  He does recommend discharge home.  Patient is very much in favor of this plan.  Patient was advised to return for any return of concerning symptoms.  He was discharged in stable condition. I ordered medication including amiodarone and heparin for atrial fibrillation with RVR Reevaluation of the patient after these medicines showed that the patient resolved I have reviewed the patients home medicines and have made adjustments as needed   Social Determinants of Health:  Has access to outpatient care  CRITICAL CARE Performed by: Gloris Manchester   Total critical care time: 32 minutes  Critical care time was exclusive of separately billable procedures and treating other patients.  Critical care was necessary to treat or prevent imminent or life-threatening deterioration.  Critical care was time spent personally by me on the following activities: development of treatment plan with patient and/or surrogate as well as nursing, discussions with consultants, evaluation of patient's response to treatment, examination of patient, obtaining history from patient or surrogate, ordering and performing treatments and interventions, ordering and review of laboratory studies, ordering and review of radiographic studies, pulse oximetry and re-evaluation of patient's condition.         Final Clinical Impression(s) / ED Diagnoses Final diagnoses:  Atrial fibrillation with RVR (HCC)    Rx / DC Orders ED Discharge Orders          Ordered    sacubitril-valsartan (ENTRESTO) 97-103 MG  2 times daily       Note to Pharmacy: Please keep upcoming appointment with Dr.Varanasi in January 2024 before any more refills.Thank you.   11/01/22 1219    carvedilol (COREG) 3.125 MG tablet        11/01/22 1219    Ambulatory referral to Cardiology       Comments: If you have not heard from the  Cardiology office within the next 72 hours please call [phone].   11/01/22 1220  Gloris Manchester, MD 11/01/22 [phone]

## 2022-11-01 NOTE — Telephone Encounter (Signed)
Patient seen by cardiology in hospital

## 2022-11-01 NOTE — Progress Notes (Signed)
ANTICOAGULATION CONSULT NOTE - Initial Consult  Pharmacy Consult for Heparin infusion Indication: atrial fibrillation  No Known Allergies  Patient Measurements: Height: 6\' 2"  (188 cm) Weight: 95.3 kg (210 lb) IBW/kg (Calculated) : 82.2 Heparin Dosing Weight: 95.3 kg  Vital Signs: Temp: 98.1 F (36.7 C) (05/29 0837) BP: 97/80 (05/29 0837) Pulse Rate: 133 (05/29 0837)  Labs: Recent Labs    11/01/22 0806  HGB 14.8  HCT 44.0  PLT 158    CrCl cannot be calculated (Patient's most recent lab result is older than the maximum 21 days allowed.).   Medical History: Past Medical History:  Diagnosis Date   Acute anterior myocardial infarction (HCC)    BMS to the LAD   Cardiogenic shock (HCC)    resolved   Coronary artery disease    Dyslipidemia    History of transient ischemic attack    Ischemic cardiomyopathy    EF of 20-25%   Ventricular tachycardia (HCC)    arrest, resuscitated    Medications:  (Not in a hospital admission)   Assessment: 72 yo M presents with weakness and palpitations and found to be in new-onset Afib w/ RVR. Patient was not taking any anticoagulants prior to arrival. Pharmacy consulted to dose and manage heparin infusion for Afib.  Hgb 14.8, Plt 158 - stable, WNL No s/sx of bleeding  Goal of Therapy:  Heparin level 0.3-0.7 units/ml Monitor platelets by anticoagulation protocol: Yes   Plan:  Give heparin 4800 units IV x1 bolus, then  Initiate heparin infusion at 1450 units/hr Check heparin level in 8 hours Monitor daily CBC, heparin level, and for s/sx of bleeding   Wilburn Cornelia, PharmD, BCPS Clinical Pharmacist 11/01/2022 9:04 AM   Please refer to [facility] for pharmacy phone number

## 2022-11-01 NOTE — ED Triage Notes (Addendum)
Pt. Stated, Mike Cole been feeling weak, and not feel like doing anything. Some SOB myalgias heart feels weird and all over the place.  Pt has a pacemaker and a defibrillator

## 2022-11-01 NOTE — Telephone Encounter (Signed)
Granddaughter called to notify Dr. Eldridge Dace patient has been admitted to [facility] hospital with symptoms of dizziness, increased BP, Afib and SOB.

## 2022-11-01 NOTE — ED Notes (Signed)
Got patient on the monitor patient is resting with call bell in reach and family at bedside °

## 2022-11-03 ENCOUNTER — Ambulatory Visit (HOSPITAL_COMMUNITY): Payer: PPO | Attending: Cardiology

## 2022-11-03 DIAGNOSIS — I4892 Unspecified atrial flutter: Secondary | ICD-10-CM

## 2022-11-03 LAB — ECHOCARDIOGRAM COMPLETE
Area-P 1/2: 3.78 cm2
MV M vel: 4.89 m/s
MV Peak grad: 95.6 mmHg
S' Lateral: 4.7 cm

## 2022-11-06 NOTE — Progress Notes (Unsigned)
Cardiology Office Note:    Date:  11/07/2022   ID:  Melynda Keller, DOB May 24, 1951, MRN [id number]  PCP:  Collene Mares, PA   [facility] Providers Cardiologist:  Lance Muss, MD     Referring MD: Hyacinth Meeker, [place], [place]   Chief Complaint: follow-up atrial fib  History of Present Illness:    Mike Cole is a very pleasant 72 y.o. male with a hx of CAD s/p anterior MI with BMS to LAD 2010 complicated by cardiac arrest and subsequent ischemic cardiomyopathy s/p AICD implant. Additional hx includes HLD, TIA,and HTN.   Generator change out February 2018 and he felt poorly after this.  It was difficult to walk.  He had his medications adjusted and he felt better. Per his report, one of his ICD leads is not attached.   He received a list of primary care providers in the area upon request in 2018.  He joined a gym and worked out on the treadmill and the elliptical 5 days a week as well as lifting weights and use of weight machines.  Exercise decreased during COVID.   Echocardiogram 2019 moderately dilated LV with EF moderately reduced 35 to 40%, akinesis of the mid apicalanteroseptal and apical myocardium.  Features are consistent with a pseudo normal left ventricular filling pattern with concomitant abnormal relaxation and increased filling pressure (G2 DD), mild MR, mild to moderate TR.  He was switched to [facility] in 2023, however EF did not improve.  Cardiology clinic visit was 06/26/2022 with Dr. Eldridge Dace at which time he had no significant cardiac symptoms and was advised to return in 1 year for follow-up.  ED admission 11/01/2022 after developing sudden rapid HR, palpitations associated with lightheadedness/weakness, and dyspnea. He reported sensation "like my heart was wobbling."  He denied chest pain.  He had recent heavy exertion a few days prior while working on his roof, prompted more feelings of fatigue.  He admitted feeling worn down over the previous week with  afternoon headaches on a couple of occasions.  He was found to be in A-fib with RVR.  His ICD interrogation revealed rapid ventricular rate that developed that morning around 6:30 AM.  This correlates with onset of symptoms.  He received amiodarone and subsequently had return of NSR around 950 that morning.  Plan discussed with cardiology to stop amiodarone and reduce Entresto dosage in order to uptitrate carvedilol.  CHA2DS2-VASc score of 6.  Patient was started on Eliquis 5 mg twice daily.  Underwent outpatient echocardiogram on 11/03/2022 which revealed LVEF 25 to 30%, no evidence of LV thrombus, basal to mid inferior septal and anterior septal akinesis, apical anterior/inferior/lateral akinesis, and akinesis of the true apex, G1DD, RVSP 30.9 mmHg, mild biatrial enlargement, mild MR.  Today, he is here for follow-up of new onset atrial fibrillation. He reports he is feeling poorly within a short time after taking carvedilol and Entresto, very fatigued. Has taken both medications for years, thinks it may be the increased dose of carvedilol. Admits he was feeling very poorly prior to hospital admission. Worked on his roof, dug a ditch, worked in the hot weather for hours without good hydration for 2 days in a row. Also got GI illness from eating at [place] at the same time. No previous hx of a fib. Resting HR consistently 58 bpm for 14 years.  He converted to NSR prior to leaving the hospital and has had no further episodes of A-fib to his awareness. Home blood  pressure and HR have been stable, no tachycardia. He denies chest pain, shortness of breath, lower extremity edema, palpitations, melena, hematuria, hemoptysis, diaphoresis, weakness, presyncope, syncope, orthopnea, and PND. Has a FitBit but does not know how to review HR readings. Daughter is a cardiac nurse.   Past Medical History:  Diagnosis Date   Acute anterior myocardial infarction (HCC)    BMS to the LAD   Cardiogenic shock (HCC)     resolved   Coronary artery disease    Dyslipidemia    History of transient ischemic attack    Ischemic cardiomyopathy    EF of 20-25%   Ventricular tachycardia (HCC)    arrest, resuscitated    Past Surgical History:  Procedure Laterality Date   EP IMPLANTABLE DEVICE N/A 07/07/2016   Procedure: ICD Generator Changeout;  Surgeon: Duke Salvia, MD;  Location: [facility] INVASIVE CV LAB;  Service: Cardiovascular;  Laterality: N/A;   St Jude CURRENT + DR  10/30/2008    Current Medications: Current Meds  Medication Sig   apixaban (ELIQUIS) 5 MG TABS tablet Take 1 tablet (5 mg total) by mouth 2 (two) times daily.   atorvastatin (LIPITOR) 40 MG tablet TAKE 1 TABLET BY MOUTH EVERY DAY AT 6PM   carvedilol (COREG) 3.125 MG tablet Take 1 tablet (3.125 mg total) by mouth 2 (two) times daily with a meal.   Lecith-Inosi-Chol-B12-Liver (LIVERITE) 1.125 MCG TABS Take 1.125 mcg by mouth 2 (two) times daily.   NON FORMULARY Relief Factor take 4 capsules bid   sacubitril-valsartan (ENTRESTO) 97-103 MG Take 1 tablet by mouth 2 (two) times daily.   spironolactone (ALDACTONE) 25 MG tablet TAKE 1 TABLET (25 MG TOTAL) BY MOUTH DAILY.   [DISCONTINUED] aspirin EC 81 MG tablet Take 81 mg by mouth daily.   [DISCONTINUED] carvedilol (COREG) 3.125 MG tablet Double your dose to 2 tablets twice a day.   [DISCONTINUED] sacubitril-valsartan (ENTRESTO) 97-103 MG Take 0.5 tablets by mouth 2 (two) times daily.     Allergies:   Patient has no known allergies.   Social History   Socioeconomic History   Marital status: Married    Spouse name: Not on file   Number of children: Not on file   Years of education: Not on file   Highest education level: Not on file  Occupational History   Not on file  Tobacco Use   Smoking status: Former    Types: Cigarettes    Quit date: 06/05/2008    Years since quitting: 14.4   Smokeless tobacco: Never  Vaping Use   Vaping Use: Never used  Substance and Sexual Activity   Alcohol use:  Yes    Comment: occ   Drug use: No   Sexual activity: Not on file  Other Topics Concern   Not on file  Social History Narrative   Not on file   Social Determinants of Health   Financial Resource Strain: Not on file  Food Insecurity: Not on file  Transportation Needs: Not on file  Physical Activity: Not on file  Stress: Not on file  Social Connections: Not on file     Family History: The patient's family history includes CAD in his brother, brother, father, mother, and sister; Diabetes in his father; Heart disease in his brother.  ROS:   Please see the history of present illness.   All other systems reviewed and are negative.  Labs/Other Studies Reviewed:    The following studies were reviewed today:  Echo 11/03/22 1. Left ventricular  ejection fraction, by estimation, is 25 to 30%. The  left ventricle has severely decreased function. The left ventricle  demonstrates regional wall motion abnormalities with basal to mid  inferoseptal and anteroseptal akinesis, apical  anterior/inferior/lateral akinesis, and akinesis of the true apex. No LV  thrombu noted. The left ventricular internal cavity size was mildly  dilated. Left ventricular diastolic parameters are consistent with Grade I  diastolic dysfunction (impaired  relaxation).   2. Right ventricular systolic function is normal. The right ventricular  size is normal. There is normal pulmonary artery systolic pressure. The  estimated right ventricular systolic pressure is 30.9 mmHg.   3. Left atrial size was mildly dilated.   4. Right atrial size was mildly dilated.   5. The mitral valve is normal in structure. Mild mitral valve  regurgitation. No evidence of mitral stenosis.   6. The aortic valve is tricuspid. Aortic valve regurgitation is trivial.  No aortic stenosis is present.   7. Aortic dilatation noted. There is mild dilatation of the ascending  aorta, measuring 38 mm.   8. The inferior vena cava is normal in size  with greater than 50%  respiratory variability, suggesting right atrial pressure of 3 mmHg.  Carotid Duplex 05/25/20 Summary:  Right Carotid: Velocities in the right ICA are consistent with a 1-39%  stenosis.                Non-hemodynamically significant plaque <50% noted in the  CCA.   Left Carotid: Velocities in the left ICA are consistent with a 1-39%  stenosis.               Non-hemodynamically significant plaque <50% noted in the  CCA.   Vertebrals: Bilateral vertebral arteries demonstrate antegrade flow.  Subclavians: Normal flow hemodynamics were seen in bilateral subclavian               arteries.   *See table(s) above for measurements and observations.     Recent Labs: 11/01/2022: ALT 16; B Natriuretic Peptide 125.8; BUN 13; Creatinine, Ser 1.43; Hemoglobin 14.8; Magnesium 1.8; Platelets 158; Potassium 4.3; Sodium 136  Recent Lipid Panel    Component Value Date/Time   CHOL 103 05/10/2020 0923   TRIG 54 05/10/2020 0923   HDL 47 05/10/2020 0923   CHOLHDL 2.2 05/10/2020 0923   CHOLHDL 2.4 02/14/2016 0922   VLDL 13 02/14/2016 0922   LDLCALC 43 05/10/2020 0923     Risk Assessment/Calculations:    CHA2DS2-VASc Score = 6   This indicates a 9.7% annual risk of stroke. The patient's score is based upon: CHF History: 1 HTN History: 1 Diabetes History: 0 Stroke History: 2 Vascular Disease History: 1 Age Score: 1 Gender Score: 0          Physical Exam:    VS:  BP 122/66   Pulse (!) 55   Ht 6\' 2"  (1.88 m)   Wt 212 lb (96.2 kg)   SpO2 98%   BMI 27.22 kg/m     Wt Readings from Last 3 Encounters:  11/07/22 212 lb (96.2 kg)  11/01/22 210 lb (95.3 kg)  06/26/22 211 lb 6.4 oz (95.9 kg)     GEN:  Well nourished, well developed in no acute distress HEENT: Normal NECK: No JVD; No carotid bruits CARDIAC: RRR, no murmurs, rubs, gallops RESPIRATORY:  Clear to auscultation without rales, wheezing or rhonchi  ABDOMEN: Soft, non-tender,  non-distended MUSCULOSKELETAL:  No edema; No deformity. 2+ pedal pulses, equal bilaterally SKIN: Warm and dry  NEUROLOGIC:  Alert and oriented x 3 PSYCHIATRIC:  Normal affect   EKG:  EKG is ordered today.  The ekg ordered today demonstrates sinus bradycardia at 55 bpm, low voltage QRS, poor R wave progression, cannot rule out anteroseptal infarct, no acute change from previous tracing       Diagnoses:    1. PAF (paroxysmal atrial fibrillation) (HCC)   2. Ischemic cardiomyopathy   3. Chronic combined systolic and diastolic heart failure (HCC)   4. Coronary artery disease involving native coronary artery of native heart without angina pectoris   5. ICD (implantable cardioverter-defibrillator) in place   6. Chronic anticoagulation   7. Dyslipidemia   8. Essential hypertension    Assessment and Plan:     PAF on chronic anticoagulation: He is maintaining sinus rhythm today. No return of AF to his awareness. HR is well controlled. Resting HR has been 58 bpm for many years. No tachypalpitations. Feeling poorly on higher dose of carvedilol, will resume 3.125 mg BID. Lengthy discussion about monitoring HR at home and taking additional carvedilol as needed for HR > 100 bpm. Call with questions or concerns. Continue Eliquis 5 mg BID which is appropriate dose for stroke prevention for CHA2DS2-VASc score of 6.  Chronic combined CHF/ICM: Mildly elevated BNP at 125.8 during admission 11/01/22. Echo 11/03/22 revealed LVEF 25 to 30%, wall motion abnormalities with basal to mid inferior septal and anteroseptal akinesis, apical anterior/inferior/lateral akinesis and akinesis of the true apex, no LV thrombus, G1DD, RVSP 30.9 mmHg, mild biatrial enlargement, mild MR. Normal right atrial pressure. No evidence of volume overload on exam. He denies dyspnea, orthopnea, PND, edema. Weight has been stable. He is feeling poorly on higher dose of carvedilol.  We will resume carvedilol 3.125 mg twice daily, and increase  Entresto back to 97-103 mg twice daily which was dosed prior to hospitalization.  Continue spironolactone we will recheck BMP in 2 weeks. Advised him to notify us if he develops s/s of CHF or if he has episodes of tachycardia that may induce volume overload. I will see him back in 2 months for close follow-up.  CAD without angina: S/p anterior MI with BMS to LAD 2010 complicated by cardiac arrest and subsequent ischemic cardiomyopathy s/p AICD implant. He denies chest pain, dyspnea, or other symptoms concerning for angina.  No indication for further ischemic evaluation at this time. Continue carvedilol, atorvastatin, Entresto, and spironolactone.   Hyperlipidemia: Goal LDL < 55. LDL 52 on 03/28/2022.  Continue atorvastatin.  Hypertension: Home BP 106-126/ 59-70, HR 45-59. Will have him reduce carvedilol to 3.125 mg BID for 3-4 days then increase Entresto back to 97-103 BID which were his standing doses prior to hospitalization. Advised him to notify us if he has concerns during the transition.   AICD Implant: Stable device function on remote device check 09/15/2022. No acute concerns today. Management per EP.      Disposition: 2 months with me (may cancel if he has no concerns)/Keep your Sept appointment with Dr. Graciela Husbands  Medication Adjustments/Labs and Tests Ordered: Current medicines are reviewed at length with the patient today.  Concerns regarding medicines are outlined above.  Orders Placed This Encounter  Procedures   EKG 12-Lead   Meds ordered this encounter  Medications   carvedilol (COREG) 3.125 MG tablet    Sig: Take 1 tablet (3.125 mg total) by mouth 2 (two) times daily with a meal.    Dispense:  180 tablet    Refill:  3   sacubitril-valsartan (  ENTRESTO) 97-103 MG    Sig: Take 1 tablet by mouth 2 (two) times daily.    Dispense:  60 tablet    Refill:  11    Patient Instructions  Medication Instructions:   CHANGE Carvedilol one (1) tablet by mouth ( 3.125 mg) twice daily.    CHANGE Entresto one (1) tablet by mouth (97-103 mg ) twice daily.   *If you need a refill on your cardiac medications before your next appointment, please call your pharmacy*   Lab Work:  None ordere.  If you have labs (blood work) drawn today and your tests are completely normal, you will receive your results only by: MyChart Message (if you have MyChart) OR A paper copy in the mail If you have any lab test that is abnormal or we need to change your treatment, we will call you to review the results.   Testing/Procedures:  None ordered.   Follow-Up: At [facility], you and your health needs are our priority.  As part of our continuing mission to provide you with exceptional heart care, we have created designated Provider Care Teams.  These Care Teams include your primary Cardiologist (physician) and Advanced Practice Providers (APPs -  Physician Assistants and Nurse Practitioners) who all work together to provide you with the care you need, when you need it.  We recommend signing up for the patient portal called "MyChart".  Sign up information is provided on this After Visit Summary.  MyChart is used to connect with patients for Virtual Visits (Telemedicine).  Patients are able to view lab/test results, encounter notes, upcoming appointments, etc.  Non-urgent messages can be sent to your provider as well.   To learn more about what you can do with MyChart, go to [email or web address].    Your next appointment:   2 month(s)  Provider:   Eligha Bridegroom, NP            Signed, Levi Aland, NP  11/07/2022 1:06 PM    [facility]

## 2022-11-07 ENCOUNTER — Encounter: Payer: Self-pay | Admitting: Nurse Practitioner

## 2022-11-07 ENCOUNTER — Ambulatory Visit (INDEPENDENT_AMBULATORY_CARE_PROVIDER_SITE_OTHER): Payer: PPO | Admitting: Nurse Practitioner

## 2022-11-07 VITALS — BP 122/66 | HR 55 | Ht 74.0 in | Wt 212.0 lb

## 2022-11-07 DIAGNOSIS — Z95810 Presence of automatic (implantable) cardiac defibrillator: Secondary | ICD-10-CM

## 2022-11-07 DIAGNOSIS — I480 Paroxysmal atrial fibrillation: Secondary | ICD-10-CM

## 2022-11-07 DIAGNOSIS — I255 Ischemic cardiomyopathy: Secondary | ICD-10-CM

## 2022-11-07 DIAGNOSIS — Z7901 Long term (current) use of anticoagulants: Secondary | ICD-10-CM

## 2022-11-07 DIAGNOSIS — I5042 Chronic combined systolic (congestive) and diastolic (congestive) heart failure: Secondary | ICD-10-CM

## 2022-11-07 DIAGNOSIS — E785 Hyperlipidemia, unspecified: Secondary | ICD-10-CM

## 2022-11-07 DIAGNOSIS — I2510 Atherosclerotic heart disease of native coronary artery without angina pectoris: Secondary | ICD-10-CM | POA: Diagnosis not present

## 2022-11-07 DIAGNOSIS — I10 Essential (primary) hypertension: Secondary | ICD-10-CM

## 2022-11-07 MED ORDER — ENTRESTO 97-103 MG PO TABS: 1.0000 | ORAL_TABLET | Freq: Two times a day (BID) | ORAL | 11 refills | Status: DC

## 2022-11-07 MED ORDER — CARVEDILOL 3.125 MG PO TABS: 3.1250 mg | ORAL_TABLET | Freq: Two times a day (BID) | ORAL | 3 refills | Status: DC

## 2022-11-07 NOTE — Patient Instructions (Signed)
Medication Instructions:   CHANGE Carvedilol one (1) tablet by mouth ( 3.125 mg) twice daily.   CHANGE Entresto one (1) tablet by mouth (97-103 mg ) twice daily.   *If you need a refill on your cardiac medications before your next appointment, please call your pharmacy*   Lab Work:  None ordere.  If you have labs (blood work) drawn today and your tests are completely normal, you will receive your results only by: MyChart Message (if you have MyChart) OR A paper copy in the mail If you have any lab test that is abnormal or we need to change your treatment, we will call you to review the results.   Testing/Procedures:  None ordered.   Follow-Up: At [facility], you and your health needs are our priority.  As part of our continuing mission to provide you with exceptional heart care, we have created designated Provider Care Teams.  These Care Teams include your primary Cardiologist (physician) and Advanced Practice Providers (APPs -  Physician Assistants and Nurse Practitioners) who all work together to provide you with the care you need, when you need it.  We recommend signing up for the patient portal called "MyChart".  Sign up information is provided on this After Visit Summary.  MyChart is used to connect with patients for Virtual Visits (Telemedicine).  Patients are able to view lab/test results, encounter notes, upcoming appointments, etc.  Non-urgent messages can be sent to your provider as well.   To learn more about what you can do with MyChart, go to [email or web address].    Your next appointment:   2 month(s)  Provider:   Eligha Bridegroom, NP

## 2022-11-17 NOTE — Telephone Encounter (Addendum)
Called patient.  He reports same symptoms as he had before the med change on 11/07/22.  Walked up and down the hall recently and HR went to 110 on his FitbIT.   It stays in 50s and then goes up with any activity at all.  When this happens he feels his heart racing, gets tired and short of breath.  HE thinks his symptoms may be worse but have been going on for quite a while.     Recent BP readings since med change (decrease of carvedilol back to original dose of 3.125 mg BID and increase of Entresto back to 97-103 mg):   11/12/22 7:35 am 121/66, 49 11/13/22 6:30 am 125/65, 58 2:39  123/56, 53 11/14/22  8:50 am  128/64, 71   1:30 pm 125/73, 57  5:55 pm 128/71 11/15/22  6:20 am 130/66, 58  10:15 am 121/57, 69 11/16/22  117/68, 1:00 pm 108/63, 10:15 pm 102/56  Pt aware I will forward to APP for review and will call with any new recommendations.

## 2022-11-20 ENCOUNTER — Other Ambulatory Visit: Payer: Self-pay | Admitting: *Deleted

## 2022-11-20 ENCOUNTER — Ambulatory Visit: Payer: PPO | Attending: Nurse Practitioner

## 2022-11-20 DIAGNOSIS — I480 Paroxysmal atrial fibrillation: Secondary | ICD-10-CM

## 2022-11-20 NOTE — Progress Notes (Unsigned)
Enrolled patient for a 7 day Zio XT monitor to be mailed to patients home  [place] to read

## 2022-11-21 ENCOUNTER — Ambulatory Visit: Payer: PPO | Attending: Nurse Practitioner

## 2022-11-21 DIAGNOSIS — I5042 Chronic combined systolic (congestive) and diastolic (congestive) heart failure: Secondary | ICD-10-CM

## 2022-11-21 DIAGNOSIS — I255 Ischemic cardiomyopathy: Secondary | ICD-10-CM

## 2022-11-21 LAB — BASIC METABOLIC PANEL
BUN/Creatinine Ratio: 10 (ref 10–24)
BUN: 12 mg/dL (ref 8–27)
CO2: 24 mmol/L (ref 20–29)
Calcium: 9.1 mg/dL (ref 8.6–10.2)
Chloride: 103 mmol/L (ref 96–106)
Creatinine, Ser: 1.17 mg/dL (ref 0.76–1.27)
Glucose: 115 mg/dL — ABNORMAL HIGH (ref 70–99)
Potassium: 4.4 mmol/L (ref 3.5–5.2)
Sodium: 139 mmol/L (ref 134–144)
eGFR: 67 mL/min/{1.73_m2} (ref >59)

## 2022-11-24 DIAGNOSIS — I480 Paroxysmal atrial fibrillation: Secondary | ICD-10-CM

## 2022-12-15 ENCOUNTER — Ambulatory Visit (INDEPENDENT_AMBULATORY_CARE_PROVIDER_SITE_OTHER): Payer: PPO

## 2022-12-15 DIAGNOSIS — I255 Ischemic cardiomyopathy: Secondary | ICD-10-CM

## 2022-12-15 LAB — CUP PACEART REMOTE DEVICE CHECK
Battery Remaining Longevity: 46 mo
Battery Remaining Percentage: 44 %
Battery Voltage: 2.92 V
Brady Statistic RV Percent Paced: 1 %
Date Time Interrogation Session: 20240712023344
HighPow Impedance: 72 Ohm
HighPow Impedance: 72 Ohm
Implantable Lead Connection Status: 753985
Implantable Lead Connection Status: 753985
Implantable Lead Implant Date: 20100527
Implantable Lead Implant Date: 20100527
Implantable Lead Location: 753859
Implantable Lead Location: 753860
Implantable Pulse Generator Implant Date: 20180202
Lead Channel Impedance Value: 340 Ohm
Lead Channel Pacing Threshold Amplitude: 0.75 V
Lead Channel Pacing Threshold Pulse Width: 0.6 ms
Lead Channel Sensing Intrinsic Amplitude: 11.7 mV
Lead Channel Setting Pacing Amplitude: 2.5 V
Lead Channel Setting Pacing Pulse Width: 0.6 ms
Lead Channel Setting Sensing Sensitivity: 0.5 mV
Pulse Gen Serial Number: 7402114

## 2022-12-28 NOTE — Progress Notes (Signed)
Cardiology Office Note:    Date:  01/03/2023   ID:  Mike Cole, DOB [birth year], MRN [id number]  PCP:  Mike Mares, PA   [facility] Providers Cardiologist:  Mike Muss, MD Cardiology APP:  Mike Hoard [name], NP  Electrophysiologist:  Mike Manges, MD     Referring MD: Mike Cole, [place], [place]   Chief Complaint: follow-up atrial fib  History of Present Illness:    Mike Cole is a very pleasant 72 y.o. male with a hx of CAD s/p anterior MI with BMS to LAD 2010 complicated by cardiac arrest and subsequent ischemic cardiomyopathy s/p AICD implant. Additional hx includes HLD, TIA,and HTN.   Generator change out February 2018 and he felt poorly after this.  It was difficult to walk.  He had his medications adjusted and he felt better. Per his report, one of his ICD leads is not attached.   He received a list of primary care providers in the area upon request in 2018.  He joined a gym and worked out on the treadmill and the elliptical 5 days a week as well as lifting weights and use of weight machines.  Exercise decreased during COVID.   Echocardiogram 2019 moderately dilated LV with EF moderately reduced 35 to 40%, akinesis of the mid apicalanteroseptal and apical myocardium.  Features are consistent with a pseudo normal left ventricular filling pattern with concomitant abnormal relaxation and increased filling pressure (G2 DD), mild MR, mild to moderate TR.  He was switched to [facility] in 2023, however EF did not improve.  Cardiology clinic visit was 06/26/2022 with Dr. Eldridge Cole at which time he had no significant cardiac symptoms and was advised to return in 1 year for follow-up.  ED admission 11/01/2022 - sudden rapid HR, palpitations associated with lightheadedness/weakness, and dyspnea. He reported sensation "like my heart was wobbling."  He denied chest pain.  Had recent heavy exertion a few days prior while working on his roof, prompted more feelings of  fatigue. Admitted feeling worn down over the previous week with afternoon headaches on a couple of occasions.  Was found to be in A-fib with RVR.  His ICD interrogation revealed rapid ventricular rate that developed that morning around 6:30 AM.  This correlates with onset of symptoms. He received amiodarone and subsequently had return of NSR around 9:50 that morning.  Plan discussed with cardiology to stop amiodarone and reduce Entresto dosage in order to uptitrate carvedilol. CHA2DS2-VASc score of 6.  Patient was started on Eliquis 5 mg twice daily.  Underwent outpatient echo on 11/03/2022 which revealed LVEF 25 to 30%, no evidence of LV thrombus, basal to mid inferior septal and anterior septal akinesis, apical anterior/inferior/lateral akinesis, and akinesis of the true apex, G1DD, RVSP 30.9 mmHg, mild biatrial enlargement, mild MR.  Seen by me on 11/07/22 for follow-up of new onset atrial fibrillation. Reported feeling poorly within a short time after taking carvedilol and Entresto, very fatigued. Has taken both medications for years, thinks it may be the increased dose of carvedilol. Admits he was feeling very poorly prior to hospital admission. Worked on his roof, dug a ditch, worked in the hot weather for hours without good hydration for 2 days in a row. Also got GI illness from eating at [place] at the same time. No previous hx of a fib. Resting HR consistently 58 bpm for 14 years. He converted to NSR prior to leaving the hospital and has had no further episodes of A-fib to his  awareness. Home BP and HR have been stable, no tachycardia. He denied chest pain, shortness of breath, lower extremity edema, palpitations, melena, hematuria, hemoptysis, diaphoresis, weakness, presyncope, syncope, orthopnea, and PND. Has a FitBit but does not know how to review HR readings. Daughter is a cardiac nurse. We reduced his carvedilol to 3.125 mg twice daily and changed Entresto to 97-103 mg twice daily. BMP  checked 2 weeks later showed stable renal function and electrolytes.  Today, he is here for 2 month follow-up. Reports he is feeling better than at last office visit. Feels that medication doses are good. Has occasional lightheadedness when bending over and when looking up for extended period. No presyncope or syncope. He feels like this started after starting Eliquis.  Advised that there are alternative OAC's that he can try.  Recent tooth extraction x 2, he bled for 8 hours afterwards. Would like to hold Eliquis for future dental work. Feels fatigued at times, but overall has resumed activities and is working out without significant fatigue. "Feel like I'm just getting back to normal." He denies chest pain, shortness of breath, lower extremity edema, palpitations, melena, orthopnea, and PND. Had significant ringing in his ears a few months ago which has improved. We discussed follow-up with PCP.     Past Medical History:  Diagnosis Date   Acute anterior myocardial infarction (HCC)    BMS to the LAD   Cardiogenic shock (HCC)    resolved   Coronary artery disease    Dyslipidemia    History of transient ischemic attack    Ischemic cardiomyopathy    EF of 20-25%   Ventricular tachycardia (HCC)    arrest, resuscitated    Past Surgical History:  Procedure Laterality Date   EP IMPLANTABLE DEVICE N/A 07/07/2016   Procedure: ICD Generator Changeout;  Surgeon: Duke Salvia, MD;  Location: [facility] INVASIVE CV LAB;  Service: Cardiovascular;  Laterality: N/A;   St Jude CURRENT + DR  10/30/2008    Current Medications: Current Meds  Medication Sig   apixaban (ELIQUIS) 5 MG TABS tablet Take 1 tablet (5 mg total) by mouth 2 (two) times daily.   atorvastatin (LIPITOR) 40 MG tablet TAKE 1 TABLET BY MOUTH EVERY DAY AT 6PM   carvedilol (COREG) 3.125 MG tablet Take 1 tablet (3.125 mg total) by mouth 2 (two) times daily with a meal.   Lecith-Inosi-Chol-B12-Liver (LIVERITE) 1.125 MCG TABS Take 1.125 mcg by  mouth 2 (two) times daily.   NON FORMULARY Relief Factor take 4 capsules bid   sacubitril-valsartan (ENTRESTO) 97-103 MG Take 1 tablet by mouth 2 (two) times daily.   spironolactone (ALDACTONE) 25 MG tablet TAKE 1 TABLET (25 MG TOTAL) BY MOUTH DAILY.     Allergies:   Patient has no known allergies.   Social History   Socioeconomic History   Marital status: Married    Spouse name: Not on file   Number of children: Not on file   Years of education: Not on file   Highest education level: Not on file  Occupational History   Not on file  Tobacco Use   Smoking status: Former    Current packs/day: 0.00    Types: Cigarettes    Quit date: 06/05/2008    Years since quitting: 14.5   Smokeless tobacco: Never  Vaping Use   Vaping status: Never Used  Substance and Sexual Activity   Alcohol use: Yes    Comment: occ   Drug use: No   Sexual activity: Not on  file  Other Topics Concern   Not on file  Social History Narrative   Not on file   Social Determinants of Health   Financial Resource Strain: Not on file  Food Insecurity: Not on file  Transportation Needs: Not on file  Physical Activity: Not on file  Stress: Not on file  Social Connections: Not on file     Family History: The patient's family history includes CAD in his brother, brother, father, mother, and sister; Diabetes in his father; Heart disease in his brother.  ROS:   Please see the history of present illness. + occasional lightheadedness All other systems reviewed and are negative.   Labs/Other Studies Reviewed:    The following studies were reviewed today:  Echo 11/03/22 1. Left ventricular ejection fraction, by estimation, is 25 to 30%. The  left ventricle has severely decreased function. The left ventricle  demonstrates regional wall motion abnormalities with basal to mid  inferoseptal and anteroseptal akinesis, apical  anterior/inferior/lateral akinesis, and akinesis of the true apex. No LV  thrombu  noted. The left ventricular internal cavity size was mildly  dilated. Left ventricular diastolic parameters are consistent with Grade I  diastolic dysfunction (impaired  relaxation).   2. Right ventricular systolic function is normal. The right ventricular  size is normal. There is normal pulmonary artery systolic pressure. The  estimated right ventricular systolic pressure is 30.9 mmHg.   3. Left atrial size was mildly dilated.   4. Right atrial size was mildly dilated.   5. The mitral valve is normal in structure. Mild mitral valve  regurgitation. No evidence of mitral stenosis.   6. The aortic valve is tricuspid. Aortic valve regurgitation is trivial.  No aortic stenosis is present.   7. Aortic dilatation noted. There is mild dilatation of the ascending  aorta, measuring 38 mm.   8. The inferior vena cava is normal in size with greater than 50%  respiratory variability, suggesting right atrial pressure of 3 mmHg.  Carotid Duplex 05/25/20 Summary:  Right Carotid: Velocities in the right ICA are consistent with a 1-39%  stenosis.                Non-hemodynamically significant plaque <50% noted in the  CCA.   Left Carotid: Velocities in the left ICA are consistent with a 1-39%  stenosis.               Non-hemodynamically significant plaque <50% noted in the  CCA.   Vertebrals: Bilateral vertebral arteries demonstrate antegrade flow.  Subclavians: Normal flow hemodynamics were seen in bilateral subclavian               arteries.   *See table(s) above for measurements and observations.     Recent Labs: 11/01/2022: ALT 16; B Natriuretic Peptide 125.8; Hemoglobin 14.8; Magnesium 1.8; Platelets 158 11/21/2022: BUN 12; Creatinine, Ser 1.17; Potassium 4.4; Sodium 139  Recent Lipid Panel    Component Value Date/Time   CHOL 103 05/10/2020 0923   TRIG 54 05/10/2020 0923   HDL 47 05/10/2020 0923   CHOLHDL 2.2 05/10/2020 0923   CHOLHDL 2.4 02/14/2016 0922   VLDL 13 02/14/2016 0922    LDLCALC 43 05/10/2020 0923     Risk Assessment/Calculations:    CHA2DS2-VASc Score = 6   This indicates a 9.7% annual risk of stroke. The patient's score is based upon: CHF History: 1 HTN History: 1 Diabetes History: 0 Stroke History: 2 Vascular Disease History: 1 Age Score: 1 Gender Score: 0  Physical Exam:    VS:  BP 124/70   Pulse (!) 58   Ht 6\' 2"  (1.88 m)   Wt 217 lb (98.4 kg)   SpO2 98%   BMI 27.86 kg/m     Wt Readings from Last 3 Encounters:  01/03/23 217 lb (98.4 kg)  11/07/22 212 lb (96.2 kg)  11/01/22 210 lb (95.3 kg)     GEN:  Well nourished, well developed in no acute distress HEENT: Normal NECK: No JVD; No carotid bruits CARDIAC: RRR, no murmurs, rubs, gallops RESPIRATORY:  Clear to auscultation without rales, wheezing or rhonchi  ABDOMEN: Soft, non-tender, non-distended MUSCULOSKELETAL:  No edema; No deformity. 2+ pedal pulses, equal bilaterally SKIN: Warm and dry NEUROLOGIC:  Alert and oriented x 3 PSYCHIATRIC:  Normal affect   EKG:  EKG is not ordered today      Diagnoses:    1. Ischemic cardiomyopathy   2. ICD (implantable cardioverter-defibrillator) in place   3. Chronic combined systolic and diastolic heart failure (HCC)   4. Chronic anticoagulation   5. PAF (paroxysmal atrial fibrillation) (HCC)   6. Essential hypertension   7. Dyslipidemia   8. Bilateral carotid artery stenosis   9. Coronary artery disease involving native coronary artery of native heart without angina pectoris     Assessment and Plan:     PAF on chronic anticoagulation: Clinically appears to be maintaining sinus rhythm today. No return of AF to his awareness. HR is well controlled. Resting HR in 50s for several years. Feels good on current dose of carvedilol. No tachypalpitations. Bled a lot during tooth extraction. Would like to contact us for clearance to hold Eliquis for future dental work. Continue Eliquis 5 mg BID which is appropriate dose for  stroke prevention for CHA2DS2-VASc score of 6.  Chronic combined CHF/ICM s/p AICD implant: Echo 11/03/22 revealed LVEF 25 to 30%, wall motion abnormalities with basal to mid inferior septal and anteroseptal akinesis, apical anterior/inferior/lateral akinesis and akinesis of the true apex, no LV thrombus, G1DD, RVSP 30.9 mmHg, mild biatrial enlargement, mild MR. Normal right atrial pressure. No evidence of volume overload on exam. He denies dyspnea, orthopnea, PND, edema. Weight is stable. Renal function has been stable on current GDMT.  Continue low sodium diet and regular exercise.  Continue spironolactone, Entresto, carvedilol.  CAD without angina: S/p anterior MI with BMS to LAD 2010 complicated by cardiac arrest and subsequent ischemic cardiomyopathy s/p AICD implant. He denies chest pain, dyspnea, or other symptoms concerning for angina. No indication for further ischemic evaluation at this time. Continue carvedilol, atorvastatin, Entresto, and spironolactone. He is not on asa due to need for [facility].   Carotid artery disease: Mild bilateral stenosis 1-39% on carotid duplex 05/2020.  He has occasional lightheadedness when looking up or bending over. We discussed repeating carotid duplex to evaluate for worsening stenosis.  He would like to continue to monitor symptoms for a while and will notify us if symptoms worsen.   Hyperlipidemia: Goal LDL < 55. LDL 52 on 03/28/2022.  Continue atorvastatin.  Hypertension: BP is well controlled. He feels well on current regimen. No medication changes today.    AICD Implant: Stable device function on remote device check 12/15/2022. No acute concerns today. Management per EP, he has appointment with Dr. Graciela Husbands in September.      Disposition: Keep your September appointment with Dr. Limmie Patricia months with me  Medication Adjustments/Labs and Tests Ordered: Current medicines are reviewed at length with the patient today.  Concerns regarding  medicines are outlined above.   No orders of the defined types were placed in this encounter.  No orders of the defined types were placed in this encounter.   Patient Instructions  Medication Instructions:   Your physician recommends that you continue on your current medications as directed. Please refer to the Current Medication list given to you today.   *If you need a refill on your cardiac medications before your next appointment, please call your pharmacy*   Lab Work:  None ordered.  If you have labs (blood work) drawn today and your tests are completely normal, you will receive your results only by: MyChart Message (if you have MyChart) OR A paper copy in the mail If you have any lab test that is abnormal or we need to change your treatment, we will call you to review the results.   Testing/Procedures:  None ordered.   Follow-Up: At [facility], you and your health needs are our priority.  As part of our continuing mission to provide you with exceptional heart care, we have created designated Provider Care Teams.  These Care Teams include your primary Cardiologist (physician) and Advanced Practice Providers (APPs -  Physician Assistants and Nurse Practitioners) who all work together to provide you with the care you need, when you need it.  We recommend signing up for the patient portal called "MyChart".  Sign up information is provided on this After Visit Summary.  MyChart is used to connect with patients for Virtual Visits (Telemedicine).  Patients are able to view lab/test results, encounter notes, upcoming appointments, etc.  Non-urgent messages can be sent to your provider as well.   To learn more about what you can do with MyChart, go to [email or web address].    Your next appointment:   6 month(s)  Provider:   Eligha Bridegroom, NP         Other Instructions  I WILL CALL YOU OR SEND BY CHART MESSAGE WITH APPOINTMENT WITH Lakedra Washington,NP FOR 8:40 OR LATER APPT IN SEPTEMBER WHEN  HER SCHEDULE IS OPENED.     Signed, Levi Aland, NP  01/03/2023 3:02 PM    [facility]

## 2023-01-01 NOTE — Progress Notes (Signed)
Remote ICD transmission.   

## 2023-01-03 ENCOUNTER — Encounter: Payer: Self-pay | Admitting: Nurse Practitioner

## 2023-01-03 ENCOUNTER — Ambulatory Visit: Payer: PPO | Attending: Nurse Practitioner | Admitting: Nurse Practitioner

## 2023-01-03 VITALS — BP 124/70 | HR 58 | Ht 74.0 in | Wt 217.0 lb

## 2023-01-03 DIAGNOSIS — Z7901 Long term (current) use of anticoagulants: Secondary | ICD-10-CM | POA: Diagnosis not present

## 2023-01-03 DIAGNOSIS — E785 Hyperlipidemia, unspecified: Secondary | ICD-10-CM

## 2023-01-03 DIAGNOSIS — I255 Ischemic cardiomyopathy: Secondary | ICD-10-CM

## 2023-01-03 DIAGNOSIS — I2510 Atherosclerotic heart disease of native coronary artery without angina pectoris: Secondary | ICD-10-CM

## 2023-01-03 DIAGNOSIS — Z95810 Presence of automatic (implantable) cardiac defibrillator: Secondary | ICD-10-CM

## 2023-01-03 DIAGNOSIS — I5042 Chronic combined systolic (congestive) and diastolic (congestive) heart failure: Secondary | ICD-10-CM | POA: Diagnosis not present

## 2023-01-03 DIAGNOSIS — I10 Essential (primary) hypertension: Secondary | ICD-10-CM

## 2023-01-03 DIAGNOSIS — I6523 Occlusion and stenosis of bilateral carotid arteries: Secondary | ICD-10-CM

## 2023-01-03 DIAGNOSIS — I480 Paroxysmal atrial fibrillation: Secondary | ICD-10-CM

## 2023-01-03 NOTE — Patient Instructions (Signed)
Medication Instructions:   Your physician recommends that you continue on your current medications as directed. Please refer to the Current Medication list given to you today.   *If you need a refill on your cardiac medications before your next appointment, please call your pharmacy*   Lab Work:  None ordered.  If you have labs (blood work) drawn today and your tests are completely normal, you will receive your results only by: MyChart Message (if you have MyChart) OR A paper copy in the mail If you have any lab test that is abnormal or we need to change your treatment, we will call you to review the results.   Testing/Procedures:  None ordered.   Follow-Up: At [facility], you and your health needs are our priority.  As part of our continuing mission to provide you with exceptional heart care, we have created designated Provider Care Teams.  These Care Teams include your primary Cardiologist (physician) and Advanced Practice Providers (APPs -  Physician Assistants and Nurse Practitioners) who all work together to provide you with the care you need, when you need it.  We recommend signing up for the patient portal called "MyChart".  Sign up information is provided on this After Visit Summary.  MyChart is used to connect with patients for Virtual Visits (Telemedicine).  Patients are able to view lab/test results, encounter notes, upcoming appointments, etc.  Non-urgent messages can be sent to your provider as well.   To learn more about what you can do with MyChart, go to [email or web address].    Your next appointment:   6 month(s)  Provider:   Eligha Bridegroom, NP         Other Instructions  I WILL CALL YOU OR SEND BY CHART MESSAGE WITH APPOINTMENT WITH MICHELLE SWINYER,NP FOR 8:40 OR LATER APPT IN SEPTEMBER WHEN HER SCHEDULE IS OPENED.

## 2023-02-14 ENCOUNTER — Ambulatory Visit: Payer: PPO | Attending: Internal Medicine | Admitting: Internal Medicine

## 2023-02-14 ENCOUNTER — Encounter: Payer: Self-pay | Admitting: Internal Medicine

## 2023-02-14 VITALS — BP 120/62 | HR 57 | Ht 74.0 in | Wt 211.0 lb

## 2023-02-14 DIAGNOSIS — I472 Ventricular tachycardia, unspecified: Secondary | ICD-10-CM

## 2023-02-14 DIAGNOSIS — I5022 Chronic systolic (congestive) heart failure: Secondary | ICD-10-CM

## 2023-02-14 DIAGNOSIS — I255 Ischemic cardiomyopathy: Secondary | ICD-10-CM

## 2023-02-14 DIAGNOSIS — I495 Sick sinus syndrome: Secondary | ICD-10-CM | POA: Diagnosis not present

## 2023-02-14 DIAGNOSIS — Z95810 Presence of automatic (implantable) cardiac defibrillator: Secondary | ICD-10-CM

## 2023-02-14 LAB — CUP PACEART INCLINIC DEVICE CHECK
Battery Remaining Longevity: 45 mo
Brady Statistic RA Percent Paced: 0 %
Brady Statistic RV Percent Paced: 0 %
Date Time Interrogation Session: 20240911154054
HighPow Impedance: 73.125
Implantable Lead Connection Status: 753985
Implantable Lead Connection Status: 753985
Implantable Lead Implant Date: 20100527
Implantable Lead Implant Date: 20100527
Implantable Lead Location: 753859
Implantable Lead Location: 753860
Implantable Pulse Generator Implant Date: 20180202
Lead Channel Impedance Value: 350 Ohm
Lead Channel Impedance Value: 425 Ohm
Lead Channel Pacing Threshold Amplitude: 1 V
Lead Channel Pacing Threshold Amplitude: 1.25 V
Lead Channel Pacing Threshold Amplitude: 1.25 V
Lead Channel Pacing Threshold Pulse Width: 0.6 ms
Lead Channel Pacing Threshold Pulse Width: 0.6 ms
Lead Channel Pacing Threshold Pulse Width: 0.6 ms
Lead Channel Sensing Intrinsic Amplitude: 0 mV
Lead Channel Sensing Intrinsic Amplitude: 0.2 mV
Lead Channel Sensing Intrinsic Amplitude: 12 mV
Lead Channel Setting Pacing Amplitude: 2.5 V
Lead Channel Setting Pacing Pulse Width: 0.6 ms
Lead Channel Setting Sensing Sensitivity: 0.5 mV
Pulse Gen Serial Number: 7402114

## 2023-02-14 NOTE — Progress Notes (Signed)
Patient ID: Mike Cole, male   DOB: 1950/06/09, 72 y.o.   MRN: [id number] Patient Care Team: Collene Mares, [place] as PCP - General (Internal Medicine) Corky Crafts, MD as PCP - Cardiology (Cardiology) Duke Salvia, MD as PCP - Electrophysiology (Cardiology) Swinyer, [name], NP as Nurse Practitioner (Cardiology)   HPI  Mike Cole is a 72 y.o. male seen in followup for ventricular tachycardia/fibrillation presented as an out of hospital cardiac arrest. He  underwent intervention of an occluded LAD but had persistent left ventricular dysfunction prompting dual chamber ICD implantation St Jude/  Generator replacement 2/18.  He has atrial lead failure (to capture) prompting reprogramming of the device DDD-VVI lower rate of 40, this to avoid ventricular pacing  Episode of atrial fibrillation prompted hospitalization 5/24.  Associated with palpitations and some shortness of breath and lightheadedness.  Not a familiar sensation.  Not recurring.  Atrial fibrillation terminated spontaneously.  Generally has some fatigue and mild shortness of breath but is able to walk a couple miles and do his daily chores and then some and then some more and then some more more   DATE TEST EF   5/10 Echo 30-35 %   11/17 Echo 35-40 %   6/19 Echo  35-40% Anterior AK   5/24 Echo   25-30%       Date Cr K Hgb LDL  1/18  1.18 4.6 13.2    6/18 1.23 4.4   53  11/19 1.19 4.8    10/20 1.08 4.8  (2/20) 47  12/21 1.07 4.8 13.5 43  4/23 1.05 4.9           Event recorder    Past Medical History:  Diagnosis Date   Acute anterior myocardial infarction (HCC)    BMS to the LAD   Cardiogenic shock (HCC)    resolved   Coronary artery disease    Dyslipidemia    History of transient ischemic attack    Ischemic cardiomyopathy    EF of 20-25%   Ventricular tachycardia (HCC)    arrest, resuscitated    Past Surgical History:  Procedure Laterality Date   EP IMPLANTABLE DEVICE N/A 07/07/2016    Procedure: ICD Generator Changeout;  Surgeon: Duke Salvia, MD;  Location: [facility] INVASIVE CV LAB;  Service: Cardiovascular;  Laterality: N/A;   St Jude CURRENT + DR  10/30/2008    Current Outpatient Medications  Medication Sig Dispense Refill   apixaban (ELIQUIS) 5 MG TABS tablet Take 1 tablet (5 mg total) by mouth 2 (two) times daily. 60 tablet 6   atorvastatin (LIPITOR) 40 MG tablet TAKE 1 TABLET BY MOUTH EVERY DAY AT 6PM 90 tablet 3   carvedilol (COREG) 3.125 MG tablet Take 1 tablet (3.125 mg total) by mouth 2 (two) times daily with a meal. 180 tablet 3   Lecith-Inosi-Chol-B12-Liver (LIVERITE) 1.125 MCG TABS Take 1.125 mcg by mouth 2 (two) times daily.     NON FORMULARY Relief Factor take 4 capsules bid     sacubitril-valsartan (ENTRESTO) 97-103 MG Take 1 tablet by mouth 2 (two) times daily. 60 tablet 11   spironolactone (ALDACTONE) 25 MG tablet TAKE 1 TABLET (25 MG TOTAL) BY MOUTH DAILY. 90 tablet 3   No current facility-administered medications for this visit.    No Known Allergies  Review of Systems negative except from HPI and PMH  Physical Exam: BP 120/62   Pulse (!) 57   Ht 6\' 2"  (1.88 m)   Wt  211 lb (95.7 kg)   SpO2 98%   BMI 27.09 kg/m  Well developed and well nourished in no acute distress HENT normal Neck supple with JVP-flat Clear Device pocket well healed; without hematoma or erythema.  There is no tethering  Regular rate and rhythm, no  gallop No  murmur Abd-soft with active BS No Clubbing cyanosis  edema Skin-warm and dry A & Oriented  Grossly normal sensory and motor function  ECG sinus at 57 Of 18/09/44 Anterior wall MI  Device function is normal. Programming changes none   See Paceart for details    Assessment and  Plan  Ischemic cardiomyopathy  Aborted Cardiac Arrest   Implantable defibrillator-St. Jude     Hyperlipidemia  Congestive heart failure-class II  Low voltage ECG   High Risk Medication Surveillance Aldactone  Atrial lead  failure  Sinus node dysfunction   No symptoms of ischemia continue Asa atorvastatin His cardiomyopathy will continue Entresto carvedilol and spironolactone, the carvedilol 3.125 because of efforts to uptitrate were associated with symptomatic bradycardia.  He is open to Bernie and we will have him go back to see Megan Supple  Euvolemic.  Continue his Aldactone

## 2023-02-14 NOTE — Patient Instructions (Signed)
Medication Instructions:  Your physician recommends that you continue on your current medications as directed. Please refer to the Current Medication list given to you today.  *If you need a refill on your cardiac medications before your next appointment, please call your pharmacy*   Lab Work: None ordered.  If you have labs (blood work) drawn today and your tests are completely normal, you will receive your results only by: MyChart Message (if you have MyChart) OR A paper copy in the mail If you have any lab test that is abnormal or we need to change your treatment, we will call you to review the results.   Testing/Procedures: None ordered.    Follow-Up: At [facility], you and your health needs are our priority.  As part of our continuing mission to provide you with exceptional heart care, we have created designated Provider Care Teams.  These Care Teams include your primary Cardiologist (physician) and Advanced Practice Providers (APPs -  Physician Assistants and Nurse Practitioners) who all work together to provide you with the care you need, when you need it.  We recommend signing up for the patient portal called "MyChart".  Sign up information is provided on this After Visit Summary.  MyChart is used to connect with patients for Virtual Visits (Telemedicine).  Patients are able to view lab/test results, encounter notes, upcoming appointments, etc.  Non-urgent messages can be sent to your provider as well.   To learn more about what you can do with MyChart, go to [email or web address].    Your next appointment:   12 months with Dr Graciela Husbands  Appointment needed with PharmD to discuss [facility]

## 2023-02-23 ENCOUNTER — Ambulatory Visit: Payer: PPO | Attending: Internal Medicine | Admitting: Pharmacist

## 2023-02-23 ENCOUNTER — Encounter: Payer: Self-pay | Admitting: Pharmacist

## 2023-02-23 VITALS — BP 120/64 | HR 63 | Wt 207.0 lb

## 2023-02-23 DIAGNOSIS — I5022 Chronic systolic (congestive) heart failure: Secondary | ICD-10-CM | POA: Diagnosis not present

## 2023-02-23 MED ORDER — EMPAGLIFLOZIN 10 MG PO TABS: 10.0000 mg | ORAL_TABLET | Freq: Every day | ORAL | 3 refills | Status: DC

## 2023-02-23 NOTE — Progress Notes (Signed)
Patient ID: Mike Cole                 DOB: 1950-08-26                      MRN: [id number]     HPI: Mike Cole is a 72 y.o. male referred by Dr. Graciela Husbands to pharmacy clinic for HF medication management. PMH is significant for ischemic cardiomyopathy, CAD s/p MI in 2010, TIA, dyslipidemia, ventricular tachycardia, implantable defibrillator ([employer] Jude), and ICD in place . Most recent LVEF 25-30% on 11/03/22. I followed pt for his HF in 2023, titrated his CHF meds however wanted to hold off on SGLT2i due to pill burden. Was receiving Entresto from patient assistance. Home BP cuff was measuring accurately.   Pt presents today for follow up. Reports tolerating medications well. No LE edema or SOB. Walks a few miles at the gym, not as regularly now as in the past. Eliquis started in May, not in the donut hole yet. Still getting Entresto from [employer] pt assistance. Open to starting Jardiance. He spoke with his nephew who started [place] and felt much better on the med - improvement in energy and was able to start playing golf again. Notices low HR a few times a month down to the 40s, feels a bit more fatigued when this happens. Carvedilol dose was decreased to 3.125mg  BID at June 2024 appt.  Current CHF meds: Entresto 97-103mg  BID, spironolactone 25mg  daily, carvedilol 3.125mg  BID  BP goal: <130/52mmHg  Family History: The patient's family history includes CAD in his brother, brother, father, mother, and sister; Diabetes in his father; Heart disease in his brother.    Social History: The patient  reports that he quit smoking about 14 years ago. His smoking use included cigarettes. He has never used smokeless tobacco. He reports current alcohol use. He reports that he does not use drugs.    Diet: Trying to eat more vegetables. Lots of sandwiches and frozen meals, canned soup; drinks ~1 tea/day. Doesn't add salt to his food.   Exercise: Exercise decreased during COVID. He has been doing things  around the house. Went back to gym for first time in a month, aiming to go every day, use different machines, and walk at least a mile every day  Wt Readings from Last 3 Encounters:  02/14/23 211 lb (95.7 kg)  01/03/23 217 lb (98.4 kg)  11/07/22 212 lb (96.2 kg)   BP Readings from Last 3 Encounters:  02/14/23 120/62  01/03/23 124/70  11/07/22 122/66   Pulse Readings from Last 3 Encounters:  02/14/23 (!) 57  01/03/23 (!) 58  11/07/22 (!) 55    Renal function: CrCl cannot be calculated (Patient's most recent lab result is older than the maximum 21 days allowed.).  Past Medical History:  Diagnosis Date   Acute anterior myocardial infarction (HCC)    BMS to the LAD   Cardiogenic shock (HCC)    resolved   Coronary artery disease    Dyslipidemia    History of transient ischemic attack    Ischemic cardiomyopathy    EF of 20-25%   Ventricular tachycardia (HCC)    arrest, resuscitated    Current Outpatient Medications on File Prior to Visit  Medication Sig Dispense Refill   apixaban (ELIQUIS) 5 MG TABS tablet Take 1 tablet (5 mg total) by mouth 2 (two) times daily. 60 tablet 6   atorvastatin (LIPITOR) 40 MG tablet TAKE 1 TABLET BY  MOUTH EVERY DAY AT 6PM 90 tablet 3   carvedilol (COREG) 3.125 MG tablet Take 1 tablet (3.125 mg total) by mouth 2 (two) times daily with a meal. 180 tablet 3   Lecith-Inosi-Chol-B12-Liver (LIVERITE) 1.125 MCG TABS Take 1.125 mcg by mouth 2 (two) times daily.     NON FORMULARY Relief Factor take 4 capsules bid     sacubitril-valsartan (ENTRESTO) 97-103 MG Take 1 tablet by mouth 2 (two) times daily. 60 tablet 11   spironolactone (ALDACTONE) 25 MG tablet TAKE 1 TABLET (25 MG TOTAL) BY MOUTH DAILY. 90 tablet 3   No current facility-administered medications on file prior to visit.    No Known Allergies   Assessment/Plan:  1. CHF with EF 25-30% - Will start Jardiance 10mg  daily and continue Entresto 97-103mg  BID, carvedilol 3.125mg  BID (HR in 50s),  and spironolactone 25mg  daily. BP Mike and at goal. I enrolled pt in [employer] so that his [place] will be free. He is currently getting his Sherryll Burger from [employer] pt assistance. This will expire in December at the end of the year, discussed that [facility] grant will also work for [employer] and will plan to coordinate this in January when he needs his next fill. GDMT for HF is now optimized, would benefit from rechecking echo in another 3 months.  Mike Cole E. Mike Cole, PharmD, BCACP, CPP Mike Cole [address] [place], [place] [address] Phone: [phone]; Fax: [phone] 02/23/2023 8:55 AM

## 2023-02-23 NOTE — Patient Instructions (Signed)
Start taking Jardiance 10mg  - 1 tablet once daily in the morning with your other medications  Continue taking your other medications  I have enrolled you in a grant through the [employer]. Please show the below grant information to your pharmacy. They will need to process it like a secondary insurance after your [employer] Advantage Part D. It will cover whatever remaining copay you have and will make the Jardiance free.  Card # [id number]   BIN [id number]   PCN [id number]   Group [id number]  This grant will also work for your Sherryll Burger in January once your other patient assistance runs out

## 2023-03-14 LAB — CUP PACEART REMOTE DEVICE CHECK
Battery Remaining Longevity: 43 mo
Battery Remaining Percentage: 42 %
Battery Voltage: 2.92 V
Brady Statistic RV Percent Paced: 0 %
Date Time Interrogation Session: 20241009100455
HighPow Impedance: 75 Ohm
HighPow Impedance: 75 Ohm
Implantable Lead Connection Status: 753985
Implantable Lead Connection Status: 753985
Implantable Lead Implant Date: 20100527
Implantable Lead Implant Date: 20100527
Implantable Lead Location: 753859
Implantable Lead Location: 753860
Implantable Pulse Generator Implant Date: 20180202
Lead Channel Impedance Value: 330 Ohm
Lead Channel Pacing Threshold Amplitude: 1.25 V
Lead Channel Pacing Threshold Pulse Width: 0.6 ms
Lead Channel Sensing Intrinsic Amplitude: 11.7 mV
Lead Channel Setting Pacing Amplitude: 2.5 V
Lead Channel Setting Pacing Pulse Width: 0.6 ms
Lead Channel Setting Sensing Sensitivity: 0.5 mV
Pulse Gen Serial Number: 7402114

## 2023-03-16 ENCOUNTER — Ambulatory Visit (INDEPENDENT_AMBULATORY_CARE_PROVIDER_SITE_OTHER): Payer: PPO

## 2023-03-16 DIAGNOSIS — I495 Sick sinus syndrome: Secondary | ICD-10-CM

## 2023-03-26 NOTE — Progress Notes (Signed)
Remote ICD transmission.   

## 2023-04-11 ENCOUNTER — Telehealth: Payer: Self-pay | Admitting: Nurse Practitioner

## 2023-04-11 NOTE — Telephone Encounter (Signed)
Pt dropped of medication assistance forms to be completed, he stated he already sent off his portion of the packet. Its in your pink folder.

## 2023-04-12 ENCOUNTER — Telehealth: Payer: Self-pay

## 2023-04-12 NOTE — Telephone Encounter (Signed)
Pt's [employer] patient assistance prescriber form (1 page) was scanned to [employer], [facility] email. FYI

## 2023-04-12 NOTE — Telephone Encounter (Addendum)
Patient Advocate Encounter *Patient already has a healthwell grant approval on file that will cover Entresto copay.   The patient was approved for a [employer] grant that will help cover the cost of ENTRESTO AND JARDIANCE Total amount awarded, $10,000.  Effective: 01/24/23 - 01/23/24   [id number] [id number] [id number]  Haze Rushing, CPhT  Pharmacy Patient Advocate Specialist  Direct Number: [phone] Fax: [phone]

## 2023-04-13 ENCOUNTER — Other Ambulatory Visit (HOSPITAL_COMMUNITY): Payer: Self-pay

## 2023-04-13 MED ORDER — ENTRESTO 97-103 MG PO TABS: 1.0000 | ORAL_TABLET | Freq: Two times a day (BID) | ORAL | 3 refills | Status: DC

## 2023-04-13 NOTE — Telephone Encounter (Signed)
Please send in prescription for E[facility]O to [id number] [place], [place] - [address]  [address] [place], [place] [address]  including grant billing information in RX comment [id number], QIO:[id number], Group: [id number], UU:[id number])

## 2023-04-13 NOTE — Telephone Encounter (Signed)
[facility] with [facility] billing information in pharmacy note.

## 2023-04-26 ENCOUNTER — Other Ambulatory Visit: Payer: Self-pay | Admitting: Interventional Cardiology

## 2023-05-08 NOTE — Telephone Encounter (Signed)
S/w pt stated entresto was approved for a healthwell grant and was sent to requested pharmacy for 1 year supply. Pt stated appreciation and stated not going to worry about it.

## 2023-05-16 ENCOUNTER — Encounter: Payer: Self-pay | Admitting: Pediatrics

## 2023-05-30 ENCOUNTER — Other Ambulatory Visit: Payer: Self-pay | Admitting: Interventional Cardiology

## 2023-05-31 ENCOUNTER — Other Ambulatory Visit: Payer: Self-pay

## 2023-05-31 DIAGNOSIS — I480 Paroxysmal atrial fibrillation: Secondary | ICD-10-CM

## 2023-05-31 MED ORDER — APIXABAN 5 MG PO TABS
5.0000 mg | ORAL_TABLET | Freq: Two times a day (BID) | ORAL | 5 refills | Status: DC
Start: 2023-05-31 — End: 2023-11-28

## 2023-05-31 NOTE — Telephone Encounter (Signed)
Prescription refill request for Eliquis received. Indication: Afib  Last office visit: 02/14/23 Mike Cole)  Scr: 1.17 (11/21/22)  Age: 72 Weight: 93.9kg  Appropriate dose. Refill sent.

## 2023-06-07 NOTE — Progress Notes (Signed)
 Cardiology Office Note:  .   Date:  06/18/2023  ID:  Mike Cole, DOB [birth year], MRN [id number] PCP: [place], [place]  E, PA  [facility] Providers Cardiologist:  Mike Reek, MD Cardiology APP:  Mike Rosaline [name], NP  Electrophysiologist:  Mike Sage, MD    Patient Profile: .      PMH CAD S/p anterior MI with BMS to LAD 2010 C/b cardiac arrest and subsequent ischemia cardiomyopathy s/p AICD implant Hypertension Hyperlipidemia TIA Atrial fibrillation on chronic anticoagulation Former tobacco abuse Obesity  Generator change out February 2018 and he felt poorly after this.  It was difficult to walk.  He had his medications adjusted and he felt better.  Per his report, one of his ICD leads is not attached.  He started working out in 2018 on the treadmill and elliptical 5 days a week as well as weightlifting.  Exercise decreased during COVID pandemic.  Echocardiogram 2019 revealed moderately dilated LV with EF moderately reduced 35 to 40%, akinesis of the midapical anteroseptal and apical myocardium.  Features consistent with pseudo normal left ventricular filling pattern with concomitant abnormal relaxation and increased filling pressure (G2 DD), mild MR, mild to moderate TR.  He was switched to Entresto  in 2023, however EF did not improve.  He was feeling well at clinic visit with Dr. Reek 06/26/2022 and 1 year follow-up was recommended.  ED admission 11/01/2022 with sudden rapid HR, palpitations associated with lightheadedness/weakness and dyspnea.  He reported sensation like my heart was wobbling.[name]  He denied chest pain.  Recent heavy exertion a few days prior working on a roof, prompted more feelings of fatigue.  He was found to be in A-fib RVR.  ICD interrogation revealed rapid ventricular rate that developed around 6:30 AM day of admission.  He received amiodarone  and subsequently had return of NSR around 950 that morning.  Plan discussed with cardiology to stop  amiodarone  and reduce Entresto  dosage in order to uptitrate carvedilol .  He was started on Eliquis  5 mg twice daily for stroke prevention for CHA2DS2-VASc score of 6.  He underwent echocardiogram 11/03/2022 which revealed LVEF 25 to 30%, no evidence of LV thrombus, basal to mid inferior septal and anterior septal akinesis, apical anterior/inferior/lateral akinesis and akinesis of the true apex, G1 DD, RVSP 30.9 mmHg, mild biatrial enlargement, and mild MR.  Seen in clinic by me on 11/07/2022.  Reported feeling poorly within a short time of taking carvedilol  and Entresto , very fatigued.  Felt that atrial fibrillation was aggravated by working outside without good hydration for 2 days in a row and GI illness from a [employer] restaurant at the same time.  Prior to development of A-fib, resting HR was consistently 58 bpm for about 14 years.  He reported no further episodes of A-fib to his awareness.  HR and BP had been stable at home.  Has a Fitbit but not aware of how to review HR readings.  His daughter is a engineer, civil (consulting) on a cardiac unit.  We reduced his carvedilol  to 3.125 mg twice daily and increased Entresto  to 97-103 mg twice daily.  BMP checked 2 weeks later showed stable renal function and electrolytes.  Seen again in clinic by me on 01/03/2023 at which time he reported feeling better with the changes in medications.  Continued to have occasional lightheadedness when bending over or looking up for extended periods but no presyncope or syncope.  He felt like this started after starting Eliquis .  During recent extraction of 2 teeth.  He bled for 8 hours afterwards.  He did not have any evidence of volume overload on exam.  BP was well-controlled and no medication changes were made.  Seen by Dr. Fernande 02/14/2023 at which time he had normal device function.  He was referred to [facility]D. for initiation of SGLT 2 inhibitor.  He was started on Jardiance  02/23/2023 by Mike Cole, RPH       History of Present Illness: .    Mike Cole is a very pleasant 73 y.o. male who is here today for 6 month follow-up. presents with concerns about the cost of Eliquis . He reports that the cost of his last two prescriptions significantly increased, from $40 to over a hundred dollars. He has contacted his the [employer] company, who assured him that the issue should be resolved this month. He reports he is feeling well. He denies palpitations. Attributes his previous episode of atrial fibrillation to overexertion. He expresses a desire to reduce his medication load, particularly Eliquis . He also reports occasional dizziness with bending over and coming up too fast. History of poor sleep, waking up multiple times during the night which he attributes to his previous work schedule which kept him up for days at the time. He acknowledges that he does not eat as well in the winter with a lack of vegetables. Has started a supplement called Immuno 150 to compensate for his dietary deficiencies. He denies chest pain, shortness of breath, lower extremity edema, fatigue, palpitations, melena, presyncope, syncope, orthopnea, and PND.  Discussed the use of AI scribe software for clinical note transcription with the patient, who gave verbal consent to proceed.   ROS: See HPI       Studies Reviewed: .        Risk Assessment/Calculations:    CHA2DS2-VASc Score = 6   This indicates a 9.7% annual risk of stroke. The patient's score is based upon: CHF History: 1 HTN History: 1 Diabetes History: 0 Stroke History: 2 Vascular Disease History: 1 Age Score: 1 Gender Score: 0            Physical Exam:   VS:  BP 118/62   Pulse 60   Ht 6' 2 (1.88 m)   Wt 207 lb 12.8 oz (94.3 kg)   SpO2 98%   BMI 26.68 kg/m    Wt Readings from Last 3 Encounters:  06/18/23 207 lb 12.8 oz (94.3 kg)  02/23/23 207 lb (93.9 kg)  02/14/23 211 lb (95.7 kg)    GEN: Well nourished, well developed in no acute distress NECK: No JVD; + right carotid bruit CARDIAC:  RRR, no murmurs, rubs, gallops RESPIRATORY:  Clear to auscultation without rales, wheezing or rhonchi  ABDOMEN: Soft, non-tender, non-distended EXTREMITIES:  No edema; No deformity     ASSESSMENT AND PLAN: .    Chronic combined CHF/ICM s/p AICD implant: Decreased LVEF 35-40% following anterior MI with subsequent cardiac arrest. Reduced LVEF 25-30% on echo 12/2021. He reports occasional shortness of breath but overall feels well and remains very active.  He appears euvolemic on exam.  Admits that cold weather limits his activity somewhat. He likes to be outside.  He denies dyspnea, orthopnea, PND, edema. No problems with medications for GDMT for CHF including carvedilol , Jardiance , Entresto , and spironolactone . We will check renal function today.  We will get repeat echocardiogram to evaluate heart function since he has been on goal directed med therapy for > 3 months.   CAD without angina: History of anterior MI with BMS  to LAD in 2010 which was complicated by cardiac arrest. He denies chest pain, dyspnea, or other symptoms concerning for angina.  No indication for further ischemic evaluation at this time. No bleeding problems. He is not on aspirin in the setting of OAC. LDL cholesterol is well controlled. Focus on secondary prevention including heart healthy mostly plant based diet avoiding saturated fat, processed foods, simple carbohydrates, and sugar along with aiming for at least 150 minutes of moderate intensity exercise each week.   PAF on chronic anticoagulation: No evidence of atrial fibrillation on cardiac monitor completed 12/06/2022. No reported symptoms of irregular or fast heartbeat. Discussed the possibility of discontinuing Eliquis  if no Afib is detected on future monitoring. Consider repeating the monitor to check for Afib and review remote device checks. Is concerned about cost of Eliquis . Encouraged him to contact us  if cost prohibitive this month. Continue Eliquis  5 mg twice daily which  is appropriate dose for stroke prevention for CHA2DS2-VASc score of 6.   Carotid artery disease: Carotid bruit noted on right side. Carotid duplex completed 05/2020 with 1 to 39% bilateral stenosis. He is asymptomatic. Cholesterol is well controlled. We will continue to monitor clinically for now.  Hyperlipidemia LDL goal < 70: Lipid panel 04/04/2023 with total cholesterol 105, LDL 46, HDL 41 and triglycerides 93.  LDL is well-controlled.  Continue atorvastatin  40 mg daily.  Hypertension: BP is well controlled. We will recheck BMET today.   AICD implant: He underwent initial dual-chamber ICD implant following MI and subsequent ICM 10/2008, gen change 07/2016.  Most recent remote device check 06/16/2023 with normal device function.  No acute concerns today. Management per EP cardiology.       Dispo: 4 months with Dr. Kate (transfer from Dr. Burke 2025 with Dr. Fernande  Signed, Rosaline Bane, NP-C

## 2023-06-15 ENCOUNTER — Ambulatory Visit (INDEPENDENT_AMBULATORY_CARE_PROVIDER_SITE_OTHER): Payer: PPO

## 2023-06-15 DIAGNOSIS — I495 Sick sinus syndrome: Secondary | ICD-10-CM

## 2023-06-16 LAB — CUP PACEART REMOTE DEVICE CHECK
Battery Remaining Longevity: 41 mo
Battery Remaining Percentage: 40 %
Battery Voltage: 2.92 V
Brady Statistic RV Percent Paced: 0 %
Date Time Interrogation Session: 20250110020017
HighPow Impedance: 75 Ohm
HighPow Impedance: 75 Ohm
Lead Channel Impedance Value: 350 Ohm
Lead Channel Pacing Threshold Amplitude: 1.25 V
Lead Channel Pacing Threshold Pulse Width: 0.6 ms
Lead Channel Sensing Intrinsic Amplitude: 11.7 mV
Lead Channel Setting Pacing Amplitude: 2.5 V
Lead Channel Setting Pacing Pulse Width: 0.6 ms
Lead Channel Setting Sensing Sensitivity: 0.5 mV
Pulse Gen Serial Number: 7402114

## 2023-06-18 ENCOUNTER — Ambulatory Visit: Payer: PPO | Attending: Nurse Practitioner | Admitting: Nurse Practitioner

## 2023-06-18 ENCOUNTER — Encounter: Payer: Self-pay | Admitting: Nurse Practitioner

## 2023-06-18 VITALS — BP 118/62 | HR 60 | Ht 74.0 in | Wt 207.8 lb

## 2023-06-18 DIAGNOSIS — I5022 Chronic systolic (congestive) heart failure: Secondary | ICD-10-CM

## 2023-06-18 DIAGNOSIS — I480 Paroxysmal atrial fibrillation: Secondary | ICD-10-CM | POA: Diagnosis not present

## 2023-06-18 DIAGNOSIS — Z7901 Long term (current) use of anticoagulants: Secondary | ICD-10-CM

## 2023-06-18 DIAGNOSIS — I255 Ischemic cardiomyopathy: Secondary | ICD-10-CM | POA: Diagnosis not present

## 2023-06-18 DIAGNOSIS — I6523 Occlusion and stenosis of bilateral carotid arteries: Secondary | ICD-10-CM

## 2023-06-18 DIAGNOSIS — I2510 Atherosclerotic heart disease of native coronary artery without angina pectoris: Secondary | ICD-10-CM

## 2023-06-18 DIAGNOSIS — Z95810 Presence of automatic (implantable) cardiac defibrillator: Secondary | ICD-10-CM

## 2023-06-18 DIAGNOSIS — I10 Essential (primary) hypertension: Secondary | ICD-10-CM

## 2023-06-18 LAB — BASIC METABOLIC PANEL
BUN/Creatinine Ratio: 16 (ref 10–24)
BUN: 20 mg/dL (ref 8–27)
CO2: 22 mmol/L (ref 20–29)
Calcium: 9.6 mg/dL (ref 8.6–10.2)
Chloride: 105 mmol/L (ref 96–106)
Creatinine, Ser: 1.28 mg/dL — ABNORMAL HIGH (ref 0.76–1.27)
Glucose: 96 mg/dL (ref 70–99)
Potassium: 5.4 mmol/L — ABNORMAL HIGH (ref 3.5–5.2)
Sodium: 141 mmol/L (ref 134–144)
eGFR: 60 mL/min/{1.73_m2} (ref >59)

## 2023-06-18 NOTE — Patient Instructions (Signed)
 Medication Instructions:   Your physician recommends that you continue on your current medications as directed. Please refer to the Current Medication list given to you today.   *If you need a refill on your cardiac medications before your next appointment, please call your pharmacy*   Lab Work:  TODAY!!!! BMET  If you have labs (blood work) drawn today and your tests are completely normal, you will receive your results only by: MyChart Message (if you have MyChart) OR A paper copy in the mail If you have any lab test that is abnormal or we need to change your treatment, we will call you to review the results.   Testing/Procedures:  Your physician has requested that you have an echocardiogram. Echocardiography is a painless test that uses sound waves to create images of your heart. It provides your doctor with information about the size and shape of your heart and how well your heart's chambers and valves are working. This procedure takes approximately one hour. There are no restrictions for this procedure. Please do NOT wear cologne, aftershave, or lotions (deodorant is allowed). Please arrive 15 minutes prior to your appointment time.  Follow-Up: At [facility], you and your health needs are our priority.  As part of our continuing mission to provide you with exceptional heart care, we have created designated Provider Care Teams.  These Care Teams include your primary Cardiologist (physician) and Advanced Practice Providers (APPs -  Physician Assistants and Nurse Practitioners) who all work together to provide you with the care you need, when you need it.  We recommend signing up for the patient portal called MyChart.  Sign up information is provided on this After Visit Summary.  MyChart is used to connect with patients for Virtual Visits (Telemedicine).  Patients are able to view lab/test results, encounter notes, upcoming appointments, etc.  Non-urgent messages can be sent  to your provider as well.   To learn more about what you can do with MyChart, go to [email or web address].    Your next appointment:   4 month(s)  Provider:   Dr. Kate    Other Instructions    1st Floor: - Lobby - Registration  - Pharmacy  - Lab - Cafe  2nd Floor: - PV Lab - Diagnostic Testing (echo, CT, nuclear med)  3rd Floor: - Vacant  4th Floor: - TCTS (cardiothoracic surgery) - AFib Clinic - Structural Heart Clinic - Vascular Surgery  - Vascular Ultrasound  5th Floor: - HeartCare Cardiology (general and EP) - Clinical Pharmacy for coumadin, hypertension, lipid, weight-loss medications, and med management appointments    Valet parking services will be available as well.

## 2023-06-20 ENCOUNTER — Other Ambulatory Visit (HOSPITAL_BASED_OUTPATIENT_CLINIC_OR_DEPARTMENT_OTHER): Payer: Self-pay | Admitting: *Deleted

## 2023-06-20 DIAGNOSIS — R748 Abnormal levels of other serum enzymes: Secondary | ICD-10-CM

## 2023-06-20 MED ORDER — SPIRONOLACTONE 25 MG PO TABS: 12.5000 mg | ORAL_TABLET | Freq: Every day | ORAL | Status: DC

## 2023-06-21 MED ORDER — ENTRESTO 97-103 MG PO TABS: ORAL_TABLET | ORAL | 3 refills | Status: DC

## 2023-06-21 NOTE — Addendum Note (Signed)
Addended by: Malena Peer D on: 06/21/2023 09:56 AM   Modules accepted: Orders

## 2023-06-26 LAB — BASIC METABOLIC PANEL
BUN/Creatinine Ratio: 9 — ABNORMAL LOW (ref 10–24)
BUN: 12 mg/dL (ref 8–27)
CO2: 23 mmol/L (ref 20–29)
Calcium: 8.9 mg/dL (ref 8.6–10.2)
Chloride: 106 mmol/L (ref 96–106)
Creatinine, Ser: 1.28 mg/dL — ABNORMAL HIGH (ref 0.76–1.27)
Glucose: 99 mg/dL (ref 70–99)
Potassium: 4.5 mmol/L (ref 3.5–5.2)
Sodium: 141 mmol/L (ref 134–144)
eGFR: 60 mL/min/{1.73_m2} (ref >59)

## 2023-07-05 ENCOUNTER — Ambulatory Visit (HOSPITAL_COMMUNITY): Payer: PPO | Attending: Cardiology

## 2023-07-05 DIAGNOSIS — I255 Ischemic cardiomyopathy: Secondary | ICD-10-CM | POA: Diagnosis present

## 2023-07-05 DIAGNOSIS — I5022 Chronic systolic (congestive) heart failure: Secondary | ICD-10-CM | POA: Insufficient documentation

## 2023-07-05 LAB — ECHOCARDIOGRAM COMPLETE
Area-P 1/2: 3.03 cm2
Calc EF: 39.1 %
MV M vel: 4.71 m/s
MV Peak grad: 88.7 mm[Hg]
S' Lateral: 3.97 cm
Single Plane A2C EF: 39.4 %
Single Plane A4C EF: 40.5 %

## 2023-07-05 MED ORDER — PERFLUTREN LIPID MICROSPHERE
1.0000 mL | INTRAVENOUS | Status: AC | PRN
Start: 2023-07-05 — End: 2023-07-05
  Administered 2023-07-05: 5 mL via INTRAVENOUS

## 2023-07-24 NOTE — Addendum Note (Signed)
 Addended by: Elease Etienne A on: 07/24/2023 11:34 AM   Modules accepted: Orders

## 2023-07-24 NOTE — Progress Notes (Signed)
 [facility] Initial Consultation   Referring Provider [place], [place], [place] [address] [place],  [place] [address]  Primary Care Provider [place], [place], [place]  Patient Profile: Mike Cole is a 73 y.o. male who is seen in consultation in the [facility] at the request of Dr. Hyacinth Meeker for evaluation and management of the problem(s) noted below.  Problem List: Colon cancer screening-FOBT positive   History of Present Illness   Mike Cole is a 73 y.o. male with a history of HLD, cardiomyopathy (EF 25-30% on ECHO 06/2023), MI and ventricular tachycardia in 2010 status post pacemaker/AICD, HTN, TIA on Eliquis who is referred to the office regarding positive FOBT testing and consideration of colonoscopy.  In speaking with Mike Cole as well as reviewing his prior medical records it is noteworthy that he has never had a colonoscopy. He has a significant cardiac history and therefore has undergone colon cancer screening with stool testing.  FOBT test 1 year ago was positive No family history of colorectal cancer or polyps He denies change in bowel habits or blood in his stool Labs have not shown anemia-last hemoglobin 14.8  As of his cardiac history, he incurred an MI in 2010 Previous history of ventricular tachycardia for which she has an AICD and pacemaker -his AICD has not triggered Has heart failure with most recent EF 25 to 30% on echo 06/2023 Has been on Jardiance for the last year States that on "good days" he can walk several miles and up to 10,000 steps a day On "bad days" he feels very fatigued He is busy working on his car  Records indicate a previous history of TIA documented back to 2011 He tells me he may have had a TIA in March 2024 He is on Eliquis and denies any bleeding problems  Denies any other GI symptoms  Last colonoscopy: None Last endoscopy: None  Last Abd CT/CTE/MRE: None  GI Review of Symptoms Significant for  None. Otherwise negative.  General Review of Systems  Review of systems is significant for the pertinent positives and negatives as listed per the HPI.  Full ROS is otherwise negative.  Past Medical History   Past Medical History:  Diagnosis Date   Acute anterior myocardial infarction (HCC)    BMS to the LAD   Atrial fibrillation (HCC)    Cardiogenic shock (HCC)    resolved   CHF (congestive heart failure) (HCC)    Coronary artery disease    Dyslipidemia    History of transient ischemic attack    Ischemic cardiomyopathy    EF of 20-25%   Kidney stones    Ventricular tachycardia (HCC)    arrest, resuscitated     Past Surgical History   Past Surgical History:  Procedure Laterality Date   CATARACT EXTRACTION, BILATERAL  2024   EP IMPLANTABLE DEVICE N/A 07/07/2016   Procedure: ICD Generator Changeout;  Surgeon: Duke Salvia, MD;  Location: [facility] INVASIVE CV LAB;  Service: Cardiovascular;  Laterality: N/A;   St Jude CURRENT + DR  10/30/2008     Allergies and Medications  No Known Allergies    Current Meds  Medication Sig   apixaban (ELIQUIS) 5 MG TABS tablet Take 1 tablet (5 mg total) by mouth 2 (two) times daily.   atorvastatin (LIPITOR) 40 MG tablet TAKE 1 TABLET BY MOUTH EVERY DAY AT 6PM   carvedilol (COREG) 3.125 MG tablet Take 1 tablet (3.125 mg total) by mouth 2 (two) times daily  with a meal.   empagliflozin (JARDIANCE) 10 MG TABS tablet Take 1 tablet (10 mg total) by mouth daily before breakfast.   Lecith-Inosi-Chol-B12-Liver (LIVERITE) 1.125 MCG TABS Take 1.125 mcg by mouth 2 (two) times daily.   NON FORMULARY Relief Factor take 4 capsules bid   sacubitril-valsartan (ENTRESTO) 97-103 MG Take one tablet by mouth twice a day. Pharmacy please [facility] Billing Information: [id number], [id number], Group: [id number], [id number]   spironolactone (ALDACTONE) 25 MG tablet Take 0.5 tablets (12.5 mg total) by mouth daily.    [EXPIRED] SUFLAVE 178.7 g SOLR Take 1 kit by mouth once for 1 dose.     Family History   Family History  Problem Relation Age of Onset   CAD Mother    CAD Father    CAD Sister    CAD Brother    Heart disease Brother        30's, quadruple bypass   CAD Brother    Heart attack Brother    Lung cancer Maternal Uncle    Lung cancer Maternal Aunt      Social History   Social History   Tobacco Use   Smoking status: Former    Current packs/day: 0.00    Types: Cigarettes    Quit date: 06/05/2008    Years since quitting: 15.1   Smokeless tobacco: Never  Vaping Use   Vaping status: Never Used  Substance Use Topics   Alcohol use: Not Currently    Comment: occ   Drug use: No   Mike Cole reports that he quit smoking about 15 years ago. His smoking use included cigarettes. He has never used smokeless tobacco. He reports that he does not currently use alcohol. He reports that he does not use drugs.  Vital Signs and Physical Examination   Vitals:   07/25/23 0957  BP: (!) 132/50  Pulse: 72   Body mass index is 28.64 kg/m. Weight: 208 lb 4 oz (94.5 kg)  General: Well developed, well nourished, no acute distress Head: Normocephalic and atraumatic Eyes: Sclerae anicteric, EOMI Lungs: Clear throughout to auscultation Heart: Regular rate and rhythm; No murmurs, rubs or bruits Abdomen: Soft, non tender and non distended. No masses, hepatosplenomegaly or hernias noted. Normal Bowel sounds Rectal: Deferred Musculoskeletal: Symmetrical with no gross deformities    Review of Data  The following data was reviewed at the time of this encounter:  Laboratory Studies      Latest Ref Rng & Units 11/01/2022    8:06 AM 12/07/2021    2:20 PM 05/10/2020    9:23 AM  CBC  WBC 4.0 - 10.5 K/uL 3.9  4.7  3.8   Hemoglobin 13.0 - 17.0 g/dL 63.8  75.6  43.3   Hematocrit 39.0 - 52.0 % 44.0  43.0  39.9   Platelets 150 - 400 K/uL 158  153  150     No results found for: "LIPASE"    Latest Ref  Rng & Units 06/26/2023    9:48 AM 06/18/2023    9:43 AM 11/21/2022    9:59 AM  CMP  Glucose 70 - 99 mg/dL 99  96  295   BUN 8 - 27 mg/dL 12  20  12    Creatinine 0.76 - 1.27 mg/dL 1.88  4.16  6.06   Sodium 134 - 144 mmol/L 141  141  139   Potassium 3.5 - 5.2 mmol/L 4.5  5.4  4.4   Chloride 96 - 106 mmol/L 106  105  103   CO2 20 - 29 mmol/L 23  22  24    Calcium 8.6 - 10.2 mg/dL 8.9  9.6  9.1      Imaging Studies  ECHO 06/2023   GI Procedures and Studies  None    Clinical Impression  It is my clinical impression that Mike Cole is a 73 y.o. male witha history of HLD, cardiomyopathy (EF 25-30% on ECHO 06/2023), MI and ventricular tachycardia in 2010 status post pacemaker/AICD, HTN, TIA on Eliquis who is referred to the office regarding;   Positive FOBT testing and consideration of colonoscopy  Mike Cole underwent stool testing for colon cancer screening and had a positive FOBT test.  He has a significant cardiac history including CAD, cardiomyopathy with a recent EF of 25 to 30%, pacemaker, AICD and TIA on Eliquis.  Discussed that his cardiac issues place him at higher risk for anesthesia undergoing colonoscopy.  In speaking with him regarding his functional status he is relatively well optimized and on good days can walk several miles and up to 10,000 steps.  He is interested in undergoing colonoscopy as he is concerned regarding his positive stool test.  We reviewed potential risks of anesthesia, aspiration, perforation and bleeding and he is amenable to proceeding.  In light of his medical comorbidities his procedure will need to be scheduled in a hospital-based procedure unit and we will communicate with his medical team regarding holding Eliquis for 48 hours prior to his procedure.  Plan  Schedule colonoscopy at hospital-based unit Communicate with cardiology/medical team regarding holding Eliquis x 48 hours prior to colonoscopy.  In light of significant cardiac history would recommend  obtaining cardiac clearance/restratification prior to procedure.  Planned Follow Up PRN  The patient or caregiver verbalized understanding of the material covered, with no barriers to understanding. All questions were answered. Patient or caregiver is agreeable with the plan outlined above.    It was a pleasure to see Mike Cole.  If you have any questions or concerns regarding this evaluation, do not hesitate to contact me.  Maren Beach, MD [facility]

## 2023-07-24 NOTE — Progress Notes (Signed)
 Remote ICD transmission.

## 2023-07-25 ENCOUNTER — Telehealth: Payer: Self-pay

## 2023-07-25 ENCOUNTER — Encounter: Payer: Self-pay | Admitting: Pediatrics

## 2023-07-25 ENCOUNTER — Ambulatory Visit: Payer: PPO | Admitting: Pediatrics

## 2023-07-25 VITALS — BP 132/50 | HR 72 | Ht 71.5 in | Wt 208.2 lb

## 2023-07-25 DIAGNOSIS — Z1211 Encounter for screening for malignant neoplasm of colon: Secondary | ICD-10-CM

## 2023-07-25 DIAGNOSIS — R195 Other fecal abnormalities: Secondary | ICD-10-CM | POA: Diagnosis not present

## 2023-07-25 MED ORDER — SUFLAVE 178.7 G PO SOLR: 1.0000 | Freq: Once | ORAL | 0 refills | Status: AC

## 2023-07-25 NOTE — Telephone Encounter (Signed)
 Please advise holding Eliquis prior to colonoscopy on 09/17/2023.  Thank you!  DW

## 2023-07-25 NOTE — Telephone Encounter (Signed)
 Marcelino Duster,   You saw this patient on 06/18/2023. Per office protocol, will you please comment on medical clearance for colonoscopy?  Please route your response to P CV DIV Preop. I will communicate with requesting office once you have given recommendations.   Thank you!  Carlos Levering, NP

## 2023-07-25 NOTE — Patient Instructions (Signed)
 You have been scheduled for a colonoscopy. Please follow written instructions given to you at your visit today.   If you use inhalers (even only as needed), please bring them with you on the day of your procedure.  DO NOT TAKE 7 DAYS PRIOR TO TEST- Trulicity (dulaglutide) Ozempic, Wegovy (semaglutide) Mounjaro (tirzepatide) Bydureon Bcise (exanatide extended release)  DO NOT TAKE 1 DAY PRIOR TO YOUR TEST Rybelsus (semaglutide) Adlyxin (lixisenatide) Victoza (liraglutide) Byetta (exanatide) ___________________________________________________________________________  Mike Cole will receive your bowel preparation through [facility], which ensures the lowest copay and home delivery, with outreach via text or call from an [phone] number. Please respond promptly to avoid rescheduling of your procedure. If you are interested in alternative options or have any questions regarding your prep, please contact them at [phone] ____________________________________________________________________________  Your Provider Has Sent Your Bowel Prep Regimen To [facility]   [facility] will contact you to verify your information and collect your copay, if applicable. Enjoy the comfort of your home while your prescription is mailed to you, FREE of any shipping charges.   [facility] accepts all major insurance benefits and applies discounts & coupons.  Have additional questions?   Chat: [email or web address] Call: [phone] Email: [email or web address] NCPDP: [id number]  How will [facility] contact you?  With a Welcome phone call,  a Welcome text and a checkout link in text form.  Texts you receive from [phone] Are NOT Spam.  *To set up delivery, you must complete the checkout process via link or speak to one of the patient care representatives. If [facility] is unable to reach you, your prescription may be delayed.  To avoid long hold times on the phone, you may also utilize the secure chat  feature on the [facility] website to request that they call you back for transaction completion or to expedite your concerns.   _______________________________________________________  If your blood pressure at your visit was 140/90 or greater, please contact your primary care physician to follow up on this.  _______________________________________________________  If you are age 70 or older, your body mass index should be between 23-30. Your Body mass index is 28.64 kg/m. If this is out of the aforementioned range listed, please consider follow up with your Primary Care Provider.  If you are age 35 or younger, your body mass index should be between 19-25. Your Body mass index is 28.64 kg/m. If this is out of the aformentioned range listed, please consider follow up with your Primary Care Provider.   ________________________________________________________  The [facility] providers would like to encourage you to use [facility] to communicate with providers for non-urgent requests or questions.  Due to long hold times on the telephone, sending your provider a message by [facility] may be a faster and more efficient way to get a response.  Please allow 48 business hours for a response.  Please remember that this is for non-urgent requests.  _______________________________________________________  Thank you for entrusting me with your care and for choosing [facility], Dr. Maren Beach

## 2023-07-25 NOTE — Telephone Encounter (Signed)
 Request for surgical clearance:     Endoscopy Procedure  What type of surgery is being performed?     Colonoscopy  When is this surgery scheduled?     09/17/23  What type of clearance is required ?   Pharmacy/ Cardiac Clearance  Are there any medications that need to be held prior to surgery and how long? Eliquis x2  Practice name and name of physician performing surgery?      [facility]  What is your office phone and fax number?      Phone- [phone]  Fax- [phone]  Anesthesia type (None, local, MAC, general) ?       MAC  Please route your response to [employer], CMA

## 2023-07-26 ENCOUNTER — Telehealth: Payer: Self-pay

## 2023-07-26 NOTE — Telephone Encounter (Signed)
   Primary Cardiologist: Lance Muss, MD  Chart reviewed as part of pre-operative protocol coverage. Given past medical history and time since last visit, based on ACC/AHA guidelines, Mike Cole would be at acceptable risk for the planned procedure without further cardiovascular testing.    At the time of his office visit on 06/18/2023, he was able to achieve > 4 METS activity without concerning cardiac symptoms. His RCRI is elevated at 10%  due to history of ischemic cardiomyopathy and ischemic heart disease, but he is optimally medically managed and reported no concerning symptoms.   Patient was advised that if he develops new symptoms prior to surgery to contact our office to arrange a follow-up appointment.  He verbalized understanding.  I will route this recommendation to the requesting party via Epic fax function and remove from pre-op pool.  Please call with questions.  Levi Aland, NP-C 07/26/2023, 6:47 AM [address] Office [phone] Fax [phone]

## 2023-07-26 NOTE — Telephone Encounter (Signed)
 Patient with diagnosis of afib on Eliquis for anticoagulation.    Procedure: Colonoscopy  Date of procedure: 09/17/23   CHA2DS2-VASc Score = 6   This indicates a 9.7% annual risk of stroke. The patient's score is based upon: CHF History: 1 HTN History: 1 Diabetes History: 0 Stroke History: 2 Vascular Disease History: 1 Age Score: 1 Gender Score: 0     CrCl 71 mL/min Platelet count 158 K    Per office protocol, patient can hold Eliquis for 2 days prior to procedure.     **This guidance is not considered finalized until pre-operative APP has relayed final recommendations.**

## 2023-07-26 NOTE — Telephone Encounter (Signed)
   Patient Name: Mike Cole  DOB: Oct 03, 1950 MRN: [id number]  Primary Cardiologist: Lance Muss, MD  Chart reviewed as part of pre-operative protocol coverage. Given past medical history and time since last visit, based on ACC/AHA guidelines, Mike Cole is at acceptable risk for the planned procedure without further cardiovascular testing. He is able to complete < 4 METS.  Stable from CV standpoint.  Per office protocol, patient can hold Eliquis for 2 days prior to procedure.Please restart as soon as safe at the discretion of the surgeon.    The patient was advised that if he develops new symptoms prior to surgery to contact our office to arrange for a follow-up visit, and he verbalized understanding.  I will route this recommendation to the requesting party via Epic fax function and remove from pre-op pool.  Please call with questions.  Joni Reining, NP 07/26/2023, 12:50 PM

## 2023-07-26 NOTE — Telephone Encounter (Signed)
 I called Mike Cole and I advised him that he was cleared by his cardiologist office to stop his Eliquis 2 days prior to his procedure on 4/14 with Dr. Doy Hutching.

## 2023-08-01 ENCOUNTER — Encounter: Payer: Self-pay | Admitting: Internal Medicine

## 2023-08-15 ENCOUNTER — Other Ambulatory Visit: Payer: Self-pay | Admitting: Interventional Cardiology

## 2023-08-25 ENCOUNTER — Other Ambulatory Visit: Payer: Self-pay | Admitting: Nurse Practitioner

## 2023-09-10 ENCOUNTER — Telehealth: Payer: Self-pay

## 2023-09-10 ENCOUNTER — Encounter (HOSPITAL_COMMUNITY): Payer: Self-pay | Admitting: Pediatrics

## 2023-09-10 NOTE — Telephone Encounter (Signed)
 Procedure:colon Procedure date: 09/17/23 Procedure location: [facility] Arrival Time: 8am Spoke with the patient Y/N: y Any prep concerns? n  Has the patient obtained the prep from the pharmacy ? y Do you have a care partner and transportation: y Any additional concerns? n

## 2023-09-12 ENCOUNTER — Telehealth: Payer: Self-pay

## 2023-09-12 NOTE — Telephone Encounter (Signed)
 I spoke to [place] and he confirmed that he received a copy of the instructions on MYChart.  I reminded him that today he has to stop eating nuts, seeds, popcorn, corn, peas, beans, salads, and any raw vegetables.  I also reminded him to not take any fiber supplements or iron supplements.  And the last reminder I gave him was to hold his Eliquis starting 4/12.  He will call us back if he has any other questions.

## 2023-09-12 NOTE — Telephone Encounter (Signed)
 LMTCB.  (Re: colonoscopy procedure instructions review)

## 2023-09-14 ENCOUNTER — Ambulatory Visit (INDEPENDENT_AMBULATORY_CARE_PROVIDER_SITE_OTHER): Payer: PPO

## 2023-09-14 DIAGNOSIS — I255 Ischemic cardiomyopathy: Secondary | ICD-10-CM

## 2023-09-15 LAB — CUP PACEART REMOTE DEVICE CHECK
Battery Remaining Longevity: 39 mo
Battery Remaining Percentage: 38 %
Battery Voltage: 2.9 V
Brady Statistic RV Percent Paced: 0 %
Date Time Interrogation Session: 20250411020017
HighPow Impedance: 65 Ohm
HighPow Impedance: 65 Ohm
Lead Channel Impedance Value: 340 Ohm
Lead Channel Pacing Threshold Amplitude: 1.25 V
Lead Channel Pacing Threshold Pulse Width: 0.6 ms
Lead Channel Sensing Intrinsic Amplitude: 11.7 mV
Lead Channel Setting Pacing Amplitude: 2.5 V
Lead Channel Setting Pacing Pulse Width: 0.6 ms
Lead Channel Setting Sensing Sensitivity: 0.5 mV
Pulse Gen Serial Number: 7402114

## 2023-09-17 ENCOUNTER — Encounter (HOSPITAL_COMMUNITY)
Admission: RE | Disposition: A | Discharge status: Home / Self Care | Payer: Self-pay | Source: Home / Self Care | Attending: Pediatrics

## 2023-09-17 ENCOUNTER — Encounter (HOSPITAL_COMMUNITY): Payer: Self-pay | Admitting: Pediatrics

## 2023-09-17 ENCOUNTER — Other Ambulatory Visit: Payer: Self-pay

## 2023-09-17 ENCOUNTER — Ambulatory Visit (HOSPITAL_COMMUNITY): Admitting: Anesthesiology

## 2023-09-17 ENCOUNTER — Ambulatory Visit (HOSPITAL_COMMUNITY)
Admission: RE | Admit: 2023-09-17 | Discharge: 2023-09-17 | Disposition: A | Discharge status: Home / Self Care | Payer: PPO | Attending: Pediatrics | Admitting: Pediatrics

## 2023-09-17 DIAGNOSIS — Z7901 Long term (current) use of anticoagulants: Secondary | ICD-10-CM | POA: Diagnosis not present

## 2023-09-17 DIAGNOSIS — K635 Polyp of colon: Secondary | ICD-10-CM | POA: Diagnosis not present

## 2023-09-17 DIAGNOSIS — K621 Rectal polyp: Secondary | ICD-10-CM | POA: Diagnosis not present

## 2023-09-17 DIAGNOSIS — K5730 Diverticulosis of large intestine without perforation or abscess without bleeding: Secondary | ICD-10-CM

## 2023-09-17 DIAGNOSIS — I2510 Atherosclerotic heart disease of native coronary artery without angina pectoris: Secondary | ICD-10-CM | POA: Insufficient documentation

## 2023-09-17 DIAGNOSIS — D123 Benign neoplasm of transverse colon: Secondary | ICD-10-CM | POA: Diagnosis not present

## 2023-09-17 DIAGNOSIS — R195 Other fecal abnormalities: Secondary | ICD-10-CM

## 2023-09-17 DIAGNOSIS — Z87891 Personal history of nicotine dependence: Secondary | ICD-10-CM | POA: Diagnosis not present

## 2023-09-17 DIAGNOSIS — Z1211 Encounter for screening for malignant neoplasm of colon: Secondary | ICD-10-CM | POA: Insufficient documentation

## 2023-09-17 DIAGNOSIS — I252 Old myocardial infarction: Secondary | ICD-10-CM | POA: Insufficient documentation

## 2023-09-17 DIAGNOSIS — I509 Heart failure, unspecified: Secondary | ICD-10-CM | POA: Diagnosis not present

## 2023-09-17 DIAGNOSIS — D125 Benign neoplasm of sigmoid colon: Secondary | ICD-10-CM | POA: Diagnosis not present

## 2023-09-17 DIAGNOSIS — D124 Benign neoplasm of descending colon: Secondary | ICD-10-CM

## 2023-09-17 HISTORY — PX: POLYPECTOMY: SHX149

## 2023-09-17 HISTORY — PX: COLONOSCOPY WITH PROPOFOL: SHX5780

## 2023-09-17 SURGERY — COLONOSCOPY WITH PROPOFOL
Anesthesia: Monitor Anesthesia Care

## 2023-09-17 MED ORDER — SODIUM CHLORIDE 0.9 % IV SOLN: INTRAVENOUS | Status: DC

## 2023-09-17 MED ORDER — EPHEDRINE SULFATE-NACL 50-0.9 MG/10ML-% IV SOSY
PREFILLED_SYRINGE | INTRAVENOUS | Status: DC | PRN
  Administered 2023-09-17 (×2): 7.5 mg via INTRAVENOUS

## 2023-09-17 MED ORDER — PHENYLEPHRINE 80 MCG/ML (10ML) SYRINGE FOR IV PUSH (FOR BLOOD PRESSURE SUPPORT)
PREFILLED_SYRINGE | INTRAVENOUS | Status: DC | PRN
Start: 2023-09-17 — End: 2023-09-17
  Administered 2023-09-17: 80 ug via INTRAVENOUS

## 2023-09-17 MED ORDER — SODIUM CHLORIDE 0.9 % IV SOLN: INTRAVENOUS | Status: DC | PRN

## 2023-09-17 MED ORDER — PROPOFOL 10 MG/ML IV BOLUS
INTRAVENOUS | Status: AC
  Filled 2023-09-17: qty 20

## 2023-09-17 MED ORDER — PROPOFOL 10 MG/ML IV BOLUS
INTRAVENOUS | Status: DC | PRN
  Administered 2023-09-17: 160 ug/kg/min via INTRAVENOUS
  Administered 2023-09-17: 30 mg via INTRAVENOUS

## 2023-09-17 MED ORDER — LIDOCAINE 2% (20 MG/ML) 5 ML SYRINGE
INTRAMUSCULAR | Status: DC | PRN
  Administered 2023-09-17: 60 mg via INTRAVENOUS

## 2023-09-17 SURGICAL SUPPLY — 20 items

## 2023-09-17 NOTE — H&P (Signed)
 [facility] History and Physical   Primary Care Physician:  Hyacinth Meeker, [place], [place]   Reason for Procedure:  Positive FOBT test  Plan:    Colonoscopy     HPI: Mike Cole is a 73 y.o. male undergoing colonoscopy for evaluation of positive FOBT test.  No history of prior colonoscopy.  No family history of colorectal cancer or polyps.  He denies change in bowel habits or rectal bleeding.  Mr. Dougal takes Eliquis and reports that his last dose of Eliquis was 09/14/2023.   Past Medical History:  Diagnosis Date   Acute anterior myocardial infarction (HCC)    BMS to the LAD   Atrial fibrillation (HCC)    Cardiogenic shock (HCC)    resolved   CHF (congestive heart failure) (HCC)    Coronary artery disease    Dyslipidemia    History of transient ischemic attack    Ischemic cardiomyopathy    EF of 20-25%   Kidney stones    Ventricular tachycardia (HCC)    arrest, resuscitated    Past Surgical History:  Procedure Laterality Date   CATARACT EXTRACTION, BILATERAL  2024   EP IMPLANTABLE DEVICE N/A 07/07/2016   Procedure: ICD Generator Changeout;  Surgeon: Duke Salvia, MD;  Location: [facility] INVASIVE CV LAB;  Service: Cardiovascular;  Laterality: N/A;   St Jude CURRENT + DR  10/30/2008    Prior to Admission medications   Medication Sig Start Date End Date Taking? Authorizing Provider  atorvastatin (LIPITOR) 40 MG tablet Take 1 tablet (40 mg total) by mouth daily. 08/27/23  Yes Little Ishikawa, MD  carvedilol (COREG) 3.125 MG tablet Take 1 tablet (3.125 mg total) by mouth 2 (two) times daily with a meal. 08/15/23  Yes Little Ishikawa, MD  empagliflozin (JARDIANCE) 10 MG TABS tablet Take 1 tablet (10 mg total) by mouth daily before breakfast. 02/23/23  Yes Duke Salvia, MD  Lecith-Inosi-Chol-B12-Liver (LIVERITE) 1.125 MCG TABS Take 1.125 mcg by mouth 2 (two) times daily.   Yes [provider]  NON FORMULARY Relief Factor take 4 capsules bid   Yes  [provider]  sacubitril-valsartan (ENTRESTO) 97-103 MG Take one tablet by mouth twice a day. Pharmacy please [facility] as [facility] Billing Information: [id number], [id number], Group: [id number], [id number] 06/21/23  Yes Swinyer, [name], NP  spironolactone (ALDACTONE) 25 MG tablet Take 0.5 tablets (12.5 mg total) by mouth daily. 06/20/23  Yes Swinyer, [name], NP  apixaban (ELIQUIS) 5 MG TABS tablet Take 1 tablet (5 mg total) by mouth 2 (two) times daily. 05/31/23   Duke Salvia, MD    No current facility-administered medications for this encounter.    Allergies as of 07/25/2023   (No Known Allergies)    Family History  Problem Relation Age of Onset   CAD Mother    CAD Father    CAD Sister    CAD Brother    Heart disease Brother        30's, quadruple bypass   CAD Brother    Heart attack Brother    Lung cancer Maternal Uncle    Lung cancer Maternal Aunt     Social History   Socioeconomic History   Marital status: Married    Spouse name: Not on file   Number of children: 3   Years of education: Not on file   Highest education level: Not on file  Occupational History   Occupation: retired  Tobacco Use  Smoking status: Former    Current packs/day: 0.00    Types: Cigarettes    Quit date: 06/05/2008    Years since quitting: 15.2   Smokeless tobacco: Never  Vaping Use   Vaping status: Never Used  Substance and Sexual Activity   Alcohol use: Not Currently    Comment: occ   Drug use: No   Sexual activity: Not on file  Other Topics Concern   Not on file  Social History Narrative   Not on file   Social Drivers of Health   Financial Resource Strain: Not on file  Food Insecurity: Not on file  Transportation Needs: Not on file  Physical Activity: Not on file  Stress: Not on file  Social Connections: Not on file  Intimate Partner Violence: Not on file    Review of Systems:  All other review of systems negative  except as mentioned in the HPI.  Physical Exam: Vital signs BP 122/67   Pulse 64   Temp 99 F (37.2 C) (Tympanic)   Resp 12   Ht 5\' 11"  (1.803 m)   Wt 92.1 kg   SpO2 100%   BMI 28.32 kg/m   General:   Alert,  Well-developed, well-nourished, pleasant and cooperative in NAD Lungs:  Clear throughout to auscultation.   Heart:  Regular rate and rhythm; no murmurs, clicks, rubs,  or gallops. Abdomen:  Soft, nontender and nondistended. Normal bowel sounds.   Neuro/Psych:  Normal mood and affect. A and O x 3  [name], MD [facility]

## 2023-09-17 NOTE — Anesthesia Postprocedure Evaluation (Signed)
 Anesthesia Post Note  Patient: Mike Cole  Procedure(s) Performed: COLONOSCOPY WITH PROPOFOL POLYPECTOMY, INTESTINE     Patient location during evaluation: PACU Anesthesia Type: MAC Level of consciousness: awake and alert Pain management: pain level controlled Vital Signs Assessment: post-procedure vital signs reviewed and stable Respiratory status: spontaneous breathing, nonlabored ventilation, respiratory function stable and patient connected to nasal cannula oxygen Cardiovascular status: stable and blood pressure returned to baseline Postop Assessment: no apparent nausea or vomiting Anesthetic complications: no   No notable events documented.  Last Vitals:  Vitals:   09/17/23 0920 09/17/23 0930  BP: (!) 104/49 (!) 115/52  Pulse: (!) 56 (!) 54  Resp: 15 17  Temp:    SpO2: 98% 100%    Last Pain:  Vitals:   09/17/23 0930  TempSrc:   PainSc: 0-No pain                 Theotis Flake P Sofya Moustafa

## 2023-09-17 NOTE — Transfer of Care (Signed)
 Immediate Anesthesia Transfer of Care Note  Patient: Mike Cole  Procedure(s) Performed: COLONOSCOPY WITH PROPOFOL POLYPECTOMY, INTESTINE  Patient Location: PACU  Anesthesia Type:MAC  Level of Consciousness: oriented and drowsy  Airway & Oxygen Therapy: Patient Spontanous Breathing  Post-op Assessment: Report given to RN  Post vital signs: Reviewed and stable  Last Vitals:  Vitals Value Taken Time  BP 103/49 09/17/23 0917  Temp 36.2 C 09/17/23 0917  Pulse 134 09/17/23 0918  Resp 16 09/17/23 0918  SpO2 96 % 09/17/23 0918  Vitals shown include unfiled device data.  Last Pain:  Vitals:   09/17/23 0917  TempSrc: Temporal  PainSc: Asleep         Complications: No notable events documented.

## 2023-09-17 NOTE — Discharge Instructions (Signed)
 YOU HAD AN ENDOSCOPIC PROCEDURE TODAY: Refer to the procedure report and other information in the discharge instructions given to you for any specific questions about what was found during the examination. If this information does not answer your questions, please call [place] office at [phone] to clarify.   YOU SHOULD EXPECT: Some feelings of bloating in the abdomen. Passage of more gas than usual. Walking can help get rid of the air that was put into your GI tract during the procedure and reduce the bloating. If you had a lower endoscopy (such as a colonoscopy or flexible sigmoidoscopy) you may notice spotting of blood in your stool or on the toilet paper. Some abdominal soreness may be present for a day or two, also.  DIET: Your first meal following the procedure should be a light meal and then it is ok to progress to your normal diet. A half-sandwich or bowl of soup is an example of a good first meal. Heavy or fried foods are harder to digest and may make you feel nauseous or bloated. Drink plenty of fluids but you should avoid alcoholic beverages for 24 hours. If you had a esophageal dilation, please see attached instructions for diet.    ACTIVITY: Your care partner should take you home directly after the procedure. You should plan to take it easy, moving slowly for the rest of the day. You can resume normal activity the day after the procedure however YOU SHOULD NOT DRIVE, use power tools, machinery or perform tasks that involve climbing or major physical exertion for 24 hours (because of the sedation medicines used during the test).   SYMPTOMS TO REPORT IMMEDIATELY: A gastroenterologist can be reached at any hour. Please call [phone]  for any of the following symptoms:  Following lower endoscopy (colonoscopy, flexible sigmoidoscopy) Excessive amounts of blood in the stool  Significant tenderness, worsening of abdominal pains  Swelling of the abdomen that is new, acute  Fever of 100 or  higher  Following upper endoscopy (EGD, EUS, ERCP, esophageal dilation) Vomiting of blood or coffee ground material  New, significant abdominal pain  New, significant chest pain or pain under the shoulder blades  Painful or persistently difficult swallowing  New shortness of breath  Black, tarry-looking or red, bloody stools  FOLLOW UP:  If any biopsies were taken you will be contacted by phone or by letter within the next 1-3 weeks. Call [phone]  if you have not heard about the biopsies in 3 weeks.  Please also call with any specific questions about appointments or follow up tests.YOU HAD AN ENDOSCOPIC PROCEDURE TODAY: Refer to the procedure report and other information in the discharge instructions given to you for any specific questions about what was found during the examination. If this information does not answer your questions, please call [place] office at [phone] to clarify.   YOU SHOULD EXPECT: Some feelings of bloating in the abdomen. Passage of more gas than usual. Walking can help get rid of the air that was put into your GI tract during the procedure and reduce the bloating. If you had a lower endoscopy (such as a colonoscopy or flexible sigmoidoscopy) you may notice spotting of blood in your stool or on the toilet paper. Some abdominal soreness may be present for a day or two, also.  DIET: Your first meal following the procedure should be a light meal and then it is ok to progress to your normal diet. A half-sandwich or bowl of soup is an example of a  good first meal. Heavy or fried foods are harder to digest and may make you feel nauseous or bloated. Drink plenty of fluids but you should avoid alcoholic beverages for 24 hours. If you had a esophageal dilation, please see attached instructions for diet.    ACTIVITY: Your care partner should take you home directly after the procedure. You should plan to take it easy, moving slowly for the rest of the day. You can resume  normal activity the day after the procedure however YOU SHOULD NOT DRIVE, use power tools, machinery or perform tasks that involve climbing or major physical exertion for 24 hours (because of the sedation medicines used during the test).   SYMPTOMS TO REPORT IMMEDIATELY: A gastroenterologist can be reached at any hour. Please call [phone]  for any of the following symptoms:  Following lower endoscopy (colonoscopy, flexible sigmoidoscopy) Excessive amounts of blood in the stool  Significant tenderness, worsening of abdominal pains  Swelling of the abdomen that is new, acute  Fever of 100 or higher   FOLLOW UP:  If any biopsies were taken you will be contacted by phone or by letter within the next 1-3 weeks. Call [phone]  if you have not heard about the biopsies in 3 weeks.  Please also call with any specific questions about appointments or follow up tests.

## 2023-09-17 NOTE — Op Note (Signed)
 [facility] Patient Name: Mike Cole Procedure Date: 09/17/2023 MRN: [id number] Attending MD: Maren Beach , MD, [id number] Date of Birth: 1951/02/09 CSN: [id number] Age: 73 Admit Type: Outpatient Procedure:                Colonoscopy Indications:              This is the patient's first colonoscopy, Positive                            fecal immunochemical test Providers:                Maren Beach, MD, Fransisca Connors, Geoffery Lyons, Technician Referring MD:              Medicines:                Monitored Anesthesia Care Complications:            No immediate complications. Estimated Blood Loss:     Estimated blood loss was minimal. Procedure:                Pre-Anesthesia Assessment:                           - Prior to the procedure, a History and Physical                            was performed, and patient medications and                            allergies were reviewed. The patient's tolerance of                            previous anesthesia was also reviewed. The risks                            and benefits of the procedure and the sedation                            options and risks were discussed with the patient.                            All questions were answered, and informed consent                            was obtained. Prior Anticoagulants: The patient has                            taken Eliquis (apixaban), last dose was 3 days                            prior to procedure. ASA Grade Assessment: III - A  patient with severe systemic disease. After                            reviewing the risks and benefits, the patient was                            deemed in satisfactory condition to undergo the                            procedure.                           After obtaining informed consent, the colonoscope                            was passed under direct vision. Throughout the                             procedure, the patient's blood pressure, pulse, and                            oxygen saturations were monitored continuously. The                            CF-HQ190L ([id number]) Olympus colonoscope was                            introduced through the anus and advanced to the                            terminal ileum. The colonoscopy was performed                            without difficulty. The patient tolerated the                            procedure well. The quality of the bowel                            preparation was adequate to identify polyps greater                            than 5 mm in size. The terminal ileum, ileocecal                            valve, appendiceal orifice, and rectum were                            photographed. Scope In: 8:36:50 AM Scope Out: 9:10:27 AM Scope Withdrawal Time: 0 hours 26 minutes 18 seconds  Total Procedure Duration: 0 hours 33 minutes 37 seconds  Findings:      The perianal and digital rectal examinations were normal. Pertinent       negatives include normal sphincter tone and no palpable rectal lesions.      Multiple  small-mouthed diverticula were found in the sigmoid colon.      Five sessile polyps were found in the rectum (1), sigmoid colon (1),       descending colon (1) and transverse colon (2). The polyps were 4 to 7 mm       in size. These polyps were removed with a cold snare. Resection and       retrieval were complete.      The terminal ileum appeared normal.      The retroflexed view of the distal rectum and anal verge was normal and       showed no anal or rectal abnormalities. Impression:               - Diverticulosis in the sigmoid colon.                           - Five 4 to 7 mm polyps in the rectum, in the                            sigmoid colon, in the descending colon and in the                            transverse colon, removed with a cold snare.                            Resected  and retrieved.                           - The examined portion of the ileum was normal. Moderate Sedation:      Not Applicable - Patient had care per Anesthesia. Recommendation:           - Discharge patient to home (ambulatory).                           - Await pathology results.                           - Resume Eliquis (apixaban) at prior dose 09/19/2023                           - Repeat colonoscopy for surveillance based on                            pathology results.                           - The findings and recommendations were discussed                            with the patient's family.                           - Return to primary care physician.                           - Patient has a contact number available for  emergencies. The signs and symptoms of potential                            delayed complications were discussed with the                            patient. Return to normal activities tomorrow.                            Written discharge instructions were provided to the                            patient. Procedure Code(s):        --- Professional ---                           [phone], Colonoscopy, flexible; with removal of                            tumor(s), polyp(s), or other lesion(s) by snare                            technique Diagnosis Code(s):        --- Professional ---                           D12.8, Benign neoplasm of rectum                           D12.5, Benign neoplasm of sigmoid colon                           D12.4, Benign neoplasm of descending colon                           D12.3, Benign neoplasm of transverse colon (hepatic                            flexure or splenic flexure)                           R19.5, Other fecal abnormalities                           K57.30, Diverticulosis of large intestine without                            perforation or abscess without bleeding CPT copyright 2022 American  Medical Association. All rights reserved. The codes documented in this report are preliminary and upon coder review may  be revised to meet current compliance requirements. [name], MD 09/17/2023 9:17:20 AM This report has been signed electronically. Number of Addenda: 0

## 2023-09-17 NOTE — Anesthesia Preprocedure Evaluation (Signed)
 Anesthesia Evaluation  Patient identified by MRN, date of birth, ID band Patient awake    Reviewed: Allergy & Precautions, NPO status , Patient's Chart, lab work & pertinent test results  Airway Mallampati: II  TM Distance: >3 FB Neck ROM: Full    Dental no notable dental hx.    Pulmonary neg pulmonary ROS, former smoker   Pulmonary exam normal        Cardiovascular + CAD, + Past MI and +CHF  + dysrhythmias Atrial Fibrillation  Rhythm:Irregular Rate:Normal     Neuro/Psych negative neurological ROS  negative psych ROS   GI/Hepatic Neg liver ROS,,,CRC screening   Endo/Other  negative endocrine ROS    Renal/GU   negative genitourinary   Musculoskeletal negative musculoskeletal ROS (+)    Abdominal Normal abdominal exam  (+)   Peds  Hematology negative hematology ROS (+)   Anesthesia Other Findings   Reproductive/Obstetrics                             Anesthesia Physical Anesthesia Plan  ASA: 3  Anesthesia Plan: MAC   Post-op Pain Management:    Induction: Intravenous  PONV Risk Score and Plan: 1 and Propofol infusion and Treatment may vary due to age or medical condition  Airway Management Planned: Simple Face Mask and Nasal Cannula  Additional Equipment: None  Intra-op Plan:   Post-operative Plan:   Informed Consent: I have reviewed the patients History and Physical, chart, labs and discussed the procedure including the risks, benefits and alternatives for the proposed anesthesia with the patient or authorized representative who has indicated his/her understanding and acceptance.     Dental advisory given  Plan Discussed with: CRNA  Anesthesia Plan Comments:        Anesthesia Quick Evaluation

## 2023-09-18 ENCOUNTER — Encounter (HOSPITAL_COMMUNITY): Payer: Self-pay | Admitting: Pediatrics

## 2023-09-18 ENCOUNTER — Encounter: Payer: Self-pay | Admitting: Pediatrics

## 2023-09-18 LAB — SURGICAL PATHOLOGY

## 2023-09-27 ENCOUNTER — Encounter: Payer: Self-pay | Admitting: Internal Medicine

## 2023-10-22 NOTE — Addendum Note (Signed)
 Addended by: Lott Rouleau A on: 10/22/2023 07:59 AM   Modules accepted: Orders

## 2023-10-22 NOTE — Progress Notes (Signed)
 Remote ICD transmission.

## 2023-11-01 ENCOUNTER — Ambulatory Visit: Payer: PPO | Attending: Cardiology | Admitting: Cardiology

## 2023-11-01 ENCOUNTER — Encounter: Payer: Self-pay | Admitting: Cardiology

## 2023-11-01 VITALS — BP 120/60 | HR 59 | Ht 74.0 in | Wt 207.0 lb

## 2023-11-01 DIAGNOSIS — E785 Hyperlipidemia, unspecified: Secondary | ICD-10-CM

## 2023-11-01 DIAGNOSIS — I5042 Chronic combined systolic (congestive) and diastolic (congestive) heart failure: Secondary | ICD-10-CM | POA: Diagnosis not present

## 2023-11-01 DIAGNOSIS — I2510 Atherosclerotic heart disease of native coronary artery without angina pectoris: Secondary | ICD-10-CM

## 2023-11-01 DIAGNOSIS — I10 Essential (primary) hypertension: Secondary | ICD-10-CM | POA: Diagnosis not present

## 2023-11-01 DIAGNOSIS — I480 Paroxysmal atrial fibrillation: Secondary | ICD-10-CM

## 2023-11-01 LAB — CBC

## 2023-11-01 NOTE — Patient Instructions (Signed)
 Medication Instructions:  No Changes *If you need a refill on your cardiac medications before your next appointment, please call your pharmacy*  Lab Work: Today: CBC, BMET, Magnesium  If you have labs (blood work) drawn today and your tests are completely normal, you will receive your results only by: MyChart Message (if you have MyChart) OR A paper copy in the mail If you have any lab test that is abnormal or we need to change your treatment, we will call you to review the results.   Follow-Up: At [facility], you and your health needs are our priority.  As part of our continuing mission to provide you with exceptional heart care, our providers are all part of one team.  This team includes your primary Cardiologist (physician) and Advanced Practice Providers or APPs (Physician Assistants and Nurse Practitioners) who all work together to provide you with the care you need, when you need it.  Your next appointment:   6 month(s)  Provider:   Wendie Hamburg, MD   Other Instructions Please call us  or send a MyChart message with any Cardiology related questions/concerns.  [phone].  Thank you!

## 2023-11-01 NOTE — Progress Notes (Signed)
 Cardiology Office Note:    Date:  11/01/2023   ID:  Mike Cole, DOB 01/26/1951, MRN [id number]  PCP:  Stanley Earls, PA  Cardiologist:  Wendie Hamburg, MD  Electrophysiologist:  Richardo Chandler, MD   Referring MD: Annabell Key, [place]  E, PA   Chief Complaint  Patient presents with   Congestive Heart Failure    History of Present Illness:    Mike Cole is a 73 y.o. male with a hx of CAD status post anterior MI with BMS to LAD in 2010 complicated by cardiac arrest and ischemic cardiomyopathy status post ICD, hypertension, hyperlipidemia, TIA, atrial fibrillation who presents for follow-up.  Previously followed with Dr. Jacquelynn Matter.  Most recent echocardiogram 06/2023 showed EF 25 to 30%, normal RV function, moderate tricuspid regurgitation.  Since last clinic visit, he reports he is doing well.  Does report report has occasional dyspnea. Walks 2-3 times per week for 30-45 minutes.  Denies any dyspnea during his walks. Denies any chest pain, lower extremity edema, or palpitations.  Having some lightheadedness with denies any syncope.  He is on Eliquis , denies any bleeding.   Past Medical History:  Diagnosis Date   Acute anterior myocardial infarction (HCC)    BMS to the LAD   Atrial fibrillation (HCC)    Cardiogenic shock (HCC)    resolved   CHF (congestive heart failure) (HCC)    Coronary artery disease    Dyslipidemia    History of transient ischemic attack    Ischemic cardiomyopathy    EF of 20-25%   Kidney stones    Ventricular tachycardia (HCC)    arrest, resuscitated    Past Surgical History:  Procedure Laterality Date   CATARACT EXTRACTION, BILATERAL  2024   COLONOSCOPY WITH PROPOFOL  N/A 09/17/2023   Procedure: COLONOSCOPY WITH PROPOFOL ;  Surgeon: Truddie Furrow, MD;  Location: [facility] ENDOSCOPY;  Service: Gastroenterology;  Laterality: N/A;   EP IMPLANTABLE DEVICE N/A 07/07/2016   Procedure: ICD Generator Changeout;  Surgeon: Verona Goodwill, MD;  Location:  [facility] INVASIVE CV LAB;  Service: Cardiovascular;  Laterality: N/A;   POLYPECTOMY  09/17/2023   Procedure: POLYPECTOMY, INTESTINE;  Surgeon: Truddie Furrow, MD;  Location: [facility] ENDOSCOPY;  Service: Gastroenterology;;   [employer] CURRENT + DR  10/30/2008    Current Medications: Current Meds  Medication Sig   apixaban  (ELIQUIS ) 5 MG TABS tablet Take 1 tablet (5 mg total) by mouth 2 (two) times daily.   atorvastatin  (LIPITOR) 40 MG tablet Take 1 tablet (40 mg total) by mouth daily.   carvedilol  (COREG ) 3.125 MG tablet Take 1 tablet (3.125 mg total) by mouth 2 (two) times daily with a meal.   empagliflozin  (JARDIANCE ) 10 MG TABS tablet Take 1 tablet (10 mg total) by mouth daily before breakfast.   Lecith-Inosi-Chol-B12-Liver (LIVERITE) 1.125 MCG TABS Take 1.125 mcg by mouth 2 (two) times daily.   NON FORMULARY Relief Factor take 4 capsules bid   sacubitril-valsartan (ENTRESTO ) 97-103 MG Take one tablet by mouth twice a day. [facility] Billing Information: [id number], [id number], Group: [id number], [id number]   spironolactone  (ALDACTONE ) 25 MG tablet Take 0.5 tablets (12.5 mg total) by mouth daily.     Allergies:   Patient has no known allergies.   Social History   Socioeconomic History   Marital status: Married    Spouse name: Not on file   Number of children: 3   Years of education: Not on file  Highest education level: Not on file  Occupational History   Occupation: retired  Tobacco Use   Smoking status: Former    Current packs/day: 0.00    Types: Cigarettes    Quit date: 06/05/2008    Years since quitting: 15.4   Smokeless tobacco: Never  Vaping Use   Vaping status: Never Used  Substance and Sexual Activity   Alcohol use: Not Currently    Comment: occ   Drug use: No   Sexual activity: Not on file  Other Topics Concern   Not on file  Social History Narrative   Not on file   Social Drivers of Health   Financial  Resource Strain: Not on file  Food Insecurity: Not on file  Transportation Needs: Not on file  Physical Activity: Not on file  Stress: Not on file  Social Connections: Not on file     Family History: The patient's family history includes CAD in his brother, brother, father, mother, and sister; Heart attack in his brother; Heart disease in his brother; Lung cancer in his maternal aunt and maternal uncle.  ROS:   Please see the history of present illness.     All other systems reviewed and are negative.  EKGs/Labs/Other Studies Reviewed:    The following studies were reviewed today:   EKG:   11/01/23: Sinus bradycardia, rate 59, low voltage, anterior Q waves  Recent Labs: 11/01/2022: B Natriuretic Peptide 125.8 06/26/2023: BUN 12; Creatinine, Ser 1.28; Potassium 4.5; Sodium 141  Recent Lipid Panel    Component Value Date/Time   CHOL 103 05/10/2020 0923   Mike 54 05/10/2020 0923   HDL 47 05/10/2020 0923   CHOLHDL 2.2 05/10/2020 0923   CHOLHDL 2.4 02/14/2016 0922   VLDL 13 02/14/2016 0922   LDLCALC 43 05/10/2020 0923    Physical Exam:    VS:  BP 120/60   Pulse (!) 59   Ht 6\' 2"  (1.88 m)   Wt 207 lb (93.9 kg)   SpO2 96%   BMI 26.58 kg/m     Wt Readings from Last 3 Encounters:  11/01/23 207 lb (93.9 kg)  09/17/23 203 lb 0.7 oz (92.1 kg)  07/25/23 208 lb 4 oz (94.5 kg)     GEN:  Well nourished, well developed in no acute distress HEENT: Normal NECK: No JVD; No carotid bruits CARDIAC: RRR, no murmurs, rubs, gallops RESPIRATORY:  Clear to auscultation without rales, wheezing or rhonchi  ABDOMEN: Soft, non-tender, non-distended MUSCULOSKELETAL:  No edema; No deformity  SKIN: Warm and dry NEUROLOGIC:  Alert and oriented x 3 PSYCHIATRIC:  Normal affect   ASSESSMENT:    1. Chronic combined systolic and diastolic heart failure (HCC)   2. Coronary artery disease involving native coronary artery of native heart without angina pectoris   3. Essential hypertension    4. PAF (paroxysmal atrial fibrillation) (HCC)   5. Hyperlipidemia, unspecified hyperlipidemia type    PLAN:    Chronic combined heart failure: Ischemic cardiomyopathy.  Anterior MI in 2010 complicated by cardiac arrest.  Echocardiogram 06/2023 showed EF 25 to 30%, normal RV function, moderate tricuspid regurgitation.  Status post AICD.  Appears euvolemic on exam -Continue Entresto  97-103 mg twice daily -Continue carvedilol  3.125 mg twice daily -Continue Jardiance  10 mg daily -Continue spironolactone  12.5 mg daily -Check BMET, magnesium -ICD management per EP  CAD: Anterior MI with BMS to LAD in 2010 which was complicated by cardiac arrest.  - Continue Eliquis  - Continue atorvastatin  40 mg daily  Paroxysmal atrial  fibrillation: Admitted 10/2022 with A-fib with RVR, converted to sinus rhythm with IV amiodarone .  CHA2DS2-VASc 6 (CHF, hypertension, age, TIA, CAD).  No A-fib seen on device interrogation - Continue Eliquis  5 mg twice daily.  Check CBC - Continue carvedilol  3.125 mg twice daily  Hyperlipidemia: On atorvastatin  40 mg daily.  LDL 46 on 04/04/2023  Hypertension: On Entresto , carvedilol , spironolactone .  Appears controlled  RTC in 6 months   Medication Adjustments/Labs and Tests Ordered: Current medicines are reviewed at length with the patient today.  Concerns regarding medicines are outlined above.  Orders Placed This Encounter  Procedures   CBC   Basic metabolic panel with GFR   Magnesium   EKG 12-Lead   No orders of the defined types were placed in this encounter.   Patient Instructions  Medication Instructions:  No Changes *If you need a refill on your cardiac medications before your next appointment, please call your pharmacy*  Lab Work: Today: CBC, BMET, Magnesium  If you have labs (blood work) drawn today and your tests are completely normal, you will receive your results only by: MyChart Message (if you have MyChart) OR A paper copy in the mail If you  have any lab test that is abnormal or we need to change your treatment, we will call you to review the results.   Follow-Up: At [facility], you and your health needs are our priority.  As part of our continuing mission to provide you with exceptional heart care, our providers are all part of one team.  This team includes your primary Cardiologist (physician) and Advanced Practice Providers or APPs (Physician Assistants and Nurse Practitioners) who all work together to provide you with the care you need, when you need it.  Your next appointment:   6 month(s)  Provider:   Wendie Hamburg, MD   Other Instructions Please call us  or send a MyChart message with any Cardiology related questions/concerns.  [phone].  Thank you!        Signed, Wendie Hamburg, MD  11/01/2023 8:41 AM    [facility]

## 2023-11-02 ENCOUNTER — Ambulatory Visit: Payer: Self-pay | Admitting: Cardiology

## 2023-11-02 LAB — BASIC METABOLIC PANEL WITH GFR
BUN/Creatinine Ratio: 12 (ref 10–24)
BUN: 14 mg/dL (ref 8–27)
CO2: 21 mmol/L (ref 20–29)
Calcium: 9.3 mg/dL (ref 8.6–10.2)
Chloride: 103 mmol/L (ref 96–106)
Creatinine, Ser: 1.21 mg/dL (ref 0.76–1.27)
Glucose: 79 mg/dL (ref 70–99)
Potassium: 5 mmol/L (ref 3.5–5.2)
Sodium: 139 mmol/L (ref 134–144)
eGFR: 64 mL/min/{1.73_m2} (ref >59)

## 2023-11-02 LAB — CBC
Hematocrit: 45.3 % (ref 37.5–51.0)
Hemoglobin: 15.1 g/dL (ref 13.0–17.7)
MCH: 32.7 pg (ref 26.6–33.0)
MCHC: 33.3 g/dL (ref 31.5–35.7)
MCV: 98 fL — ABNORMAL HIGH (ref 79–97)
Platelets: 173 10*3/uL (ref 150–450)
RBC: 4.62 x10E6/uL (ref 4.14–5.80)
RDW: 13.1 % (ref 11.6–15.4)
WBC: 4 10*3/uL (ref 3.4–10.8)

## 2023-11-02 LAB — MAGNESIUM: Magnesium: 2.2 mg/dL (ref 1.6–2.3)

## 2023-11-16 ENCOUNTER — Encounter: Payer: Self-pay | Admitting: Cardiology

## 2023-11-16 ENCOUNTER — Other Ambulatory Visit: Payer: Self-pay | Admitting: *Deleted

## 2023-11-16 DIAGNOSIS — I10 Essential (primary) hypertension: Secondary | ICD-10-CM

## 2023-11-16 NOTE — Progress Notes (Signed)
 Called and made patient aware to stop Jardiance  per Dr. Alda Amas and have BMET in one week.

## 2023-11-23 ENCOUNTER — Other Ambulatory Visit: Payer: Self-pay

## 2023-11-23 DIAGNOSIS — I10 Essential (primary) hypertension: Secondary | ICD-10-CM

## 2023-11-24 LAB — BASIC METABOLIC PANEL WITH GFR
BUN/Creatinine Ratio: 13 (ref 10–24)
BUN: 15 mg/dL (ref 8–27)
CO2: 22 mmol/L (ref 20–29)
Calcium: 8.9 mg/dL (ref 8.6–10.2)
Chloride: 103 mmol/L (ref 96–106)
Creatinine, Ser: 1.2 mg/dL (ref 0.76–1.27)
Glucose: 96 mg/dL (ref 70–99)
Potassium: 4.9 mmol/L (ref 3.5–5.2)
Sodium: 139 mmol/L (ref 134–144)
eGFR: 64 mL/min/{1.73_m2} (ref >59)

## 2023-11-26 ENCOUNTER — Ambulatory Visit: Payer: Self-pay | Admitting: Cardiology

## 2023-11-28 ENCOUNTER — Other Ambulatory Visit: Payer: Self-pay | Admitting: Internal Medicine

## 2023-11-28 DIAGNOSIS — I480 Paroxysmal atrial fibrillation: Secondary | ICD-10-CM

## 2023-11-28 NOTE — Telephone Encounter (Signed)
 Prescription refill request for Eliquis  received. Indication: Afib  Last office visit: 11/01/23 [place])  Scr: 1.20 (11/23/23)  Age: 73 Weight: 93.9kg  Appropriate dose. Refill sent.

## 2023-12-14 ENCOUNTER — Ambulatory Visit: Payer: PPO

## 2023-12-14 DIAGNOSIS — I255 Ischemic cardiomyopathy: Secondary | ICD-10-CM

## 2023-12-15 LAB — CUP PACEART REMOTE DEVICE CHECK
Battery Remaining Longevity: 38 mo
Battery Remaining Percentage: 37 %
Battery Voltage: 2.89 V
Brady Statistic RV Percent Paced: 1 %
Date Time Interrogation Session: 20250711020017
HighPow Impedance: 64 Ohm
HighPow Impedance: 64 Ohm
Lead Channel Impedance Value: 310 Ohm
Lead Channel Pacing Threshold Amplitude: 1.25 V
Lead Channel Pacing Threshold Pulse Width: 0.6 ms
Lead Channel Sensing Intrinsic Amplitude: 11.7 mV
Lead Channel Setting Pacing Amplitude: 2.5 V
Lead Channel Setting Pacing Pulse Width: 0.6 ms
Lead Channel Setting Sensing Sensitivity: 0.5 mV
Pulse Gen Serial Number: 7402114

## 2023-12-23 ENCOUNTER — Ambulatory Visit: Payer: Self-pay | Admitting: Cardiology

## 2024-02-07 ENCOUNTER — Other Ambulatory Visit: Payer: Self-pay | Admitting: Internal Medicine

## 2024-02-12 ENCOUNTER — Ambulatory Visit: Attending: Cardiology | Admitting: Cardiology

## 2024-02-12 ENCOUNTER — Encounter: Payer: Self-pay | Admitting: Cardiology

## 2024-02-12 VITALS — BP 130/62 | HR 62 | Ht 74.0 in | Wt 209.6 lb

## 2024-02-12 DIAGNOSIS — D6869 Other thrombophilia: Secondary | ICD-10-CM

## 2024-02-12 DIAGNOSIS — I480 Paroxysmal atrial fibrillation: Secondary | ICD-10-CM

## 2024-02-12 DIAGNOSIS — I5022 Chronic systolic (congestive) heart failure: Secondary | ICD-10-CM | POA: Diagnosis not present

## 2024-02-12 DIAGNOSIS — I255 Ischemic cardiomyopathy: Secondary | ICD-10-CM

## 2024-02-12 DIAGNOSIS — Z95810 Presence of automatic (implantable) cardiac defibrillator: Secondary | ICD-10-CM

## 2024-02-12 DIAGNOSIS — T82110D Breakdown (mechanical) of cardiac electrode, subsequent encounter: Secondary | ICD-10-CM

## 2024-02-12 DIAGNOSIS — I2510 Atherosclerotic heart disease of native coronary artery without angina pectoris: Secondary | ICD-10-CM

## 2024-02-12 LAB — CUP PACEART INCLINIC DEVICE CHECK
Battery Remaining Longevity: 38 mo
Brady Statistic RA Percent Paced: 0 %
Brady Statistic RV Percent Paced: 0 %
Date Time Interrogation Session: 20250909154418
HighPow Impedance: 72 Ohm
Implantable Lead Connection Status: 753985
Implantable Lead Connection Status: 753985
Implantable Lead Implant Date: 20100527
Implantable Lead Implant Date: 20100527
Implantable Lead Location: 753859
Implantable Lead Location: 753860
Implantable Pulse Generator Implant Date: 20180202
Lead Channel Impedance Value: 362.5 Ohm
Lead Channel Impedance Value: 437.5 Ohm
Lead Channel Pacing Threshold Amplitude: 1.25 V
Lead Channel Pacing Threshold Amplitude: 1.25 V
Lead Channel Pacing Threshold Pulse Width: 0.6 ms
Lead Channel Pacing Threshold Pulse Width: 0.6 ms
Lead Channel Sensing Intrinsic Amplitude: 0.2 mV
Lead Channel Sensing Intrinsic Amplitude: 11.7 mV
Lead Channel Setting Pacing Amplitude: 2.5 V
Lead Channel Setting Pacing Pulse Width: 0.6 ms
Lead Channel Setting Sensing Sensitivity: 0.5 mV
Pulse Gen Serial Number: 7402114

## 2024-02-12 NOTE — Progress Notes (Unsigned)
 Electrophysiology Office Note:   Date:  02/12/2024  ID:  Mike Cole, DOB [birth year], MRN [id number]  Primary Cardiologist: Lonni LITTIE Nanas, MD Electrophysiologist: Fonda Kitty, MD  {Click to update primary MD,subspecialty MD or APP then REFRESH:1}    History of Present Illness:   Mike Cole is a 73 y.o. male with h/o *** seen today for ***  Discussed the use of AI scribe software for clinical note transcription with the patient, who gave verbal consent to proceed.  History of Present Illness Mike Cole is a 73 year old male with atrial fibrillation and a pacemaker who presents for a follow-up visit.  He has a history of atrial fibrillation and a pacemaker implantation. The atrial lead of his pacemaker is dislodged and not currently functional. Initially, there was a plan to replace the lead, but the decision was made not to proceed.  He has been on Eliquis  for over a year due to atrial fibrillation. He questions the necessity of continuing this medication, noting only one episode of atrial fibrillation which he believes was self-induced. No significant bleeding issues are reported, although he experienced prolonged bleeding after a dental procedure. No blood in the stool or urine, and no other significant bleeding events.  He is on carvedilol  to manage his heart rate. Occasionally, he feels 'a little funky' and notes a heart rate of 49 bpm recently. His resting heart rate is typically in the fifties, and he attributes the low reading to extra heartbeats not counted by his pulse oximeter.  He has never experienced a shock from his ICD and reports no episodes of rapid heartbeats in the lower chambers.    Review of systems complete and found to be negative unless listed in HPI.   EP Information / Studies Reviewed:    {EKGtoday:28818}      ***  Risk Assessment/Calculations:   {Does this patient have ATRIAL FIBRILLATION?[phone]} No BP recorded.  {Refresh  Note OR Click here to enter BP  :1}***        Physical Exam:   VS:  There were no vitals taken for this visit.   Wt Readings from Last 3 Encounters:  11/01/23 207 lb (93.9 kg)  09/17/23 203 lb 0.7 oz (92.1 kg)  07/25/23 208 lb 4 oz (94.5 kg)     GEN: Well nourished, well developed in no acute distress NECK: No JVD CARDIAC: {EPRHYTHM:28826}, no murmurs, rubs, gallops RESPIRATORY:  Clear to auscultation without rales, wheezing or rhonchi  ABDOMEN: Soft, non-distended EXTREMITIES:  No edema; No deformity   ASSESSMENT AND PLAN:   Assessment and Plan Assessment & Plan Atrial lead failure (dislodged atrial lead) in patient with ICD The atrial lead is dislodged and not in contact with the heart tissue, rendering it non-functional. The ICD is functioning well otherwise, with no recent shocks or episodes of rapid ventricular tachycardia. The dislodged lead limits the ability to monitor for atrial fibrillation.  History of atrial fibrillation with anticoagulation management Atrial fibrillation is managed with Eliquis , reducing stroke risk by approximately two-thirds from a baseline of 10%. He has experienced prolonged bleeding after dental procedures but no life-threatening bleeding events. Due to the dislodged atrial lead, continuous monitoring for atrial fibrillation is not possible with the current ICD setup. - Provide information on the loop recorder and Watchman device for consideration as alternatives to Eliquis . - Advise to report any significant bleeding or side effects from Eliquis .  Chronic systolic heart failure and ischemic cardiomyopathy Chronic systolic heart failure and  ischemic cardiomyopathy are managed with current medications, including carvedilol . He reports occasional low heart rates, likely due to ectopic beats, but is asymptomatic with no dizziness or lightheadedness. - Continue current medication regimen, including carvedilol . - Advise to report any symptoms of dizziness  or lightheadedness, which may necessitate stopping carvedilol .      Follow up with {EPMDS:28135::EP Team} {EPFOLLOW LE:71826}  Signed, Fonda Kitty, MD

## 2024-02-12 NOTE — Patient Instructions (Signed)

## 2024-02-24 ENCOUNTER — Ambulatory Visit: Payer: Self-pay | Admitting: Cardiology

## 2024-03-13 NOTE — Progress Notes (Signed)
 Remote ICD Transmission

## 2024-03-14 ENCOUNTER — Ambulatory Visit: Payer: PPO

## 2024-03-14 DIAGNOSIS — I255 Ischemic cardiomyopathy: Secondary | ICD-10-CM | POA: Diagnosis not present

## 2024-03-14 LAB — CUP PACEART REMOTE DEVICE CHECK
Battery Remaining Longevity: 35 mo
Battery Remaining Percentage: 35 %
Battery Voltage: 2.89 V
Brady Statistic RV Percent Paced: 0 %
Date Time Interrogation Session: 20251010020015
HighPow Impedance: 63 Ohm
HighPow Impedance: 63 Ohm
Lead Channel Impedance Value: 330 Ohm
Lead Channel Pacing Threshold Amplitude: 1.25 V
Lead Channel Pacing Threshold Pulse Width: 0.6 ms
Lead Channel Sensing Intrinsic Amplitude: 11.7 mV
Lead Channel Setting Pacing Amplitude: 2.5 V
Lead Channel Setting Pacing Pulse Width: 0.6 ms
Lead Channel Setting Sensing Sensitivity: 0.5 mV
Pulse Gen Serial Number: 7402114

## 2024-03-17 NOTE — Progress Notes (Signed)
 Remote ICD Transmission

## 2024-03-19 ENCOUNTER — Ambulatory Visit: Payer: Self-pay | Admitting: Cardiology

## 2024-05-12 ENCOUNTER — Other Ambulatory Visit: Payer: Self-pay

## 2024-05-15 MED ORDER — SACUBITRIL-VALSARTAN 97-103 MG PO TABS: ORAL_TABLET | ORAL | 0 refills | Status: DC

## 2024-05-22 ENCOUNTER — Other Ambulatory Visit: Payer: Self-pay | Admitting: Cardiology

## 2024-05-22 DIAGNOSIS — I480 Paroxysmal atrial fibrillation: Secondary | ICD-10-CM

## 2024-05-22 NOTE — Telephone Encounter (Signed)
 Refill request

## 2024-05-22 NOTE — Telephone Encounter (Signed)
 Prescription refill request for Eliquis  received. Indication: AF Last office visit: 02/12/24  JINNY Kitty MD Scr: 1.20 on 11/23/23  Epic Age: 73 Weight: 95.1kg  Based on above findings Eliquis  5mg  twice daily is the appropriate dose.  Refill approved.

## 2024-06-08 NOTE — Progress Notes (Unsigned)
 " [facility] [phone]   [phone]  INITIAL CONSULT NOTE  Patient Care Team: Cleotilde, Virginia  E, PA as PCP - General (Internal Medicine) Kate Lonni CROME, MD as PCP - Cardiology (Cardiology) Kennyth Chew, MD as PCP - Electrophysiology (Cardiology) Swinyer, Rosaline HERO, NP as Nurse Practitioner (Cardiology)  Hematological/Oncological History # Thrombocytopenia  04/09/2024: WBC 3.8, Hgb 13.9, MCV 96, Plt 135  04/28/2024: WBC 3.5, Hgb 13.9, MCV 95, Plt 130  06/09/2024: establish care with Dr. Federico   CHIEF COMPLAINTS/PURPOSE OF CONSULTATION:  Thrombocytopenia    HISTORY OF PRESENTING ILLNESS:  Mike Cole 74 y.o. male with medical history significant for MI, atrial fibrillation, CHF, hyperlipidemia, and kidney stones who presents for evaluation of thrombocytopenia.  On review of the previous records Mike Cole had labs drawn on 04/09/2024 which showed a white blood cell count of 3.8 with platelets of 135.  These were repeated on 04/28/2024 with a white blood cell count 3.5 and a platelet of 130.  Due to concern for this thrombocytopenia the patient was referred to hematology for further evaluation and management.  On exam today Mike Cole is accompanied by his wife.  They report that he has had no issues with bleeding, bruising, or dark stools.  He reports that he is taking his Eliquis  5 mg twice daily with no major difficulty.  He is not vegetarian or vegan.  He does enjoy eating red meat approximately 1-2 times per week.  He reports he has no known history of liver disease.  He is currently taking Jardiance  and did have a fungal infection recently but no recent viral illnesses such as runny nose, sore throat, cough or fevers, chills, sweats.  He has no known history of autoimmune disease.  On further discussion he reports his mother had heart disease and his father died of suicide.  He had an uncle with lung cancer.  He has 2 biological  children both of whom are healthy.  He reports that he has 4 grandchildren and 1 great grandchild.  He reports that he is a former smoker having quit in 2010 after smoking a little less than 1 pack/day.  He very rarely drinks alcohol and previously worked in glass blower/designer.  Otherwise he has been at his baseline level of health and has no additional questions concerns or complaints today.  Full 10 point ROS is otherwise negative.  MEDICAL HISTORY:  Past Medical History:  Diagnosis Date   Acute anterior myocardial infarction (HCC)    BMS to the LAD   Atrial fibrillation (HCC)    Cardiogenic shock (HCC)    resolved   CHF (congestive heart failure) (HCC)    Coronary artery disease    Dyslipidemia    History of transient ischemic attack    Ischemic cardiomyopathy    EF of 20-25%   Kidney stones    Ventricular tachycardia (HCC)    arrest, resuscitated    SURGICAL HISTORY: Past Surgical History:  Procedure Laterality Date   CATARACT EXTRACTION, BILATERAL  2024   COLONOSCOPY WITH PROPOFOL  N/A 09/17/2023   Procedure: COLONOSCOPY WITH PROPOFOL ;  Surgeon: Suzann Inocente HERO, MD;  Location: [facility];  Service: Gastroenterology;  Laterality: N/A;   EP IMPLANTABLE DEVICE N/A 07/07/2016   Procedure: ICD Generator Changeout;  Surgeon: Elspeth JAYSON Sage, MD;  Location: [facility] INVASIVE CV LAB;  Service: Cardiovascular;  Laterality: N/A;   POLYPECTOMY  09/17/2023   Procedure: POLYPECTOMY, INTESTINE;  Surgeon: Suzann Inocente HERO, MD;  Location: [facility];  Service: Gastroenterology;;   St Jude CURRENT + DR  10/30/2008    SOCIAL HISTORY: Social History   Socioeconomic History   Marital status: Married    Spouse name: Not on file   Number of children: 3   Years of education: Not on file   Highest education level: Not on file  Occupational History   Occupation: retired  Tobacco Use   Smoking status: Former    Current packs/day: 0.00    Types: Cigarettes    Quit date: 06/05/2008    Years  since quitting: 16.0   Smokeless tobacco: Never  Vaping Use   Vaping status: Never Used  Substance and Sexual Activity   Alcohol use: Not Currently    Comment: occ   Drug use: No   Sexual activity: Not on file  Other Topics Concern   Not on file  Social History Narrative   Not on file   Social Drivers of Health   Tobacco Use: Medium Risk (02/12/2024)   Patient History    Smoking Tobacco Use: Former    Smokeless Tobacco Use: Never    Passive Exposure: Not on Actuary Strain: Not on file  Food Insecurity: Patient Declined (06/07/2024)   Epic    Worried About Programme Researcher, Broadcasting/film/video in the Last Year: Patient declined    Barista in the Last Year: Patient declined  Transportation Needs: Patient Declined (06/07/2024)   Epic    Lack of Transportation (Medical): Patient declined    Lack of Transportation (Non-Medical): Patient declined  Physical Activity: Not on file  Stress: Not on file  Social Connections: Not on file  Intimate Partner Violence: Not on file  Depression (PHQ2-9): Low Risk (06/09/2024)   Depression (PHQ2-9)    PHQ-2 Score: 0  Alcohol Screen: Not on file  Housing: Patient Declined (06/07/2024)   Epic    Unable to Pay for Housing in the Last Year: Patient declined    Number of Times Moved in the Last Year: Not on file    Homeless in the Last Year: Patient declined  Utilities: Patient Declined (06/07/2024)   Epic    Threatened with loss of utilities: Patient declined  Health Literacy: Not on file    FAMILY HISTORY: Family History  Problem Relation Age of Onset   CAD Mother    CAD Father    CAD Sister    CAD Brother    Heart disease Brother        30's, quadruple bypass   CAD Brother    Heart attack Brother    Lung cancer Maternal Uncle    Lung cancer Maternal Aunt     ALLERGIES:  has no known allergies.  MEDICATIONS:  Current Outpatient Medications  Medication Sig Dispense Refill   atorvastatin  (LIPITOR) 40 MG tablet Take 1 tablet (40  mg total) by mouth daily. 90 tablet 3   carvedilol  (COREG ) 3.125 MG tablet Take 1 tablet (3.125 mg total) by mouth 2 (two) times daily with a meal. 180 tablet 3   ELIQUIS  5 MG TABS tablet TAKE 1 TABLET BY MOUTH TWICE A DAY 60 tablet 5   Lecith-Inosi-Chol-B12-Liver (LIVERITE) 1.125 MCG TABS Take 1.125 mcg by mouth 2 (two) times daily.     NON FORMULARY Relief Factor take 4 capsules bid     sacubitril -valsartan  (ENTRESTO ) 97-103 MG Take one tablet by mouth twice a day. [facility] Bet[facility] Ferretti Billing Information: [id number], ERW:[id number], Group:  [id number], PI:[id number] 60 tablet 0   spironolactone  (ALDACTONE ) 25 MG tablet Take 0.5 tablets (12.5 mg total) by mouth daily.     No current facility-administered medications for this visit.    REVIEW OF SYSTEMS:   Constitutional: ( - ) fevers, ( - )  chills , ( - ) night sweats Eyes: ( - ) blurriness of vision, ( - ) double vision, ( - ) watery eyes Ears, nose, mouth, throat, and face: ( - ) mucositis, ( - ) sore throat Respiratory: ( - ) cough, ( - ) dyspnea, ( - ) wheezes Cardiovascular: ( - ) palpitation, ( - ) chest discomfort, ( - ) lower extremity swelling Gastrointestinal:  ( - ) nausea, ( - ) heartburn, ( - ) change in bowel habits Skin: ( - ) abnormal skin rashes Lymphatics: ( - ) new lymphadenopathy, ( - ) easy bruising Neurological: ( - ) numbness, ( - ) tingling, ( - ) new weaknesses Behavioral/Psych: ( - ) mood change, ( - ) new changes  All other systems were reviewed with the patient and are negative.  PHYSICAL EXAMINATION:  Vitals:   06/09/24 1418  BP: 138/69  Pulse: (!) 51  Resp: 14  Temp: 98.4 F (36.9 C)  SpO2: 99%   Filed Weights   06/09/24 1418  Weight: 213 lb (96.6 kg)    GENERAL: well appearing middle-age Caucasian male in NAD  SKIN: skin color, texture, turgor are normal, no rashes or significant lesions EYES: conjunctiva are pink and non-injected, sclera clear LUNGS:  clear to auscultation and percussion with normal breathing effort HEART: regular rate & rhythm and no murmurs and no lower extremity edema Musculoskeletal: no cyanosis of digits and no clubbing  PSYCH: alert & oriented x 3, fluent speech NEURO: no focal motor/sensory deficits  LABORATORY DATA:  I have reviewed the data as listed    Latest Ref Rng & Units 06/09/2024    3:05 PM 11/01/2023    8:59 AM 11/01/2022    8:06 AM  CBC  WBC 4.0 - 10.5 K/uL 3.9  4.0  3.9   Hemoglobin 13.0 - 17.0 g/dL 85.9  84.8  85.1   Hematocrit 39.0 - 52.0 % 41.1  45.3  44.0   Platelets 150 - 400 K/uL 132  173  158        Latest Ref Rng & Units 06/09/2024    3:05 PM 11/23/2023   10:18 AM 11/01/2023    8:59 AM  CMP  Glucose 70 - 99 mg/dL 92  96  79   BUN 8 - 23 mg/dL 11  15  14    Creatinine 0.61 - 1.24 mg/dL 8.88  8.79  8.78   Sodium 135 - 145 mmol/L 143  139  139   Potassium 3.5 - 5.1 mmol/L 4.3  4.9  5.0   Chloride 98 - 111 mmol/L 107  103  103   CO2 22 - 32 mmol/L 25  22  21    Calcium  8.9 - 10.3 mg/dL 9.4  8.9  9.3   Total Protein 6.5 - 8.1 g/dL 7.5     Total Bilirubin 0.0 - 1.2 mg/dL 0.6     Alkaline Phos 38 - 126 U/L 102     AST 15 - 41 U/L 22     ALT 0 - 44 U/L 16        ASSESSMENT & PLAN Mike Cole 74 y.o. male with medical history significant for MI, atrial fibrillation, CHF, hyperlipidemia, and kidney  stones who presents for evaluation of thrombocytopenia.  After review of the labs, review of the records, and discussion with the patient the patients findings are most consistent with thrombocytopenia of unclear etiology, suspect liver disease.  Thrombocytopenia is a common condition with a broad differential. The possible etiologies of thrombocytopenia include liver disease, splenomegaly, infectious process, nutritional deficiency, consumption/autoimmune destruction, pseudothrombocytopenia, and bone marrow disorders. Cirrhosis and liver disease the most common causes of moderate  thrombocytopenia. Evaluation should include full hepatitis serologies (Hep B and C) as well as HIV. Imaging of the liver spleen should be performed with an abdominal US  ( if prior imaging is no readily available). Nutritional etiologies should be ruled out with Vitamin b12 and folate testing.  A peripheral blood smear can help determine if there is clumping leading to pseudothrombocytopenia. If no clear etiology can be found would need to consider immune thrombocytopenia (ITP) with consideration of bone marrow biopsy.   #Thrombocytopenia  --will order CBC, CMP, Vitamin b12 and folate.   --will order immature platelet fraction and platelet by citrate --viral serologies with Hepatitis B, Hepatitis C, and HIV  --evaluate for liver disease/splenomegaly with abdominal US . If evidence of liver disease will make referral to gastroenterology.   --RTC pending the results of the above studies.     Orders Placed This Encounter  Procedures   US  Abdomen Complete    Standing Status:   Future    Expected Date:   06/16/2024    Expiration Date:   06/09/2025    Reason for Exam (SYMPTOM  OR DIAGNOSIS REQUIRED):   thrombocytopenia, asses for splenomegaly or liver disease    Preferred imaging location?:   [facility]   CBC with Differential (Cancer Center Only)    Standing Status:   Future    Number of Occurrences:   1    Expiration Date:   06/09/2025   CMP (Cancer Center only)    Standing Status:   Future    Number of Occurrences:   1    Expiration Date:   06/09/2025   Immature Platelet Fraction    Standing Status:   Future    Number of Occurrences:   1    Expiration Date:   06/09/2025   Lactate dehydrogenase (LDH)    Standing Status:   Future    Number of Occurrences:   1    Expiration Date:   06/09/2025   WBC/PLT in Citrate   Retic Panel    Standing Status:   Future    Number of Occurrences:   1    Expiration Date:   06/09/2025   Vitamin B12    Standing Status:   Future    Number of Occurrences:    1    Expiration Date:   06/09/2025   Methylmalonic acid, serum    Standing Status:   Future    Number of Occurrences:   1    Expiration Date:   06/09/2025   Folate, Serum    Standing Status:   Future    Number of Occurrences:   1    Expiration Date:   06/09/2025   Multiple Myeloma Panel (SPEP&IFE w/QIG)    Standing Status:   Future    Number of Occurrences:   1    Expiration Date:   06/09/2025   Kappa/lambda light chains    Standing Status:   Future    Number of Occurrences:   1    Expiration Date:   06/09/2025  HIV antibody (with reflex)    Standing Status:   Future    Number of Occurrences:   1    Expiration Date:   06/09/2025   Hepatitis C antibody    Standing Status:   Future    Number of Occurrences:   1    Expiration Date:   06/09/2025   Hepatitis B surface antigen    Standing Status:   Future    Number of Occurrences:   1    Expiration Date:   06/09/2025   Hepatitis B surface antibody    Standing Status:   Future    Number of Occurrences:   1    Expiration Date:   06/09/2025   Hepatitis B core antibody, total    Standing Status:   Future    Number of Occurrences:   1    Expiration Date:   06/09/2025    All questions were answered. The patient knows to call the clinic with any problems, questions or concerns.  A total of more than 60 minutes were spent on this encounter with face-to-face time and non-face-to-face time, including preparing to see the patient, ordering tests and/or medications, counseling the patient and coordination of care as outlined above.   Norleen IVAR Kidney, MD Department of Hematology/Oncology [facility] Phone: [phone] Pager: [phone] Email: [email or web address]  06/09/2024 4:16 PM  "

## 2024-06-08 NOTE — Progress Notes (Unsigned)
 " Cardiology Office Note:    Date:  06/09/2024   ID:  Mike Cole, DOB 1951/05/27, MRN [id number]  PCP:  Cleotilde, [place]  E, PA  Cardiologist:  Lonni LITTIE Nanas, MD  Electrophysiologist:  Fonda Kitty, MD   Referring MD: Cleotilde, [place]  E, PA   Chief Complaint  Patient presents with   Congestive Heart Failure    History of Present Illness:    Mike Cole is a 74 y.o. male with a hx of CAD status post anterior MI with BMS to LAD in 2010 complicated by cardiac arrest and ischemic cardiomyopathy status post ICD, hypertension, hyperlipidemia, TIA, atrial fibrillation who presents for follow-up.  Previously followed with Dr. Dann.  Most recent echocardiogram 06/2023 showed EF 25 to 30%, normal RV function, moderate tricuspid regurgitation.  Since last clinic visit, he reports he is doing okay.  Developed genital infection and stopped Jardiance .  He does report some shortness of breath but denies any chest pain. Denies any lightheadedness, syncope, lower extremity edema, or palpitations. Taking eliquis , but denies any bleeding.   Past Medical History:  Diagnosis Date   Acute anterior myocardial infarction (HCC)    BMS to the LAD   Atrial fibrillation (HCC)    Cardiogenic shock (HCC)    resolved   CHF (congestive heart failure) (HCC)    Coronary artery disease    Dyslipidemia    History of transient ischemic attack    Ischemic cardiomyopathy    EF of 20-25%   Kidney stones    Ventricular tachycardia (HCC)    arrest, resuscitated    Past Surgical History:  Procedure Laterality Date   CATARACT EXTRACTION, BILATERAL  2024   COLONOSCOPY WITH PROPOFOL  N/A 09/17/2023   Procedure: COLONOSCOPY WITH PROPOFOL ;  Surgeon: Suzann Inocente [name], MD;  Location: [facility];  Service: Gastroenterology;  Laterality: N/A;   EP IMPLANTABLE DEVICE N/A 07/07/2016   Procedure: ICD Generator Changeout;  Surgeon: Elspeth JAYSON Sage, MD;  Location: [facility] INVASIVE CV LAB;  Service: Cardiovascular;   Laterality: N/A;   POLYPECTOMY  09/17/2023   Procedure: POLYPECTOMY, INTESTINE;  Surgeon: Suzann Inocente [name], MD;  Location: [facility];  Service: Gastroenterology;;   [employer] CURRENT + DR  10/30/2008    Current Medications: Current Meds  Medication Sig   atorvastatin  (LIPITOR) 40 MG tablet Take 1 tablet (40 mg total) by mouth daily.   carvedilol  (COREG ) 3.125 MG tablet Take 1 tablet (3.125 mg total) by mouth 2 (two) times daily with a meal.   ELIQUIS  5 MG TABS tablet TAKE 1 TABLET BY MOUTH TWICE A DAY   Lecith-Inosi-Chol-B12-Liver (LIVERITE) 1.125 MCG TABS Take 1.125 mcg by mouth 2 (two) times daily.   NON FORMULARY Relief Factor take 4 capsules bid   sacubitril -valsartan  (ENTRESTO ) 97-103 MG Take one tablet by mouth twice a day. [facility] Billing Information: APW:[id number], ERW:[id number], Group: [id number], PI:[id number]   spironolactone  (ALDACTONE ) 25 MG tablet Take 0.5 tablets (12.5 mg total) by mouth daily.     Allergies:   Patient has no known allergies.   Social History   Socioeconomic History   Marital status: Married    Spouse name: Not on file   Number of children: 3   Years of education: Not on file   Highest education level: Not on file  Occupational History   Occupation: retired  Tobacco Use   Smoking status: Former    Current packs/day: 0.00    Types: Cigarettes  Quit date: 06/05/2008    Years since quitting: 16.0   Smokeless tobacco: Never  Vaping Use   Vaping status: Never Used  Substance and Sexual Activity   Alcohol use: Not Currently    Comment: occ   Drug use: No   Sexual activity: Not on file  Other Topics Concern   Not on file  Social History Narrative   Not on file   Social Drivers of Health   Tobacco Use: Medium Risk (02/12/2024)   Patient History    Smoking Tobacco Use: Former    Smokeless Tobacco Use: Never    Passive Exposure: Not on Actuary Strain: Not on file  Food  Insecurity: Patient Declined (06/07/2024)   Epic    Worried About Programme Researcher, Broadcasting/film/video in the Last Year: Patient declined    Barista in the Last Year: Patient declined  Transportation Needs: Patient Declined (06/07/2024)   Epic    Lack of Transportation (Medical): Patient declined    Lack of Transportation (Non-Medical): Patient declined  Physical Activity: Not on file  Stress: Not on file  Social Connections: Not on file  Depression (PHQ2-9): Not on file  Alcohol Screen: Not on file  Housing: Patient Declined (06/07/2024)   Epic    Unable to Pay for Housing in the Last Year: Patient declined    Number of Times Moved in the Last Year: Not on file    Homeless in the Last Year: Patient declined  Utilities: Patient Declined (06/07/2024)   Epic    Threatened with loss of utilities: Patient declined  Health Literacy: Not on file     Family History: The patient's family history includes CAD in his brother, brother, father, mother, and sister; Heart attack in his brother; Heart disease in his brother; Lung cancer in his maternal aunt and maternal uncle.  ROS:   Please see the history of present illness.     All other systems reviewed and are negative.  EKGs/Labs/Other Studies Reviewed:    The following studies were reviewed today:   EKG:   11/01/23: Sinus bradycardia, rate 59, low voltage, anterior Q waves  Recent Labs: 11/01/2023: Hemoglobin 15.1; Magnesium 2.2; Platelets 173 11/23/2023: BUN 15; Creatinine, Ser 1.20; Potassium 4.9; Sodium 139  Recent Lipid Panel    Component Value Date/Time   CHOL 103 05/10/2020 0923   TRIG 54 05/10/2020 0923   HDL 47 05/10/2020 0923   CHOLHDL 2.2 05/10/2020 0923   CHOLHDL 2.4 02/14/2016 0922   VLDL 13 02/14/2016 0922   LDLCALC 43 05/10/2020 0923    Physical Exam:    VS:  BP (!) 124/54 (BP Location: Left Arm, Patient Position: Sitting, Cuff Size: Normal)   Pulse 64   Ht 6' 2 (1.88 m)   Wt 213 lb (96.6 kg)   SpO2 98%   BMI 27.35  kg/m     Wt Readings from Last 3 Encounters:  06/09/24 213 lb (96.6 kg)  02/12/24 209 lb 9.6 oz (95.1 kg)  11/01/23 207 lb (93.9 kg)     GEN:  Well nourished, well developed in no acute distress HEENT: Normal NECK: No JVD; No carotid bruits CARDIAC: RRR, no murmurs, rubs, gallops RESPIRATORY:  Clear to auscultation without rales, wheezing or rhonchi  ABDOMEN: Soft, non-tender, non-distended MUSCULOSKELETAL:  No edema; No deformity  SKIN: Warm and dry NEUROLOGIC:  Alert and oriented x 3 PSYCHIATRIC:  Normal affect   ASSESSMENT:    1. Chronic combined systolic and diastolic heart  failure (HCC)   2. Coronary artery disease involving native coronary artery of native heart without angina pectoris   3. PAF (paroxysmal atrial fibrillation) (HCC)   4. Essential hypertension   5. Hyperlipidemia, unspecified hyperlipidemia type     PLAN:    Chronic combined heart failure: Ischemic cardiomyopathy.  Anterior MI in 2010 complicated by cardiac arrest.  Echocardiogram 06/2023 showed EF 25 to 30%, normal RV function, moderate tricuspid regurgitation.  Status post AICD.  Appears euvolemic on exam -Continue Entresto  97-103 mg twice daily -Continue carvedilol  3.125 mg twice daily -Jardiance  discontinued to genital infection -Continue spironolactone  12.5 mg daily - Having labs drawn today, will follow-up results -Update echocardiogram -ICD management per EP  CAD: Anterior MI with BMS to LAD in 2010 which was complicated by cardiac arrest.  - Continue Eliquis  - Continue atorvastatin  40 mg daily  Paroxysmal atrial fibrillation: Admitted 10/2022 with A-fib with RVR, converted to sinus rhythm with IV amiodarone .  CHA2DS2-VASc 6 (CHF, hypertension, age, TIA, CAD).  No A-fib seen on device interrogation - Continue Eliquis  5 mg twice daily.   - Continue carvedilol  3.125 mg twice daily  Hyperlipidemia: On atorvastatin  40 mg daily.  LDL 48 on 04/2024  Hypertension: On Entresto , carvedilol ,  spironolactone .  Appears controlled    RTC in 6 months   Medication Adjustments/Labs and Tests Ordered: Current medicines are reviewed at length with the patient today.  Concerns regarding medicines are outlined above.  Orders Placed This Encounter  Procedures   ECHOCARDIOGRAM COMPLETE   No orders of the defined types were placed in this encounter.   Patient Instructions  Medication Instructions:  Your physician recommends that you continue on your current medications as directed. Please refer to the Current Medication list given to you today.  *If you need a refill on your cardiac medications before your next appointment, please call your pharmacy*  Lab Work: None ordered If you have labs (blood work) drawn today and your tests are completely normal, you will receive your results only by: MyChart Message (if you have MyChart) OR A paper copy in the mail If you have any lab test that is abnormal or we need to change your treatment, we will call you to review the results.  Testing/Procedures: Echocardiogram  Follow-Up: At [facility], you and your health needs are our priority.  As part of our continuing mission to provide you with exceptional heart care, our providers are all part of one team.  This team includes your primary Cardiologist (physician) and Advanced Practice Providers or APPs (Physician Assistants and Nurse Practitioners) who all work together to provide you with the care you need, when you need it.  Your next appointment:   6 month(s)  Provider:   Lonni LITTIE Nanas, MD    We recommend signing up for the patient portal called MyChart.  Sign up information is provided on this After Visit Summary.  MyChart is used to connect with patients for Virtual Visits (Telemedicine).  Patients are able to view lab/test results, encounter notes, upcoming appointments, etc.  Non-urgent messages can be sent to your provider as well.   To learn more about what  you can do with MyChart, go to [email or web address].   Other Instructions Your physician has requested that you have an echocardiogram. Echocardiography is a painless test that uses sound waves to create images of your heart. It provides your doctor with information about the size and shape of your heart and how well your hearts chambers and valves are  working. This procedure takes approximately one hour. There are no restrictions for this procedure. Please do NOT wear cologne, perfume, aftershave, or lotions (deodorant is allowed). Please arrive 15 minutes prior to your appointment time.  Please note: We ask at that you not bring children with you during ultrasound (echo/ vascular) testing. Due to room size and safety concerns, children are not allowed in the ultrasound rooms during exams. Our front office staff cannot provide observation of children in our lobby area while testing is being conducted. An adult accompanying a patient to their appointment will only be allowed in the ultrasound room at the discretion of the ultrasound technician under special circumstances. We apologize for any inconvenience.            Signed, Lonni LITTIE Nanas, MD  06/09/2024 8:40 AM    [facility] "

## 2024-06-09 ENCOUNTER — Inpatient Hospital Stay: Admitting: Hematology and Oncology

## 2024-06-09 ENCOUNTER — Other Ambulatory Visit: Payer: Self-pay | Admitting: Nurse Practitioner

## 2024-06-09 ENCOUNTER — Ambulatory Visit: Attending: Cardiology | Admitting: Cardiology

## 2024-06-09 ENCOUNTER — Inpatient Hospital Stay: Attending: Hematology and Oncology

## 2024-06-09 VITALS — BP 124/54 | HR 64 | Ht 74.0 in | Wt 213.0 lb

## 2024-06-09 VITALS — BP 138/69 | HR 51 | Temp 98.4°F | Resp 14 | Wt 213.0 lb

## 2024-06-09 DIAGNOSIS — I255 Ischemic cardiomyopathy: Secondary | ICD-10-CM | POA: Diagnosis not present

## 2024-06-09 DIAGNOSIS — I2510 Atherosclerotic heart disease of native coronary artery without angina pectoris: Secondary | ICD-10-CM | POA: Diagnosis not present

## 2024-06-09 DIAGNOSIS — Z87891 Personal history of nicotine dependence: Secondary | ICD-10-CM | POA: Insufficient documentation

## 2024-06-09 DIAGNOSIS — Z87442 Personal history of urinary calculi: Secondary | ICD-10-CM | POA: Insufficient documentation

## 2024-06-09 DIAGNOSIS — I252 Old myocardial infarction: Secondary | ICD-10-CM | POA: Insufficient documentation

## 2024-06-09 DIAGNOSIS — D696 Thrombocytopenia, unspecified: Secondary | ICD-10-CM | POA: Diagnosis not present

## 2024-06-09 DIAGNOSIS — I4891 Unspecified atrial fibrillation: Secondary | ICD-10-CM | POA: Diagnosis not present

## 2024-06-09 DIAGNOSIS — F32A Depression, unspecified: Secondary | ICD-10-CM | POA: Diagnosis not present

## 2024-06-09 DIAGNOSIS — Z801 Family history of malignant neoplasm of trachea, bronchus and lung: Secondary | ICD-10-CM | POA: Insufficient documentation

## 2024-06-09 DIAGNOSIS — Z79899 Other long term (current) drug therapy: Secondary | ICD-10-CM | POA: Insufficient documentation

## 2024-06-09 DIAGNOSIS — I5042 Chronic combined systolic (congestive) and diastolic (congestive) heart failure: Secondary | ICD-10-CM | POA: Diagnosis not present

## 2024-06-09 DIAGNOSIS — I480 Paroxysmal atrial fibrillation: Secondary | ICD-10-CM

## 2024-06-09 DIAGNOSIS — E785 Hyperlipidemia, unspecified: Secondary | ICD-10-CM | POA: Diagnosis not present

## 2024-06-09 DIAGNOSIS — Z8673 Personal history of transient ischemic attack (TIA), and cerebral infarction without residual deficits: Secondary | ICD-10-CM | POA: Diagnosis not present

## 2024-06-09 DIAGNOSIS — Z7901 Long term (current) use of anticoagulants: Secondary | ICD-10-CM | POA: Insufficient documentation

## 2024-06-09 DIAGNOSIS — I509 Heart failure, unspecified: Secondary | ICD-10-CM | POA: Diagnosis not present

## 2024-06-09 DIAGNOSIS — K7460 Unspecified cirrhosis of liver: Secondary | ICD-10-CM | POA: Diagnosis not present

## 2024-06-09 DIAGNOSIS — I10 Essential (primary) hypertension: Secondary | ICD-10-CM

## 2024-06-09 LAB — CMP (CANCER CENTER ONLY)
ALT: 16 U/L (ref 0–44)
AST: 22 U/L (ref 15–41)
Albumin: 4.7 g/dL (ref 3.5–5.0)
Alkaline Phosphatase: 102 U/L (ref 38–126)
Anion gap: 11 (ref 5–15)
BUN: 11 mg/dL (ref 8–23)
CO2: 25 mmol/L (ref 22–32)
Calcium: 9.4 mg/dL (ref 8.9–10.3)
Chloride: 107 mmol/L (ref 98–111)
Creatinine: 1.11 mg/dL (ref 0.61–1.24)
GFR, Estimated: >60 mL/min
Glucose, Bld: 92 mg/dL (ref 70–99)
Potassium: 4.3 mmol/L (ref 3.5–5.1)
Sodium: 143 mmol/L (ref 135–145)
Total Bilirubin: 0.6 mg/dL (ref 0.0–1.2)
Total Protein: 7.5 g/dL (ref 6.5–8.1)

## 2024-06-09 LAB — HEPATITIS B SURFACE ANTIBODY,QUALITATIVE: Hep B S Ab: NONREACTIVE

## 2024-06-09 LAB — CBC WITH DIFFERENTIAL (CANCER CENTER ONLY)
Abs Immature Granulocytes: 0.01 K/uL (ref 0.00–0.07)
Basophils Absolute: 0.1 K/uL (ref 0.0–0.1)
Basophils Relative: 1 %
Eosinophils Absolute: 0.1 K/uL (ref 0.0–0.5)
Eosinophils Relative: 3 %
HCT: 41.1 % (ref 39.0–52.0)
Hemoglobin: 14 g/dL (ref 13.0–17.0)
Immature Granulocytes: 0 %
Lymphocytes Relative: 39 %
Lymphs Abs: 1.5 K/uL (ref 0.7–4.0)
MCH: 32.3 pg (ref 26.0–34.0)
MCHC: 34.1 g/dL (ref 30.0–36.0)
MCV: 94.9 fL (ref 80.0–100.0)
Monocytes Absolute: 0.4 K/uL (ref 0.1–1.0)
Monocytes Relative: 11 %
Neutro Abs: 1.8 K/uL (ref 1.7–7.7)
Neutrophils Relative %: 46 %
Platelet Count: 132 K/uL — ABNORMAL LOW (ref 150–400)
RBC: 4.33 MIL/uL (ref 4.22–5.81)
RDW: 13 % (ref 11.5–15.5)
Smear Review: NORMAL
WBC Count: 3.9 K/uL — ABNORMAL LOW (ref 4.0–10.5)
nRBC: 0 % (ref 0.0–0.2)

## 2024-06-09 LAB — RETIC PANEL
Immature Retic Fract: 10.8 % (ref 2.3–15.9)
RBC.: 4.33 MIL/uL (ref 4.22–5.81)
Retic Count, Absolute: 62.4 K/uL (ref 19.0–186.0)
Retic Ct Pct: 1.4 % (ref 0.4–3.1)
Reticulocyte Hemoglobin: 36.1 pg

## 2024-06-09 LAB — VITAMIN B12: Vitamin B-12: 494 pg/mL (ref 180–914)

## 2024-06-09 LAB — WBC/PLT IN CITRATE

## 2024-06-09 LAB — HEPATITIS B SURFACE ANTIGEN: Hepatitis B Surface Ag: NONREACTIVE

## 2024-06-09 LAB — IMMATURE PLATELET FRACTION: Immature Platelet Fraction: 3.9 % (ref 1.2–8.6)

## 2024-06-09 LAB — HEPATITIS C ANTIBODY: HCV Ab: NONREACTIVE

## 2024-06-09 LAB — HIV ANTIBODY (ROUTINE TESTING W REFLEX): HIV Screen 4th Generation wRfx: NONREACTIVE

## 2024-06-09 LAB — FOLATE: Folate: 16.2 ng/mL

## 2024-06-09 LAB — LACTATE DEHYDROGENASE: LDH: 184 U/L (ref 105–235)

## 2024-06-09 NOTE — Patient Instructions (Signed)
 Medication Instructions:  Your physician recommends that you continue on your current medications as directed. Please refer to the Current Medication list given to you today.  *If you need a refill on your cardiac medications before your next appointment, please call your pharmacy*  Lab Work: None ordered If you have labs (blood work) drawn today and your tests are completely normal, you will receive your results only by: MyChart Message (if you have MyChart) OR A paper copy in the mail If you have any lab test that is abnormal or we need to change your treatment, we will call you to review the results.  Testing/Procedures: Echocardiogram  Follow-Up: At [facility], you and your health needs are our priority.  As part of our continuing mission to provide you with exceptional heart care, our providers are all part of one team.  This team includes your primary Cardiologist (physician) and Advanced Practice Providers or APPs (Physician Assistants and Nurse Practitioners) who all work together to provide you with the care you need, when you need it.  Your next appointment:   6 month(s)  Provider:   Lonni LITTIE Nanas, MD    We recommend signing up for the patient portal called MyChart.  Sign up information is provided on this After Visit Summary.  MyChart is used to connect with patients for Virtual Visits (Telemedicine).  Patients are able to view lab/test results, encounter notes, upcoming appointments, etc.  Non-urgent messages can be sent to your provider as well.   To learn more about what you can do with MyChart, go to [email or web address].   Other Instructions Your physician has requested that you have an echocardiogram. Echocardiography is a painless test that uses sound waves to create images of your heart. It provides your doctor with information about the size and shape of your heart and how well your hearts chambers and valves are working. This procedure takes  approximately one hour. There are no restrictions for this procedure. Please do NOT wear cologne, perfume, aftershave, or lotions (deodorant is allowed). Please arrive 15 minutes prior to your appointment time.  Please note: We ask at that you not bring children with you during ultrasound (echo/ vascular) testing. Due to room size and safety concerns, children are not allowed in the ultrasound rooms during exams. Our front office staff cannot provide observation of children in our lobby area while testing is being conducted. An adult accompanying a patient to their appointment will only be allowed in the ultrasound room at the discretion of the ultrasound technician under special circumstances. We apologize for any inconvenience.

## 2024-06-10 LAB — HEPATITIS B CORE ANTIBODY, TOTAL: HEP B CORE AB: NEGATIVE

## 2024-06-10 LAB — KAPPA/LAMBDA LIGHT CHAINS
Kappa free light chain: 26.2 mg/L — ABNORMAL HIGH (ref 3.3–19.4)
Kappa, lambda light chain ratio: 1.28 (ref 0.26–1.65)
Lambda free light chains: 20.5 mg/L (ref 5.7–26.3)

## 2024-06-10 LAB — METHYLMALONIC ACID, SERUM: Methylmalonic Acid, Quantitative: 148 nmol/L (ref 0–378)

## 2024-06-11 LAB — MULTIPLE MYELOMA PANEL, SERUM
Albumin SerPl Elph-Mcnc: 3.9 g/dL (ref 2.9–4.4)
Albumin/Glob SerPl: 1.4 (ref 0.7–1.7)
Alpha 1: 0.2 g/dL (ref 0.0–0.4)
Alpha2 Glob SerPl Elph-Mcnc: 0.7 g/dL (ref 0.4–1.0)
B-Globulin SerPl Elph-Mcnc: 0.9 g/dL (ref 0.7–1.3)
Gamma Glob SerPl Elph-Mcnc: 1.1 g/dL (ref 0.4–1.8)
Globulin, Total: 3 g/dL (ref 2.2–3.9)
IgA: 172 mg/dL (ref 61–437)
IgG (Immunoglobin G), Serum: 1176 mg/dL (ref 603–1613)
IgM (Immunoglobulin M), Srm: 30 mg/dL (ref 15–143)
Total Protein ELP: 6.9 g/dL (ref 6.0–8.5)

## 2024-06-11 MED ORDER — SPIRONOLACTONE 25 MG PO TABS: 12.5000 mg | ORAL_TABLET | Freq: Every day | ORAL | 3 refills | Status: AC

## 2024-06-12 ENCOUNTER — Ambulatory Visit: Payer: Self-pay | Admitting: Hematology and Oncology

## 2024-06-13 ENCOUNTER — Ambulatory Visit: Payer: PPO

## 2024-06-13 DIAGNOSIS — I255 Ischemic cardiomyopathy: Secondary | ICD-10-CM | POA: Diagnosis not present

## 2024-06-16 ENCOUNTER — Other Ambulatory Visit: Payer: Self-pay | Admitting: Nurse Practitioner

## 2024-06-16 LAB — CUP PACEART REMOTE DEVICE CHECK
Battery Remaining Longevity: 34 mo
Battery Remaining Percentage: 33 %
Battery Voltage: 2.89 V
Brady Statistic RV Percent Paced: 1 %
Date Time Interrogation Session: 20260109020017
HighPow Impedance: 66 Ohm
HighPow Impedance: 66 Ohm
Lead Channel Impedance Value: 330 Ohm
Lead Channel Pacing Threshold Amplitude: 1.25 V
Lead Channel Pacing Threshold Pulse Width: 0.6 ms
Lead Channel Sensing Intrinsic Amplitude: 11.7 mV
Lead Channel Setting Pacing Amplitude: 2.5 V
Lead Channel Setting Pacing Pulse Width: 0.6 ms
Lead Channel Setting Sensing Sensitivity: 0.5 mV
Pulse Gen Serial Number: 7402114

## 2024-06-17 ENCOUNTER — Ambulatory Visit (HOSPITAL_COMMUNITY)
Admission: RE | Admit: 2024-06-17 | Discharge: 2024-06-17 | Disposition: A | Discharge status: Home / Self Care | Source: Ambulatory Visit | Attending: Hematology and Oncology | Admitting: Hematology and Oncology

## 2024-06-17 DIAGNOSIS — D696 Thrombocytopenia, unspecified: Secondary | ICD-10-CM | POA: Insufficient documentation

## 2024-06-17 NOTE — Progress Notes (Signed)
 Remote ICD Transmission

## 2024-06-18 ENCOUNTER — Telehealth: Payer: Self-pay

## 2024-06-18 NOTE — Telephone Encounter (Signed)
 Contacted patient per MD request with message below. Patient note available. Left Dr. Lafonda message below on named voice mail. Advised patient to contact office for questions.

## 2024-06-18 NOTE — Telephone Encounter (Signed)
 Left message for patient to call back

## 2024-06-18 NOTE — Telephone Encounter (Signed)
-----   Message from Norleen Kidney, MD sent at 06/17/2024 12:24 PM EST ----- Please let Mike Cole know that his US  of the abdomen did show concern for possible cirrhosis of the liver. This could explain his low WBC and Plt. I have reached out to his GI doctor, Dr. Suzann to  have a look at the US .  Our workup did not show any additional concerning abnormalities.

## 2024-06-18 NOTE — Telephone Encounter (Signed)
-----   Message from Inocente Hausen, MD sent at 06/17/2024  2:59 PM EST ----- Regarding: Return clinic appointment Jay/Pod B -  @Jay  -thank you for your message regarding Mike Cole -happy to see him back in the office for cirrhosis evaluation  @Pod  B - please schedule Mike Cole for a return clinic visit with me or APP in the next 4 to 6 weeks for evaluation of cirrhosis  Thanks,  Inocente ----- Message ----- From: Federico Norleen ONEIDA MADISON, MD Sent: 06/17/2024  12:24 PM EST To: Inocente [name] Hausen, MD  Dr. Hausen,  I was referred Mike Cole for evaluation of thrombocytopenia/leukopenia. Our workup did include a US  of the liver, which shows concern for nodular surface consistent with cirrhosis. Would you be able to see him back for a cirrhosis workup?  Thanks,  [facility]  Hematology/Oncology

## 2024-06-19 NOTE — Telephone Encounter (Signed)
 Scheduled OV with patient for 3/5 at 3:20 pm with Dr. Suzann.

## 2024-06-22 ENCOUNTER — Ambulatory Visit: Payer: Self-pay | Admitting: Cardiology

## 2024-06-26 NOTE — Telephone Encounter (Signed)
 TC and left message to return call to Dr Lafonda office [phone] regarding his US  results.  Intent of call is to forward the following message from Dr Federico  Please let [name] know that his US  of the abdomen did show concern for possible cirrhosis of the liver. This could explain his low WBC and Plt. I have reached out to his GI doctor, Dr. Suzann to  have a look at the US .

## 2024-06-26 NOTE — Telephone Encounter (Signed)
-----   Message from Nurse Almarie DASEN, RN sent at 06/23/2024  5:31 PM EST -----  ----- Message ----- From: Gina Nena HERO, RN Sent: 06/18/2024   5:22 PM EST To: Almarie DELENA Arabia, RN  Dr. Federico sent 1/13. Attempted to contact him - LVM for him to call office  ----- Message ----- From: Federico Norleen DASEN MADISON, MD Sent: 06/17/2024  12:26 PM EST To: Nena HERO Gina, RN  Please let Mr. Slaven know that his US  of the abdomen did show concern for possible cirrhosis of the liver. This could explain his low WBC and Plt. I have reached out to his GI doctor, Dr. Suzann to  have a look at the US .  Our workup did not show any additional concerning abnormalities.

## 2024-06-27 ENCOUNTER — Telehealth: Payer: Self-pay

## 2024-06-27 NOTE — Telephone Encounter (Signed)
 Spoke with pt and relayed the following message  Please let Mike Cole know that his US  of the abdomen did show concern for possible cirrhosis of the liver. This could explain his low WBC and Plt. I have reached out to his GI doctor, Dr. Suzann to  have a look at the US .  Our workup did not show any additional concerning abnormalities.   Pt voiced an understanding and reported  [name] office called and made an appointment for him August 07, 2024.

## 2024-06-27 NOTE — Telephone Encounter (Signed)
-----   Message from Nurse Almarie DASEN, RN sent at 06/23/2024  5:31 PM EST -----  ----- Message ----- From: Gina Nena HERO, RN Sent: 06/18/2024   5:22 PM EST To: Almarie DELENA Arabia, RN  Dr. Federico sent 1/13. Attempted to contact him - LVM for him to call office  ----- Message ----- From: Federico Norleen DASEN MADISON, MD Sent: 06/17/2024  12:26 PM EST To: Nena HERO Gina, RN  Please let Mr. Yeates know that his US  of the abdomen did show concern for possible cirrhosis of the liver. This could explain his low WBC and Plt. I have reached out to his GI doctor, Dr. Suzann to  have a look at the US .  Our workup did not show any additional concerning abnormalities.

## 2024-06-27 NOTE — Telephone Encounter (Signed)
 Spoke with pt and relayed the following message  Please let [name] know that his US  of the abdomen did show concern for possible cirrhosis of the liver. This could explain his low WBC and Plt. I have reached out to his GI doctor, Dr. Suzann to  have a look at the US .  Our workup did not show any additional concerning abnormalities.  Pt voiced an understanding and reported  [name] office called and made an appointment for him August 07, 2024.

## 2024-06-27 NOTE — Telephone Encounter (Signed)
-----   Message from Nurse Almarie DASEN, RN sent at 06/23/2024  5:31 PM EST -----  ----- Message ----- From: Gina Nena HERO, RN Sent: 06/18/2024   5:22 PM EST To: Almarie DELENA Arabia, RN  Dr. Federico sent 1/13. Attempted to contact him - LVM for him to call office  ----- Message ----- From: Federico Norleen DASEN MADISON, MD Sent: 06/17/2024  12:26 PM EST To: Nena HERO Gina, RN  Please let Mr. Slaven know that his US  of the abdomen did show concern for possible cirrhosis of the liver. This could explain his low WBC and Plt. I have reached out to his GI doctor, Dr. Suzann to  have a look at the US .  Our workup did not show any additional concerning abnormalities.

## 2024-06-27 NOTE — Telephone Encounter (Signed)
 Spoke with pt and relayed the following message  Please let Mike Cole know that his US  of the abdomen did show concern for possible cirrhosis of the liver. This could explain his low WBC and Plt. I have reached out to his GI doctor, Dr. Suzann to  have a look at the US .  Our workup did not show any additional concerning abnormalities.  Pt voiced an understanding and reported  [name] office called and made an appointment for him August 07, 2024.

## 2024-07-06 DEATH — deceased

## 2024-07-10 ENCOUNTER — Ambulatory Visit (HOSPITAL_COMMUNITY)

## 2024-08-07 ENCOUNTER — Ambulatory Visit: Admitting: Pediatrics

## 2024-09-12 ENCOUNTER — Ambulatory Visit

## 2024-12-12 ENCOUNTER — Ambulatory Visit

## 2025-03-13 ENCOUNTER — Ambulatory Visit

## 2025-06-12 ENCOUNTER — Ambulatory Visit

## 2025-09-11 ENCOUNTER — Ambulatory Visit
